# Patient Record
Sex: Female | Born: 1960
Health system: Southern US, Community
[De-identification: ages and names within clinical notes are randomized; demographics above are authoritative.]

## PROBLEM LIST (undated history)

## (undated) DIAGNOSIS — F329 Major depressive disorder, single episode, unspecified: Secondary | ICD-10-CM

## (undated) DIAGNOSIS — K219 Gastro-esophageal reflux disease without esophagitis: Secondary | ICD-10-CM

## (undated) DIAGNOSIS — Z9889 Other specified postprocedural states: Secondary | ICD-10-CM

## (undated) DIAGNOSIS — O039 Complete or unspecified spontaneous abortion without complication: Secondary | ICD-10-CM

## (undated) DIAGNOSIS — K2270 Barrett's esophagus without dysplasia: Secondary | ICD-10-CM

## (undated) DIAGNOSIS — F419 Anxiety disorder, unspecified: Secondary | ICD-10-CM

## (undated) DIAGNOSIS — K279 Peptic ulcer, site unspecified, unspecified as acute or chronic, without hemorrhage or perforation: Secondary | ICD-10-CM

## (undated) DIAGNOSIS — K7689 Other specified diseases of liver: Secondary | ICD-10-CM

## (undated) DIAGNOSIS — F32A Depression, unspecified: Secondary | ICD-10-CM

## (undated) DIAGNOSIS — T7840XA Allergy, unspecified, initial encounter: Secondary | ICD-10-CM

## (undated) DIAGNOSIS — N289 Disorder of kidney and ureter, unspecified: Secondary | ICD-10-CM

## (undated) HISTORY — DX: Allergy, unspecified, initial encounter: T78.40XA

## (undated) HISTORY — PX: TONSILLECTOMY: SUR1361

## (undated) HISTORY — PX: BLADDER SUSPENSION: SHX72

## (undated) HISTORY — DX: Barrett's esophagus without dysplasia: K22.70

## (undated) HISTORY — DX: Major depressive disorder, single episode, unspecified: F32.9

## (undated) HISTORY — DX: Gastro-esophageal reflux disease without esophagitis: K21.9

## (undated) HISTORY — DX: Anxiety disorder, unspecified: F41.9

## (undated) HISTORY — DX: Other specified postprocedural states: Z98.890

## (undated) HISTORY — DX: Peptic ulcer, site unspecified, unspecified as acute or chronic, without hemorrhage or perforation: K27.9

## (undated) HISTORY — DX: Depression, unspecified: F32.A

## (undated) HISTORY — DX: Complete or unspecified spontaneous abortion without complication: O03.9

---
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
## 1997-08-11 ENCOUNTER — Ambulatory Visit (HOSPITAL_COMMUNITY): Admission: RE | Admit: 1997-08-11 | Discharge: 1997-08-11 | Payer: Self-pay | Admitting: Obstetrics and Gynecology

## 1999-05-04 ENCOUNTER — Encounter: Admission: RE | Admit: 1999-05-04 | Discharge: 1999-05-04 | Payer: Self-pay | Admitting: Gastroenterology

## 1999-05-04 ENCOUNTER — Encounter: Payer: Self-pay | Admitting: Gastroenterology

## 1999-09-30 ENCOUNTER — Encounter: Payer: Self-pay | Admitting: Emergency Medicine

## 1999-09-30 ENCOUNTER — Emergency Department (HOSPITAL_COMMUNITY): Admission: EM | Admit: 1999-09-30 | Discharge: 1999-09-30 | Payer: Self-pay | Admitting: Emergency Medicine

## 1999-10-04 ENCOUNTER — Ambulatory Visit (HOSPITAL_COMMUNITY): Admission: RE | Admit: 1999-10-04 | Discharge: 1999-10-04 | Payer: Self-pay | Admitting: Urology

## 1999-10-04 ENCOUNTER — Encounter: Payer: Self-pay | Admitting: Urology

## 1999-10-08 ENCOUNTER — Encounter: Payer: Self-pay | Admitting: Urology

## 1999-10-08 ENCOUNTER — Ambulatory Visit (HOSPITAL_COMMUNITY): Admission: RE | Admit: 1999-10-08 | Discharge: 1999-10-08 | Payer: Self-pay | Admitting: Urology

## 2000-09-10 ENCOUNTER — Other Ambulatory Visit: Admission: RE | Admit: 2000-09-10 | Discharge: 2000-09-10 | Payer: Self-pay | Admitting: Obstetrics and Gynecology

## 2001-10-08 ENCOUNTER — Other Ambulatory Visit: Admission: RE | Admit: 2001-10-08 | Discharge: 2001-10-08 | Payer: Self-pay | Admitting: Obstetrics and Gynecology

## 2002-03-25 HISTORY — PX: ABDOMINAL HYSTERECTOMY: SHX81

## 2002-10-07 ENCOUNTER — Ambulatory Visit (HOSPITAL_COMMUNITY): Admission: RE | Admit: 2002-10-07 | Discharge: 2002-10-07 | Payer: Self-pay | Admitting: Obstetrics and Gynecology

## 2002-10-07 ENCOUNTER — Encounter (INDEPENDENT_AMBULATORY_CARE_PROVIDER_SITE_OTHER): Payer: Self-pay | Admitting: Specialist

## 2002-10-12 ENCOUNTER — Other Ambulatory Visit: Admission: RE | Admit: 2002-10-12 | Discharge: 2002-10-12 | Payer: Self-pay | Admitting: Obstetrics and Gynecology

## 2003-05-19 ENCOUNTER — Observation Stay (HOSPITAL_COMMUNITY): Admission: RE | Admit: 2003-05-19 | Discharge: 2003-05-20 | Payer: Self-pay | Admitting: Obstetrics and Gynecology

## 2003-05-19 ENCOUNTER — Encounter (INDEPENDENT_AMBULATORY_CARE_PROVIDER_SITE_OTHER): Payer: Self-pay | Admitting: *Deleted

## 2003-12-20 ENCOUNTER — Other Ambulatory Visit: Admission: RE | Admit: 2003-12-20 | Discharge: 2003-12-20 | Payer: Self-pay | Admitting: Obstetrics and Gynecology

## 2005-01-08 ENCOUNTER — Other Ambulatory Visit: Admission: RE | Admit: 2005-01-08 | Discharge: 2005-01-08 | Payer: Self-pay | Admitting: Obstetrics and Gynecology

## 2007-02-17 ENCOUNTER — Encounter: Admission: RE | Admit: 2007-02-17 | Discharge: 2007-02-17 | Payer: Self-pay | Admitting: Neurology

## 2007-02-20 ENCOUNTER — Encounter: Admission: RE | Admit: 2007-02-20 | Discharge: 2007-02-20 | Payer: Self-pay | Admitting: Neurology

## 2007-07-15 ENCOUNTER — Ambulatory Visit (HOSPITAL_COMMUNITY): Admission: RE | Admit: 2007-07-15 | Discharge: 2007-07-15 | Payer: Self-pay | Admitting: Internal Medicine

## 2008-09-08 ENCOUNTER — Encounter: Admission: RE | Admit: 2008-09-08 | Discharge: 2008-09-08 | Payer: Self-pay | Admitting: Obstetrics and Gynecology

## 2010-08-10 NOTE — Op Note (Signed)
NAME:  Haley Sparks, Haley Sparks                       ACCOUNT NO.:  [id number]   MEDICAL RECORD NO.:  [id number]                   PATIENT TYPE:  OBV   LOCATION:  9399                                 FACILITY:  [facility]   PHYSICIAN:  Dineen Kid. Rana Snare, M.D.                 DATE OF BIRTH:  [birth year]   DATE OF PROCEDURE:  05/19/2003  DATE OF DISCHARGE:                                 OPERATIVE REPORT   PREOPERATIVE DIAGNOSES:  1. Menometrorrhagia.  2. Pelvic pain.  3. Dyspareunia.  4. History of endometriosis.   POSTOPERATIVE DIAGNOSES:  1. Menometrorrhagia.  2. Pelvic pain.  3. Dyspareunia.  4. History of endometriosis.   PROCEDURE:  Laparoscopically-assisted vaginal hysterectomy.   SURGEON:  Dineen Kid. Rana Snare, M.D.   ASSISTANT:  Tracie Harrier, M.D.   ANESTHESIA:  General endotracheal.   INDICATIONS:  Haley Sparks is a 50 year old G3, P2, A1, status post tubal  ligation, who has had worsening menometrorrhagia and cramping over the last  six months to a year.  She now has pain approximately two weeks before her  period, including severe cramping, and the patient also has heavy and  irregular periods and pain with intercourse.  She has had a history of  endometriosis in the past and has had laparoscopies for this.  Does have a  history of endometrial polyps and had a D&C for this in the past.  Has had a  recent sonohysterogram, which showed a normal endometrial lining, and has  been on blood pressure in hopes of controlling the pain and bleeding but has  been unresponsive.  She desires definitive surgical evaluation and  treatment, planned laparoscopically-assisted vaginal hysterectomy with  possible removal of one of her ovaries.  Risks and benefits were discussed  at length, which include but are not limited to risk of infection, bleeding,  damage, to bowel, bladder, ureters, ovaries, not being able to alleviate the  pain, or recurrence or worsening of the pain.  Risks associated with  anesthesia and risks associated with blood loss were also discussed.  Informed consent was obtained.   FINDINGS AT THE TIME OF SURGERY:  A normal-appearing uterus has been  suspended from the round ligaments from previous surgeries.  Minimal amount  of endometriosis along the right broad ligament.  Right and left ovaries  were within normal limits.  The cul-de-sac was also normal, as was the  liver.   DESCRIPTION OF PROCEDURE:  After adequate analgesia, the patient placed in  the dorsal lithotomy position, she was sterilely prepped and draped, the  bladder was sterilely drained.  A Hulka tenaculum was placed, a 1 cm  infraumbilical skin incision was made, the Veress needle was inserted, the  abdomen was insufflated to dullness to percussion.  An 11 mm trocar was  inserted.  A 5 mm trocar was inserted to the left of the midline two  fingerbreadths from the pubic symphysis.  The  above findings were noted.  A  Gyrus tripolar was used to coagulate across the round ligaments and the  utero-ovarian ligaments bilaterally, taking the broad ligaments down to the  edge of the bladder flap.  The bladder flap was created and dissected off  the anterior surface of the uterus.  The ovaries had fallen laterally away  from the uterus with good hemostasis achieved.  The areas of endometriosis  along the right broad ligament were desiccated in the process of dissecting  through the broad ligament along the right side.  The legs were  repositioned, a weighted speculum was placed.  The Jacobs tenaculum was  placed in the cervix.  A posterior colpotomy was performed.  The cervix was  circumscribed with Bovie cautery and a LigaSure ligator was used to clamp  the uterosacral ligaments bilaterally and resected with Mayo scissors.  The  bladder pillars were dissected bilaterally.  The bladder was dissected off  the anterior surface of the cervix and a Deaver retractor was placed below  the bladder.  Successive  bites across the cardinal ligaments up to the  uterine vasculature with the LigaSure were performed.  Mayo scissors were  used to dissect.  This was performed until the uterus was removed intact and  good hemostasis was achieved.  A ribbon packing was placed.  The uterosacral  ligaments were re-identified, suture ligated with 0 Monocryl figure-of-  eights.  The posterior peritoneum was closed in a pursestring fashion with 0  Monocryl suture and the posterior vaginal mucosa was closed in a vertical  fashion using figure-of-eights of 0 Monocryl.  The ribbon pack was removed  and the anterior vaginal mucosa was closed in a similar fashion with figure-  of-eights of 0 Monocryl in a vertical fashion with good approximation and  good hemostasis achieved.  The Foley catheter was placed with return of  clear yellow urine.  The legs were repositioned.  The abdomen was  reinsufflated and careful examination of the pelvis and cul-de-sac revealed  good hemostasis.  Some areas of peritoneal oozing were coagulated with the  bipolar and after hemostasis was assured, the abdomen was desufflated, the  trocars were removed.  The infraumbilical skin incision was closed with a 0  Vicryl interrupted suture in the fascia, 3-0 Vicryl Rapide subcuticular  stitch, and the 5 mm site was closed with 3-0 Vicryl Rapide subcuticular  stitch.  The incisions were injected with 0.25% Marcaine.  The patient was  stable on transfer to the recovery room.  The sponge and instrument count  was normal x3.  Estimated blood loss for the procedure was 100 mL.  The  patient received 1 g of Rocephin preoperatively.                                               Dineen Kid Rana Snare, M.D.    DCL/MEDQ  D:  05/19/2003  T:  05/19/2003  Job:  [id number]

## 2010-08-10 NOTE — H&P (Signed)
   NAME:  Haley Sparks, CAPELL                       ACCOUNT NO.:  [id number]   MEDICAL RECORD NO.:  [id number]                   PATIENT TYPE:  AMB   LOCATION:  SDC                                  FACILITY:  [facility]   PHYSICIAN:  Dineen Kid. Rana Snare, M.D.                 DATE OF BIRTH:  1960-07-10   DATE OF ADMISSION:  10/07/2002  DATE OF DISCHARGE:                                HISTORY & PHYSICAL   HISTORY OF PRESENT ILLNESS:  The patient is a 50 year old G3, P2; status  post tubal ligation.  She has been having problems with iron deficiency  anemia, also reporting that her periods over the last several months have  been heavy with multiple clots; lasting 5-7 days happening every 21-28 days.  Her hemoglobin was 9.3.  After discussing different options, she would  rather not be on birth control pills.   She underwent a sonohistogram.  The sonohistogram shows a probable  endometrial polyp measuring  4.7 mm in size at the fundus of the uterus.  She presents today for  hysteroscopy and D&C for evaluation and treatment of this.   PAST MEDICAL HISTORY:  1. History of endometriosis.  2. Hypertension.   PAST SURGICAL HISTORY:  1. Tubal ligation.  2. Tonsillectomy.  3. Several bladder surgeries.  4. Uterus extension.  5. Surgeries for endometriosis.  6. Cesarean section.   MEDICATIONS:  Currently on Wellbutrin and Xanax.   PHYSICAL EXAMINATION:  VITAL SIGNS:  Blood pressure 100/60.  HEART:  Regular rate and rhythm.  LUNGS:  Clear to auscultation bilaterally.  ABDOMEN:  Nondistended and nontender.  PELVIC:  The uterus is anteverted, mobile and nontender.  No adnexal masses  are palpable.   IMPRESSION AND PLAN:  Abnormal bleeding, anemia and endometrial mass  consistent with a polyp.  Recommend a hysteroscopy and D&C for evaluation  and removal of this polyp.  The risks and benefits were discussed at length,  and this included but not limited to: risks of infection, bleeding; damage  to  uterus, tubes, ovaries, bowel or bladder; possibility of recurrence of  the bleeding or not be able to ameliorate the bleeding.  She does confirm  informed consent.                                               Dineen Kid Rana Snare, M.D.   DCL/MEDQ  D:  10/07/2002  T:  10/07/2002  Job:  [id number]

## 2010-08-10 NOTE — H&P (Signed)
NAME:  Haley Sparks, Haley Sparks                       ACCOUNT NO.:  [id number]   MEDICAL RECORD NO.:  [id number]                   PATIENT TYPE:  OBV   LOCATION:  NA                                   [facility]   PHYSICIAN:  Dineen Kid. Rana Snare, M.D.                 DATE OF BIRTH:  27-Oct-1960   DATE OF ADMISSION:  DATE OF DISCHARGE:                                HISTORY & PHYSICAL   DATE OF ADMISSION:  May 19, 2003   HISTORY OF PRESENT ILLNESS:  Haley Sparks is a 50 year old G3 P2 A1 with  worsening menometrorrhagia and cramping.  She has pain approximately 2 weeks  before her period and severe cramping during her period.  Continues to have  heavy periods and also pain with intercourse.  She does have a history of  endometriosis and has had laparoscopies in the past for this.  She does have  a history of endometrial polyps and had a D&C for this in the past.  Recently started on birth control pills in hopes of controlling the pain and  bleeding but has been unresponsive to this.  At this time she is desiring  more definitive surgical treatment and presents for hysterectomy.  She also  has a history of iron deficiency anemia and currently on iron, has a  hemoglobin of 12.9.   PAST MEDICAL HISTORY:  Significant for history of endometriosis.  She has  had several laparoscopies for this.  She has also had several bladder  surgeries for urinary incontinence, a tubal ligation, tonsillectomy,  cesarean section, a uterine suspension, and also a D&C for endometrial  polyp.  Past medical history is supposedly history of hypertension; however,  she has had normal blood pressures for the last number of years.  She also  has a history of irritable bowel syndrome and anxiety.   MEDICATIONS:  Currently she is on Wellbutrin and Zelnorm.   ALLERGIES:  No known drug allergies.   PHYSICAL EXAMINATION:  VITAL SIGNS:  Blood pressure 110/70.  HEART:  Regular rate and rhythm.  LUNGS:  Clear to  auscultation bilaterally.  ABDOMEN:  Nondistended, nontender.  PELVIC:  The uterus is anteverted, mobile, nontender, and no adnexal masses  are palpable.   Ultrasound performed in May 2004 shows a 9.7 x 3.1 x 6.1 cm uterus, normal-  appearing ovaries.   IMPRESSION AND PLAN:  Menometrorrhagia, pelvic pain, dyspareunia, and  history of endometriosis not responsive to conservative medical management.  The patient desires definitive surgical treatment.   PLAN:  Hysterectomy.  Will plan laparoscopy-assisted vaginal hysterectomy  with probable unilateral salpingo-oophorectomy.  The patient has most of her  discomfort on the right side, desires right salpingo-oophorectomy if the  judgment at the time of surgery is that that would help her with the pain.  She does desire to preserve one ovary if at all possible.  The risks and  benefits of the procedure  were discussed at length which include but are not  limited to risk of infection; bleeding; damage to ureters, ovaries, bowel,  bladder; risks associated with anesthesia; risks associated with blood  transfusion.  She understands that if unable to achieve this vaginally that  laparotomy will be necessary.  She does understand if both ovaries needs to  be removed based on a decision made at surgery that she  would be menopausal and understands the risks associated with menopause as  well as hormone replacement therapy.  She understands the risks associated  with blood transfusion and also understands that the pain may or may not get  better after the surgery.  She does give her informed consent.                                               Dineen Kid Rana Snare, M.D.    DCL/MEDQ  D:  05/17/2003  T:  05/17/2003  Job:  [id number]

## 2010-08-10 NOTE — Op Note (Signed)
[place]  Patient:    Haley Sparks, Haley Sparks                    MRN: [id number] Proc. Date: 10/04/99 Adm. Date:  [id number] Disc. Date: [id number] Attending:  Katherine Roan                           Operative Report  PREOPERATIVE DIAGNOSIS:  Proximal left ureteral calculus 4 x 3 mm.  POSTOPERATIVE DIAGNOSIS:  Proximal left ureteral calculus 4 x 3 mm.  PROCEDURE:  Cystoureteral stent placement.  ANESTHESIA:  General anesthesia.  SURGEON:  Rozanna Boer., M.D.  INDICATIONS:  This 49 year old white female presented on July 10 with an obstructing left ureteral stone that has not progressed.  She was to have lithotripsy today, but the machine malfunctioned and the procedure had to be canceled.  Because of persistent pain, she enters now for cystostent placement nd before the lithotripsy could be done on July 17.  DESCRIPTION OF PROCEDURE:  The patient was placed on the operating table in the  dorsal lithotomy position after satisfactory induction of general endotracheal anesthesia, was prepped and draped with Betadine in the usual sterile fashion. A #21 panendoscope was inserted and the bladder was inspected and found to be free of any lesions.  The left orifice was catheterized with a 6 French x 24 cm left ureteral stent with the guide wire inserted through it, passed under fluoroscopy up to the level of the kidney where the stone was manipulated back into the renal pelvis.  The guide wire was removed, the stent was in good position, and the guide wire was then removed and the bladder drained.  The scope was then removed. The patient was taken to the recovery room and will be later discharged as an outpatient. DD:  10/04/99 TD:  10/04/99 Job: [id number]

## 2010-08-10 NOTE — Discharge Summary (Signed)
NAME:  Haley Sparks, Haley Sparks                       ACCOUNT NO.:  [id number]   MEDICAL RECORD NO.:  [id number]                   PATIENT TYPE:  OBV   LOCATION:  9316                                 FACILITY:  [facility]   PHYSICIAN:  Dineen Kid. Rana Snare, M.D.                 DATE OF BIRTH:  July 08, 1960   DATE OF ADMISSION:  05/19/2003  DATE OF DISCHARGE:                                 DISCHARGE SUMMARY   HISTORY OF PRESENT ILLNESS:  [name] is a 50 year old G3 P2 A1 with  worsening menometrorrhagia and pelvic pain, off and on bleeding which has  not been controlled with birth control pills.  She also has severe periods  of cramping for approximately 2 weeks before her period, continues through  her period, and also pain with intercourse.  Does have a history of  endometriosis, has no childbearing desires and presents for hysterectomy.   HOSPITAL COURSE:  The patient underwent a laparoscopy-assisted vaginal  hysterectomy.  Findings at the time of surgery were some mild endometriosis,  otherwise normal-appearing anatomy.  The uterus was easily removed with  estimated blood loss of 100 mL and the surgery was uncomplicated.  Her  postoperative care also was uncomplicated with good return of bowel function  and was ambulating well and tolerating a regular diet on postoperative day  #1.  The abdomen was soft, nontender, nondistended, with clean, dry, and  intact incisions and normal active bowel sounds.  The patient was discharged  home.  Will follow up in the office in 2-3 weeks.   DISPOSITION:  The patient will follow up in the office in 2-3 weeks.  Sent  home with a prescription for Tylox #30.  Told to return for increased pain,  fever, or bleeding.                                               Dineen Kid Rana Snare, M.D.    DCL/MEDQ  D:  05/20/2003  T:  05/20/2003  Job:  [id number]

## 2010-08-10 NOTE — Op Note (Signed)
NAME:  Haley Sparks, Haley Sparks                       ACCOUNT NO.:  [id number]   MEDICAL RECORD NO.:  [id number]                   PATIENT TYPE:  AMB   LOCATION:  SDC                                  [facility]ACILITY:  [facility]   PHYSICIAN:  Dineen Kid. Rana Snare, M.D.                 DATE OF BIRTH:  1960/05/09   DATE OF PROCEDURE:  10/07/2002  DATE OF DISCHARGE:                                 OPERATIVE REPORT   PREOPERATIVE DIAGNOSES:  1. Abnormal uterine bleeding.  2. Endometrial mass.   POSTOPERATIVE DIAGNOSES:  1. Abnormal uterine bleeding.  2. Endometrial mass.   PROCEDURE:  Hysteroscopy dilatation and curettage.   SURGEON:  Dineen Kid. Rana Snare, M.D.   ANESTHESIA:  Monitored anesthetic care and paracervical block.   ESTIMATED BLOOD LOSS:  Less than 10 mL.   SORBITOL DEFICIT:  30 mL.   HISTORY OF PRESENT ILLNESS:  Ms. Gallaga is a 50 year old G3, P2, A1, status  post tubal ligation with problems with iron deficiency anemia reporting  heavier menses of about several months, clotting, and irregular bleeding.  Underwent a  sonohysterogram which shows an endometrial polyp measuring 4.7  mm in size.  She presents for hysteroscopy D&C to evaluate lesion and  treatment of this.  Risks and benefits were discussed, informed consent was  obtained.   FINDINGS AT TIME OF SURGERY:  Posterior endometrial wall polyp.  Otherwise  normal appearing ostia and cervix.   DESCRIPTION OF PROCEDURE:  After adequate analgesia, the patient was placed  in the dorsal lithotomy position.  She was sterilely prepped and draped, her  bladder sterilely drained.  A speculum was placed.  Xylocaine 1% was  injected into the anterior lip of the cervix, a tenaculum was placed, and a  paracervical block was placed with 1% Xylocaine with 1:100,000 epinephrine  injected at 5 and 7 o'clock.  A total of 20 mL used.  The uterus was sounded  to 8 cm, easily dilated to a #27 Pratt dilator.  The hysteroscope was  inserted and the above  findings were noted.  Polyp forceps were used to  grasp the endometrial polyp and curettage was performed until a gritty  surface was felt throughout the endometrial cavity.  This was followed by  hysteroscopy revealing no residual polypoid appearing tissue and normal  appearing ostia and cervix.  The hysteroscope was then removed.  The  tenaculum removed from the anterior lip of the cervix and noted to be  hemostatic.  The speculum was then removed and the patient was transferred  to the recovery room in stable condition.  Estimated blood loss less than 10  mL.  Sorbitol deficit 30 mL.  The patient received Toradol 30 mg IV in the  operating room.    DISPOSITION:  The patient will be discharged home with follow up in the  office in 2-3 weeks and given routine instruction sheet for D&C.  Told to  return for increased pain, fever, or bleeding.                                               Dineen Kid Rana Snare, M.D.    DCL/MEDQ  D:  10/07/2002  T:  10/07/2002  Job:  [id number]

## 2010-08-29 ENCOUNTER — Other Ambulatory Visit (HOSPITAL_COMMUNITY): Payer: Self-pay | Admitting: Internal Medicine

## 2010-08-29 ENCOUNTER — Ambulatory Visit (HOSPITAL_COMMUNITY)
Admission: RE | Admit: 2010-08-29 | Discharge: 2010-08-29 | Disposition: A | Discharge status: Home / Self Care | Payer: BC Managed Care – PPO | Source: Ambulatory Visit | Attending: Internal Medicine | Admitting: Internal Medicine

## 2010-08-29 DIAGNOSIS — R059 Cough, unspecified: Secondary | ICD-10-CM | POA: Insufficient documentation

## 2010-08-29 DIAGNOSIS — R05 Cough: Secondary | ICD-10-CM | POA: Insufficient documentation

## 2010-08-29 DIAGNOSIS — R079 Chest pain, unspecified: Secondary | ICD-10-CM | POA: Insufficient documentation

## 2011-04-12 ENCOUNTER — Other Ambulatory Visit: Payer: Self-pay | Admitting: Obstetrics and Gynecology

## 2011-04-12 DIAGNOSIS — R928 Other abnormal and inconclusive findings on diagnostic imaging of breast: Secondary | ICD-10-CM

## 2011-04-12 LAB — HM MAMMOGRAPHY: HM Mammogram: NORMAL

## 2011-04-22 ENCOUNTER — Ambulatory Visit
Admission: RE | Admit: 2011-04-22 | Discharge: 2011-04-22 | Disposition: A | Discharge status: Home / Self Care | Payer: BC Managed Care – PPO | Source: Ambulatory Visit | Attending: Obstetrics and Gynecology | Admitting: Obstetrics and Gynecology

## 2011-04-22 DIAGNOSIS — R928 Other abnormal and inconclusive findings on diagnostic imaging of breast: Secondary | ICD-10-CM

## 2012-01-02 ENCOUNTER — Ambulatory Visit
Admission: RE | Admit: 2012-01-02 | Discharge: 2012-01-02 | Disposition: A | Discharge status: Home / Self Care | Payer: BC Managed Care – PPO | Source: Ambulatory Visit | Attending: Internal Medicine | Admitting: Internal Medicine

## 2012-01-02 ENCOUNTER — Other Ambulatory Visit: Payer: Self-pay | Admitting: Internal Medicine

## 2012-01-02 DIAGNOSIS — R609 Edema, unspecified: Secondary | ICD-10-CM

## 2012-01-02 DIAGNOSIS — R52 Pain, unspecified: Secondary | ICD-10-CM

## 2012-10-05 ENCOUNTER — Other Ambulatory Visit: Payer: Self-pay

## 2013-02-01 ENCOUNTER — Ambulatory Visit: Payer: BC Managed Care – PPO | Admitting: Physician Assistant

## 2013-02-01 ENCOUNTER — Encounter: Payer: Self-pay | Admitting: Physician Assistant

## 2013-02-01 VITALS — BP 128/70 | HR 88 | Temp 98.8°F | Resp 16 | Wt 137.0 lb

## 2013-02-01 DIAGNOSIS — R5381 Other malaise: Secondary | ICD-10-CM

## 2013-02-01 DIAGNOSIS — J329 Chronic sinusitis, unspecified: Secondary | ICD-10-CM

## 2013-02-01 LAB — CBC WITH DIFFERENTIAL/PLATELET
Basophils Absolute: 0 10*3/uL (ref 0.0–0.1)
Eosinophils Relative: 0 % (ref 0–5)
Lymphocytes Relative: 11 % — ABNORMAL LOW (ref 12–46)
Neutro Abs: 8.4 10*3/uL — ABNORMAL HIGH (ref 1.7–7.7)
Platelets: 250 10*3/uL (ref 150–400)
RDW: 12.8 % (ref 11.5–15.5)
WBC: 10.1 10*3/uL (ref 4.0–10.5)

## 2013-02-01 MED ORDER — BACLOFEN 10 MG PO TABS: 10.0000 mg | ORAL_TABLET | Freq: Three times a day (TID) | ORAL | Status: AC

## 2013-02-01 MED ORDER — AZITHROMYCIN 250 MG PO TABS: ORAL_TABLET | ORAL | Status: AC

## 2013-02-01 NOTE — Progress Notes (Signed)
  Subjective:    Patient ID: Haley Sparks, female    DOB: [birth year], 52 y.o.   MRN: [id number]  Sinusitis This is a new problem. The current episode started in the past 7 days. The problem has been gradually worsening since onset. Associated symptoms include congestion, ear pain, headaches (Frontal headache and gum pain) and sinus pressure. Pertinent negatives include no chills, coughing or diaphoresis. Treatments tried: When to urgent care on Friday and got two shots and Prednisone dose pack. The treatment provided no relief.     Medication List       This list is accurate as of: 02/01/13  3:28 PM.  Always use your most recent med list.               ALPRAZolam 0.5 MG tablet  Commonly known as:  XANAX  Take 0.5 mg by mouth at bedtime as needed for anxiety.     AMITIZA 24 MCG capsule  Generic drug:  lubiprostone  Take 24 mcg by mouth 2 (two) times daily with a meal.     azithromycin 250 MG tablet  Commonly known as:  ZITHROMAX  Take 2 tablets (500 mg) on  Day 1,  followed by 1 tablet (250 mg) once daily on Days 2 through 5.     baclofen 10 MG tablet  Commonly known as:  LIORESAL  Take 1 tablet (10 mg total) by mouth 3 (three) times daily.     buPROPion 150 MG 24 hr tablet  Commonly known as:  WELLBUTRIN XL  Take 150 mg by mouth 2 (two) times daily.     escitalopram 20 MG tablet  Commonly known as:  LEXAPRO  Take 20 mg by mouth daily.       No past medical history on file.   Review of Systems  Constitutional: Positive for fatigue. Negative for chills and diaphoresis.  HENT: Positive for congestion, ear pain and sinus pressure. Negative for facial swelling, hearing loss and postnasal drip.   Eyes: Negative.   Respiratory: Negative.  Negative for cough.   Cardiovascular: Negative.   Gastrointestinal: Negative.   Neurological: Positive for headaches (Frontal headache and gum pain). Negative for dizziness.       Objective:   Physical Exam  Constitutional: She  appears well-developed and well-nourished.  HENT:  Head: Normocephalic and atraumatic.  Right Ear: External ear normal. Tympanic membrane is not perforated, not erythematous and not retracted. A middle ear effusion is present.  Left Ear: External ear normal. Tympanic membrane is not perforated, not erythematous and not retracted. A middle ear effusion is present.  Nose: Sinus tenderness present.  Mouth/Throat: Uvula is midline and mucous membranes are normal. Posterior oropharyngeal erythema present. No posterior oropharyngeal edema or tonsillar abscesses.  + left TMJ tenderness.   Cardiovascular: Normal rate, regular rhythm and normal heart sounds.   Pulmonary/Chest: Effort normal and breath sounds normal.  Abdominal: Soft. Bowel sounds are normal.  Skin: Skin is warm and dry.       Assessment & Plan:   Fatigue- + sinus pressure- Patient will continue the prednisone, zpak given.  Will check labs to rule out anemia, dehydration, mono. Rapid Flu negative.   TMJ- left sided TMJ. Baclofen given, do soft foods.   Follow up if not better in 1 week.

## 2013-02-01 NOTE — Patient Instructions (Signed)

## 2013-02-02 LAB — BASIC METABOLIC PANEL WITH GFR
Chloride: 102 mEq/L (ref 96–112)
Creat: 0.64 mg/dL (ref 0.50–1.10)
GFR, Est Non African American: >89 mL/min
Potassium: 4.1 mEq/L (ref 3.5–5.3)

## 2013-02-02 LAB — HEPATIC FUNCTION PANEL
Albumin: 4.2 g/dL (ref 3.5–5.2)
Alkaline Phosphatase: 46 U/L (ref 39–117)
Total Bilirubin: 0.4 mg/dL (ref 0.3–1.2)

## 2013-02-02 LAB — EPSTEIN-BARR VIRUS VCA ANTIBODY PANEL
EBV EA IgG: <5 U/mL (ref <9.0)
EBV VCA IgG: 705 U/mL — ABNORMAL HIGH (ref <18.0)
EBV VCA IgM: <10 U/mL (ref <36.0)

## 2013-03-01 ENCOUNTER — Encounter: Payer: Self-pay | Admitting: Internal Medicine

## 2013-03-01 ENCOUNTER — Telehealth: Payer: Self-pay | Admitting: Internal Medicine

## 2013-03-01 DIAGNOSIS — F419 Anxiety disorder, unspecified: Secondary | ICD-10-CM | POA: Insufficient documentation

## 2013-03-01 DIAGNOSIS — F339 Major depressive disorder, recurrent, unspecified: Secondary | ICD-10-CM | POA: Insufficient documentation

## 2013-03-01 DIAGNOSIS — Z9109 Other allergy status, other than to drugs and biological substances: Secondary | ICD-10-CM | POA: Insufficient documentation

## 2013-03-01 DIAGNOSIS — F325 Major depressive disorder, single episode, in full remission: Secondary | ICD-10-CM | POA: Insufficient documentation

## 2013-03-01 NOTE — Telephone Encounter (Signed)
Pt said she finally went back to work,and is just misable with severe headache and inner ear pain, unbalanced feeling?  Please advise if ov needed.

## 2013-03-01 NOTE — Telephone Encounter (Signed)
Patient scheduled OV.

## 2013-03-01 NOTE — Telephone Encounter (Signed)
Suggest OV- should be feeling better with mono

## 2013-03-02 ENCOUNTER — Encounter: Payer: Self-pay | Admitting: Physician Assistant

## 2013-03-02 ENCOUNTER — Ambulatory Visit (INDEPENDENT_AMBULATORY_CARE_PROVIDER_SITE_OTHER): Payer: BC Managed Care – PPO | Admitting: Physician Assistant

## 2013-03-02 VITALS — BP 119/62 | HR 84 | Temp 98.1°F | Resp 16 | Wt 135.0 lb

## 2013-03-02 DIAGNOSIS — H8110 Benign paroxysmal vertigo, unspecified ear: Secondary | ICD-10-CM

## 2013-03-02 DIAGNOSIS — R5381 Other malaise: Secondary | ICD-10-CM

## 2013-03-02 DIAGNOSIS — Z79899 Other long term (current) drug therapy: Secondary | ICD-10-CM

## 2013-03-02 LAB — HEPATIC FUNCTION PANEL
AST: 19 U/L (ref 0–37)
Alkaline Phosphatase: 47 U/L (ref 39–117)
Bilirubin, Direct: 0.1 mg/dL (ref 0.0–0.3)
Indirect Bilirubin: 0.6 mg/dL (ref 0.0–0.9)
Total Bilirubin: 0.7 mg/dL (ref 0.3–1.2)

## 2013-03-02 LAB — BASIC METABOLIC PANEL WITH GFR
CO2: 27 mEq/L (ref 19–32)
Chloride: 103 mEq/L (ref 96–112)
Potassium: 4 mEq/L (ref 3.5–5.3)
Sodium: 137 mEq/L (ref 135–145)

## 2013-03-02 LAB — IRON AND TIBC
Iron: 145 ug/dL (ref 42–145)
TIBC: 289 ug/dL (ref 250–470)

## 2013-03-02 LAB — CBC WITH DIFFERENTIAL/PLATELET
Hemoglobin: 12.5 g/dL (ref 12.0–15.0)
Lymphocytes Relative: 42 % (ref 12–46)
Lymphs Abs: 2.6 10*3/uL (ref 0.7–4.0)
Monocytes Relative: 11 % (ref 3–12)
Neutrophils Relative %: 44 % (ref 43–77)
Platelets: 257 10*3/uL (ref 150–400)
RBC: 3.81 MIL/uL — ABNORMAL LOW (ref 3.87–5.11)

## 2013-03-02 LAB — MAGNESIUM: Magnesium: 2 mg/dL (ref 1.5–2.5)

## 2013-03-02 LAB — SEDIMENTATION RATE: Sed Rate: 4 mm/hr (ref 0–22)

## 2013-03-02 MED ORDER — MECLIZINE HCL 25 MG PO TABS: 25.0000 mg | ORAL_TABLET | Freq: Three times a day (TID) | ORAL | Status: DC | PRN

## 2013-03-02 MED ORDER — DIAZEPAM 5 MG PO TABS: 5.0000 mg | ORAL_TABLET | Freq: Three times a day (TID) | ORAL | Status: AC | PRN

## 2013-03-02 NOTE — Progress Notes (Signed)
HPI Patient presents for a one month follow up. She had a + mono at her last visit a month ago on 02/01/2013. She has been out of work since that time and she has been at work this week but states she is not feeling any better. She states that she continues to have fatigue, feels very swimmy headed/vertigo. When she walks she was to walk slowly because if she turns quickly she gets vertigo. She has waves of nausea, decreased appetitie. She has very tight neck and shoulder muscles. Denies fever, chills, vomiting, SOB, CP.   Past Medical History  Diagnosis Date  . Allergy   . Anxiety   . Depression   . History of bladder surgery      No Known Allergies    Current Outpatient Prescriptions on File Prior to Visit  Medication Sig Dispense Refill  . ALPRAZolam (XANAX) 0.5 MG tablet Take 0.5 mg by mouth at bedtime as needed for anxiety.      . baclofen (LIORESAL) 10 MG tablet Take 1 tablet (10 mg total) by mouth 3 (three) times daily.  30 tablet  1  . buPROPion (WELLBUTRIN XL) 150 MG 24 hr tablet Take 150 mg by mouth 2 (two) times daily.      . butalbital-acetaminophen-caffeine (FIORICET) 50-325-40 MG per tablet Take 2 tablets by mouth every 4 (four) hours as needed for headache.      . escitalopram (LEXAPRO) 20 MG tablet Take 20 mg by mouth daily.      Marland Kitchen lubiprostone (AMITIZA) 24 MCG capsule Take 24 mcg by mouth 2 (two) times daily with a meal.       No current facility-administered medications on file prior to visit.    ROS: all negative expect above.   Physical: Filed Weights   03/02/13 1038  Weight: 135 lb (61.236 kg)   Filed Vitals:   03/02/13 1038  BP: 119/62  Pulse: 84  Temp: 98.1 F (36.7 C)  Resp: 16   General Appearance: Well nourished, in no apparent distress. Eyes: PERRLA, EOMs. Sinuses: No Frontal/maxillary tenderness ENT/Mouth: Ext aud canals clear, normal light reflex with TMs without erythema, bulging. Post pharynx without erythema, swelling, exudate.   Respiratory: CTAB Cardio: RRR, no murmurs, rubs or gallops. Peripheral pulses brisk and equal bilaterally, without edema. No aortic or femoral bruits. Abdomen: Flat, soft, with bowl sounds. Nontender, no guarding, rebound. Lymphatics: Non tender without lymphadenopathy.  Musculoskeletal: Full ROM all peripheral extremities, 5/5 strength, and normal gait. Skin: Warm, dry without rashes, lesions, ecchymosis.  Neuro: Cranial nerves intact, reflexes equal bilaterally. Normal muscle tone, no cerebellar symptoms. Sensation intact. + Vertigo Pysch: Awake and oriented X 3, normal affect, Insight and Judgment appropriate.   Assessment and Plan: Fatigue- check labs  Vertigo- normal neurological exam Get labs, including urine to rule out other causes- questionably BPV Stop Xanax and try valium or meclizine.   Pending labs and disposition- will keep out of work until feeling better

## 2013-03-02 NOTE — Patient Instructions (Signed)
Infectious Mononucleosis Infectious mononucleosis (mono) is a common germ (viral) infection in children, teenagers, and young adults.  CAUSES  Mono is an infection caused by the Malachi Carl virus. The virus is spread by close personal contact with someone who has the infection. It can be passed by contact with your saliva through things such as kissing or sharing drinking glasses. Sometimes, the infection can be spread from someone who does not appear sick but still spreads the virus (asymptomatic carrier state).  SYMPTOMS  The most common symptoms of Mono are:  Sore throat.  Headache.  Fatigue.  Muscle aches.  Swollen glands.  Fever.  Poor appetite.  Enlarged liver or spleen. The less common symptoms can include:  Rash.  Feeling sick to your stomach (nauseous).  Abdominal pain. DIAGNOSIS  Mono is diagnosed by a blood test.  TREATMENT  Treatment of mono is usually at home. There is no medicine that cures this virus. Sometimes hospital treatment is needed in severe cases. Steroid medicine sometimes is needed if the swelling in the throat causes breathing or swallowing problems.  HOME CARE INSTRUCTIONS   Drink enough fluids to keep your urine clear or pale yellow.  Eat soft foods. Cool foods like popsicles or ice cream can soothe a sore throat.  Only take over-the-counter or prescription medicines for pain, discomfort, or fever as directed by your caregiver. Children under 22 years of age should not take aspirin.  Gargle salt water. This may help relieve your sore throat. Put 1 teaspoon (tsp) of salt in 1 cup of warm water. Sucking on hard candy may also help.  Rest as needed.  Start regular activities gradually after the fever is gone. Be sure to rest when tired.  Avoid strenuous exercise or contact sports until your caregiver says it is okay. The liver and spleen could be seriously injured.  Avoid sharing drinking glasses or kissing until your caregiver tells you  that you are no longer contagious. SEEK MEDICAL CARE IF:   Your fever is not gone after 7 days.  Your activity level is not back to normal after 2 weeks.  You have yellow coloring to eyes and skin (jaundice). SEEK IMMEDIATE MEDICAL CARE IF:   You have severe pain in the abdomen or shoulder.  You have trouble swallowing or drooling.  You have trouble breathing.  You develop a stiff neck.  You develop a severe headache.  You cannot stop throwing up (vomiting).  You have convulsions.  You are confused.  You have trouble with balance.  You develop signs of body fluid loss (dehydration):  Weakness.  Sunken eyes.  Pale skin.  Dry mouth.  Rapid breathing or pulse. MAKE SURE YOU:   Understand these instructions.  Will watch your condition.  Will get help right away if you are not doing well or get worse. Document Released: 03/08/2000 Document Revised: 06/03/2011 Document Reviewed: 01/05/2008 [facility] Patient Information 2014 [place], [place]. Vertigo Vertigo means you feel like you or your surroundings are moving when they are not. Vertigo can be dangerous if it occurs when you are at work, driving, or performing difficult activities.  CAUSES  Vertigo occurs when there is a conflict of signals sent to your brain from the visual and sensory systems in your body. There are many different causes of vertigo, including:  Infections, especially in the inner ear.  A bad reaction to a drug or misuse of alcohol and medicines.  Withdrawal from drugs or alcohol.  Rapidly changing positions, such as lying  down or rolling over in bed.  A migraine headache.  Decreased blood flow to the brain.  Increased pressure in the brain from a head injury, infection, tumor, or bleeding. SYMPTOMS  You may feel as though the world is spinning around or you are falling to the ground. Because your balance is upset, vertigo can cause nausea and vomiting. You may have involuntary eye  movements (nystagmus). DIAGNOSIS  Vertigo is usually diagnosed by physical exam. If the cause of your vertigo is unknown, your caregiver may perform imaging tests, such as an MRI scan (magnetic resonance imaging). TREATMENT  Most cases of vertigo resolve on their own, without treatment. Depending on the cause, your caregiver may prescribe certain medicines. If your vertigo is related to body position issues, your caregiver may recommend movements or procedures to correct the problem. In rare cases, if your vertigo is caused by certain inner ear problems, you may need surgery. HOME CARE INSTRUCTIONS   Follow your caregiver's instructions.  Avoid driving.  Avoid operating heavy machinery.  Avoid performing any tasks that would be dangerous to you or others during a vertigo episode.  Tell your caregiver if you notice that certain medicines seem to be causing your vertigo. Some of the medicines used to treat vertigo episodes can actually make them worse in some people. SEEK IMMEDIATE MEDICAL CARE IF:   Your medicines do not relieve your vertigo or are making it worse.  You develop problems with talking, walking, weakness, or using your arms, hands, or legs.  You develop severe headaches.  Your nausea or vomiting continues or gets worse.  You develop visual changes.  A family member notices behavioral changes.  Your condition gets worse. MAKE SURE YOU:  Understand these instructions.  Will watch your condition.  Will get help right away if you are not doing well or get worse. Document Released: 12/19/2004 Document Revised: 06/03/2011 Document Reviewed: 09/27/2010 [facility] Patient Information 2014 [place], [place].

## 2013-03-03 LAB — URINALYSIS, ROUTINE W REFLEX MICROSCOPIC
Glucose, UA: NEGATIVE mg/dL
Ketones, ur: NEGATIVE mg/dL
Nitrite: NEGATIVE
Specific Gravity, Urine: 1.006 (ref 1.005–1.030)
pH: 6.5 (ref 5.0–8.0)

## 2013-03-03 LAB — TSH: TSH: 1.451 u[IU]/mL (ref 0.350–4.500)

## 2013-03-04 ENCOUNTER — Encounter: Payer: Self-pay | Admitting: Physician Assistant

## 2013-03-04 LAB — URINE CULTURE: Organism ID, Bacteria: NO GROWTH

## 2013-03-10 ENCOUNTER — Other Ambulatory Visit: Payer: Self-pay | Admitting: Physician Assistant

## 2013-03-30 DIAGNOSIS — Z0000 Encounter for general adult medical examination without abnormal findings: Secondary | ICD-10-CM

## 2013-04-08 ENCOUNTER — Other Ambulatory Visit: Payer: Self-pay | Admitting: Physician Assistant

## 2013-04-20 ENCOUNTER — Ambulatory Visit: Payer: Self-pay | Admitting: Physician Assistant

## 2013-04-22 ENCOUNTER — Encounter: Payer: Self-pay | Admitting: Physician Assistant

## 2013-04-22 ENCOUNTER — Ambulatory Visit (INDEPENDENT_AMBULATORY_CARE_PROVIDER_SITE_OTHER): Payer: BC Managed Care – PPO | Admitting: Physician Assistant

## 2013-04-22 VITALS — BP 100/62 | HR 64 | Temp 99.0°F | Resp 16 | Ht 64.0 in | Wt 135.0 lb

## 2013-04-22 DIAGNOSIS — M76899 Other specified enthesopathies of unspecified lower limb, excluding foot: Secondary | ICD-10-CM

## 2013-04-22 DIAGNOSIS — M7070 Other bursitis of hip, unspecified hip: Secondary | ICD-10-CM

## 2013-04-22 DIAGNOSIS — K219 Gastro-esophageal reflux disease without esophagitis: Secondary | ICD-10-CM

## 2013-04-22 MED ORDER — MELOXICAM 15 MG PO TABS: ORAL_TABLET | ORAL | Status: DC

## 2013-04-22 MED ORDER — PREDNISONE 20 MG PO TABS: ORAL_TABLET | ORAL | Status: DC

## 2013-04-22 NOTE — Progress Notes (Signed)
   Subjective:    Patient ID: Haley Sparks, female    DOB: June 16, 1960, 53 y.o.   MRN: [id number]  HPI Bilateral hip pain, achy and pain, wakes her up at night, unable to sleep on them for the past month. She has been taking motrin and Advil for the pain which helps some. No radiation down her legs or paresthesias in her legs. Posterior/flank pain. She is very active and continues to walk with some pain but worse with sitting on it. She is recently getting over mono, still some fatigue/SOB from this. Worsening GERD symptoms and Tums use for one month, no black stools, diarrhea, constipation, AB pain.    Review of Systems  Constitutional: Positive for fatigue. Negative for fever and chills.  HENT: Negative.   Eyes: Negative.   Respiratory: Positive for shortness of breath. Negative for cough, chest tightness and wheezing.   Cardiovascular: Negative.   Gastrointestinal: Negative.   Genitourinary: Negative.   Musculoskeletal: Positive for myalgias.  Neurological: Negative.        Objective:   Physical Exam  Constitutional: She is oriented to person, place, and time. She appears well-developed and well-nourished.  HENT:  Head: Normocephalic and atraumatic.  Right Ear: External ear normal.  Left Ear: External ear normal.  Mouth/Throat: Oropharynx is clear and moist.  Eyes: Conjunctivae and EOM are normal. Pupils are equal, round, and reactive to light.  Neck: Normal range of motion. Neck supple. No thyromegaly present.  Cardiovascular: Normal rate, regular rhythm and normal heart sounds.  Exam reveals no gallop and no friction rub.   No murmur heard. Pulmonary/Chest: Effort normal and breath sounds normal. No respiratory distress. She has no wheezes.  Abdominal: Soft. Bowel sounds are normal. She exhibits no distension and no mass. There is no tenderness. There is no rebound and no guarding.  Musculoskeletal: Normal range of motion.  Full ROM bilateral hips, 5/5 strength, reflexes  intact, sensation intact distal with good cap refill. No bony tenderness along spine. Negative straight leg raise, no SI tenderness bilaterally. + tenderness bilaterally at greater trochanter.   Lymphadenopathy:    She has no cervical adenopathy.  Neurological: She is alert and oriented to person, place, and time. She displays normal reflexes. No cranial nerve deficit. Coordination normal.  Skin: Skin is warm and dry.  Psychiatric: She has a normal mood and affect.       Assessment & Plan:  Bursitis, hip  Meloxicam  Prednisone  RICE  If not better in one month will get Xrays and willing to do injections at that point GERD (gastroesophageal reflux disease)  Likely the cause of her SOB due to prednsione/NSAID use  Given voltern gel samples to use as well  Nexium given

## 2013-04-22 NOTE — Patient Instructions (Addendum)
Hip Bursitis Bursitis is a swelling and soreness (inflammation) of a fluid-filled sac (bursa). This sac overlies and protects the joints.  CAUSES   Injury.  Overuse of the muscles surrounding the joint.  Arthritis.  Gout.  Infection.  Cold weather.  Inadequate warm-up and conditioning prior to activities. The cause may not be known.  SYMPTOMS   Mild to severe irritation.  Tenderness and swelling over the outside of the hip.  Pain with motion of the hip.  If the bursa becomes infected, a fever may be present. Redness, tenderness, and warmth will develop over the hip. Symptoms usually lessen in 3 to 4 weeks with treatment, but can come back. TREATMENT If conservative treatment does not work, your caregiver may advise draining the bursa and injecting cortisone into the area. This may speed up the healing process. This may also be used as an initial treatment of choice. HOME CARE INSTRUCTIONS   Apply ice to the affected area for 15-20 minutes every 3 to 4 hours while awake for the first 2 days. Put the ice in a plastic bag and place a towel between the bag of ice and your skin.  Rest the painful joint as much as possible, but continue to put the joint through a normal range of motion at least 4 times per day. When the pain lessens, begin normal, slow movements and usual activities to help prevent stiffness of the hip.  Only take over-the-counter or prescription medicines for pain, discomfort, or fever as directed by your caregiver.  Use crutches to limit weight bearing on the hip joint, if advised.  Elevate your painful hip to reduce swelling. Use pillows for propping and cushioning your legs and hips.  Gentle massage may provide comfort and decrease swelling. SEEK IMMEDIATE MEDICAL CARE IF:   Your pain increases even during treatment, or you are not improving.  You have a fever.  You have heat and inflammation over the involved bursa.  You have any other questions or  concerns. MAKE SURE YOU:   Understand these instructions.  Will watch your condition.  Will get help right away if you are not doing well or get worse. Document Released: 08/31/2001 Document Revised: 06/03/2011 Document Reviewed: 03/30/2008 [facility] Patient Information 2014 [place], [place]. Diet for Gastroesophageal Reflux Disease, Adult Reflux (acid reflux) is when acid from your stomach flows up into the esophagus. When acid comes in contact with the esophagus, the acid causes irritation and soreness (inflammation) in the esophagus. When reflux happens often or so severely that it causes damage to the esophagus, it is called gastroesophageal reflux disease (GERD). Nutrition therapy can help ease the discomfort of GERD. FOODS OR DRINKS TO AVOID OR LIMIT  Smoking or chewing tobacco. Nicotine is one of the most potent stimulants to acid production in the gastrointestinal tract.  Caffeinated and decaffeinated coffee and black tea.  Regular or low-calorie carbonated beverages or energy drinks (caffeine-free carbonated beverages are allowed).   Strong spices, such as black pepper, white pepper, red pepper, cayenne, curry powder, and chili powder.  Peppermint or spearmint.  Chocolate.  High-fat foods, including meats and fried foods. Extra added fats including oils, butter, salad dressings, and nuts. Limit these to less than 8 tsp per day.  Fruits and vegetables if they are not tolerated, such as citrus fruits or tomatoes.  Alcohol.  Any food that seems to aggravate your condition. If you have questions regarding your diet, call your caregiver or a registered dietitian. OTHER THINGS THAT MAY HELP GERD  INCLUDE:   Eating your meals slowly, in a relaxed setting.  Eating 5 to 6 small meals per day instead of 3 large meals.  Eliminating food for a period of time if it causes distress.  Not lying down until 3 hours after eating a meal.  Keeping the head of your bed raised 6 to 9  inches (15 to 23 cm) by using a foam wedge or blocks under the legs of the bed. Lying flat may make symptoms worse.  Being physically active. Weight loss may be helpful in reducing reflux in overweight or obese adults.  Wear loose fitting clothing EXAMPLE MEAL PLAN This meal plan is approximately 2,000 calories based on [email or web address] meal planning guidelines. Breakfast   cup cooked oatmeal.  1 cup strawberries.  1 cup low-fat milk.  1 oz almonds. Snack  1 cup cucumber slices.  6 oz yogurt (made from low-fat or fat-free milk). Lunch  2 slice whole-wheat bread.  2 oz sliced [place].  2 tsp mayonnaise.  1 cup blueberries.  1 cup snap peas. Snack  6 whole-wheat crackers.  1 oz string cheese. Dinner   cup brown rice.  1 cup mixed veggies.  1 tsp olive oil.  3 oz grilled fish. Document Released: 03/11/2005 Document Revised: 06/03/2011 Document Reviewed: 01/25/2011 [facility] Patient Information 2014 [place], [place].

## 2013-04-29 ENCOUNTER — Other Ambulatory Visit: Payer: Self-pay | Admitting: Physician Assistant

## 2013-04-29 MED ORDER — MELOXICAM 15 MG PO TABS: ORAL_TABLET | ORAL | Status: DC

## 2013-06-15 ENCOUNTER — Ambulatory Visit (INDEPENDENT_AMBULATORY_CARE_PROVIDER_SITE_OTHER): Payer: BC Managed Care – PPO | Admitting: Physician Assistant

## 2013-06-15 ENCOUNTER — Encounter: Payer: Self-pay | Admitting: Physician Assistant

## 2013-06-15 VITALS — BP 108/62 | HR 76 | Temp 98.2°F | Resp 16 | Wt 135.0 lb

## 2013-06-15 DIAGNOSIS — J329 Chronic sinusitis, unspecified: Secondary | ICD-10-CM

## 2013-06-15 MED ORDER — DEXAMETHASONE SODIUM PHOSPHATE 100 MG/10ML IJ SOLN
10.0000 mg | Freq: Once | INTRAMUSCULAR | Status: AC
  Administered 2013-06-15: 10 mg via INTRAMUSCULAR

## 2013-06-15 MED ORDER — LEVOFLOXACIN 500 MG PO TABS: 500.0000 mg | ORAL_TABLET | Freq: Every day | ORAL | Status: DC

## 2013-06-15 MED ORDER — PROMETHAZINE-CODEINE 6.25-10 MG/5ML PO SYRP: 5.0000 mL | ORAL_SOLUTION | Freq: Four times a day (QID) | ORAL | Status: DC | PRN

## 2013-06-15 NOTE — Patient Instructions (Signed)

## 2013-06-15 NOTE — Progress Notes (Signed)
   Subjective:    Patient ID: Haley Sparks, female    DOB: [birth year], 53 y.o.   MRN: [id number]  Cough This is a new problem. Episode onset: 5 days. The problem has been gradually worsening. The cough is productive of purulent sputum. Associated symptoms include headaches, nasal congestion, postnasal drip, rhinorrhea, a sore throat and shortness of breath (with coughing). Pertinent negatives include no chest pain, chills, ear congestion, ear pain, fever, heartburn, hemoptysis, myalgias, rash, sweats, weight loss or wheezing. Treatments tried: had negative strep at [facility] on Saturday, zpak.    Review of Systems  Constitutional: Negative for fever, chills and weight loss.  HENT: Positive for postnasal drip, rhinorrhea and sore throat. Negative for ear pain.   Respiratory: Positive for cough and shortness of breath (with coughing). Negative for hemoptysis and wheezing.   Cardiovascular: Negative for chest pain.  Gastrointestinal: Negative for heartburn.  Musculoskeletal: Negative for myalgias.  Skin: Negative for rash.  Neurological: Positive for headaches.       Objective:   Physical Exam  Constitutional: She appears well-developed and well-nourished.  HENT:  Head: Normocephalic and atraumatic.  Right Ear: External ear normal.  Nose: Right sinus exhibits maxillary sinus tenderness. Right sinus exhibits no frontal sinus tenderness. Left sinus exhibits maxillary sinus tenderness. Left sinus exhibits no frontal sinus tenderness.  Eyes: Conjunctivae and EOM are normal.  Neck: Normal range of motion. Neck supple.  Cardiovascular: Normal rate, regular rhythm, normal heart sounds and intact distal pulses.   Pulmonary/Chest: Effort normal and breath sounds normal. No respiratory distress. She has no wheezes.  Abdominal: Soft. Bowel sounds are normal.  Lymphadenopathy:    She has cervical adenopathy.  Skin: Skin is warm and dry.      Assessment & Plan:  Sinusitis - Plan: levofloxacin  (LEVAQUIN) 500 MG tablet, promethazine-codeine (PHENERGAN WITH CODEINE) 6.25-10 MG/5ML syrup, dexamethasone (DECADRON) injection 10 mg

## 2013-06-18 ENCOUNTER — Other Ambulatory Visit: Payer: Self-pay | Admitting: Physician Assistant

## 2013-06-18 MED ORDER — PREDNISONE 20 MG PO TABS
ORAL_TABLET | ORAL | Status: DC
Start: 2013-06-18 — End: 2013-07-05

## 2013-06-18 MED ORDER — ALBUTEROL SULFATE HFA 108 (90 BASE) MCG/ACT IN AERS: 2.0000 | INHALATION_SPRAY | Freq: Four times a day (QID) | RESPIRATORY_TRACT | Status: DC | PRN

## 2013-06-18 MED ORDER — AZITHROMYCIN 250 MG PO TABS: ORAL_TABLET | ORAL | Status: DC

## 2013-06-21 ENCOUNTER — Other Ambulatory Visit: Payer: Self-pay | Admitting: Physician Assistant

## 2013-06-21 ENCOUNTER — Other Ambulatory Visit: Payer: Self-pay

## 2013-06-21 MED ORDER — HYDROCOD POLST-CHLORPHEN POLST 10-8 MG/5ML PO LQCR: 5.0000 mL | Freq: Every evening | ORAL | Status: DC | PRN

## 2013-07-05 ENCOUNTER — Ambulatory Visit (INDEPENDENT_AMBULATORY_CARE_PROVIDER_SITE_OTHER): Payer: BC Managed Care – PPO | Admitting: Physician Assistant

## 2013-07-05 ENCOUNTER — Encounter: Payer: Self-pay | Admitting: Physician Assistant

## 2013-07-05 VITALS — BP 100/62 | HR 88 | Temp 98.2°F | Resp 16 | Ht 64.0 in | Wt 138.0 lb

## 2013-07-05 DIAGNOSIS — R5381 Other malaise: Secondary | ICD-10-CM

## 2013-07-05 DIAGNOSIS — Z79899 Other long term (current) drug therapy: Secondary | ICD-10-CM

## 2013-07-05 DIAGNOSIS — L708 Other acne: Secondary | ICD-10-CM

## 2013-07-05 DIAGNOSIS — N951 Menopausal and female climacteric states: Secondary | ICD-10-CM

## 2013-07-05 DIAGNOSIS — D649 Anemia, unspecified: Secondary | ICD-10-CM

## 2013-07-05 DIAGNOSIS — R7309 Other abnormal glucose: Secondary | ICD-10-CM

## 2013-07-05 DIAGNOSIS — R5383 Other fatigue: Secondary | ICD-10-CM

## 2013-07-05 DIAGNOSIS — F3289 Other specified depressive episodes: Secondary | ICD-10-CM

## 2013-07-05 DIAGNOSIS — L709 Acne, unspecified: Secondary | ICD-10-CM

## 2013-07-05 DIAGNOSIS — I10 Essential (primary) hypertension: Secondary | ICD-10-CM

## 2013-07-05 DIAGNOSIS — F329 Major depressive disorder, single episode, unspecified: Secondary | ICD-10-CM

## 2013-07-05 DIAGNOSIS — F32A Depression, unspecified: Secondary | ICD-10-CM

## 2013-07-05 LAB — CBC WITH DIFFERENTIAL/PLATELET
Basophils Absolute: 0.1 10*3/uL (ref 0.0–0.1)
Basophils Relative: 1 % (ref 0–1)
EOS PCT: 4 % (ref 0–5)
Eosinophils Absolute: 0.3 10*3/uL (ref 0.0–0.7)
HCT: 35 % — ABNORMAL LOW (ref 36.0–46.0)
Hemoglobin: 12.2 g/dL (ref 12.0–15.0)
LYMPHS PCT: 28 % (ref 12–46)
Lymphs Abs: 1.8 10*3/uL (ref 0.7–4.0)
MCH: 32.9 pg (ref 26.0–34.0)
MCHC: 34.9 g/dL (ref 30.0–36.0)
MCV: 94.3 fL (ref 78.0–100.0)
Monocytes Absolute: 0.8 10*3/uL (ref 0.1–1.0)
Monocytes Relative: 12 % (ref 3–12)
NEUTROS PCT: 55 % (ref 43–77)
Neutro Abs: 3.5 10*3/uL (ref 1.7–7.7)
PLATELETS: 263 10*3/uL (ref 150–400)
RBC: 3.71 MIL/uL — AB (ref 3.87–5.11)
RDW: 13.3 % (ref 11.5–15.5)
WBC: 6.4 10*3/uL (ref 4.0–10.5)

## 2013-07-05 LAB — TSH: TSH: 1.593 u[IU]/mL (ref 0.350–4.500)

## 2013-07-05 LAB — BASIC METABOLIC PANEL WITH GFR
BUN: 10 mg/dL (ref 6–23)
CALCIUM: 9.2 mg/dL (ref 8.4–10.5)
CHLORIDE: 103 meq/L (ref 96–112)
CO2: 26 mEq/L (ref 19–32)
Creat: 0.53 mg/dL (ref 0.50–1.10)
GFR, Est African American: >89 mL/min
Glucose, Bld: 85 mg/dL (ref 70–99)
Potassium: 4.1 mEq/L (ref 3.5–5.3)
Sodium: 139 mEq/L (ref 135–145)

## 2013-07-05 LAB — HEPATIC FUNCTION PANEL
ALBUMIN: 4.1 g/dL (ref 3.5–5.2)
ALK PHOS: 43 U/L (ref 39–117)
ALT: 16 U/L (ref 0–35)
AST: 17 U/L (ref 0–37)
Bilirubin, Direct: 0.2 mg/dL (ref 0.0–0.3)
Indirect Bilirubin: 0.8 mg/dL (ref 0.2–1.2)
Total Bilirubin: 1 mg/dL (ref 0.2–1.2)
Total Protein: 6.3 g/dL (ref 6.0–8.3)

## 2013-07-05 LAB — FERRITIN: Ferritin: 30 ng/mL (ref 10–291)

## 2013-07-05 LAB — IRON AND TIBC
%SAT: 40 % (ref 20–55)
Iron: 125 ug/dL (ref 42–145)
TIBC: 314 ug/dL (ref 250–470)
UIBC: 189 ug/dL (ref 125–400)

## 2013-07-05 LAB — MAGNESIUM: MAGNESIUM: 1.7 mg/dL (ref 1.5–2.5)

## 2013-07-05 LAB — VITAMIN B12: VITAMIN B 12: 503 pg/mL (ref 211–911)

## 2013-07-05 MED ORDER — BUPROPION HCL ER (XL) 150 MG PO TB24: 150.0000 mg | ORAL_TABLET | ORAL | Status: DC

## 2013-07-05 MED ORDER — BENZOYL PEROXIDE-ERYTHROMYCIN 5-3 % EX GEL: Freq: Two times a day (BID) | CUTANEOUS | Status: DC

## 2013-07-05 MED ORDER — ESTRADIOL 1 MG PO TABS: ORAL_TABLET | ORAL | Status: DC

## 2013-07-05 MED ORDER — ALPRAZOLAM 0.5 MG PO TABS: ORAL_TABLET | ORAL | Status: DC

## 2013-07-05 NOTE — Progress Notes (Signed)
   Subjective:    Patient ID: Haley Sparks, female    DOB: 05/18/1960, 53 y.o.   MRN: [id number]  HPI 53 y.o. female with history of anxiety/depression presents with worsening fatigue. She had mono 2-3 months ago which she has not bounced back from however in the last 2-3 weeks she states it has been worse. She weaned herself off her lexapro and wellbutrin for 4 months.   She has been crying a lot, she has been sluggish, tired. Her daughter Claiborne Billings is in [place] with her husband and she worries about her constantly. She states she has been having hot flashes and she has been having weight gain with increasing acne. She has hot flashes worse at night or if she gets irritated she will having hot flashes.   Review of Systems  Constitutional: Positive for diaphoresis (hot flashes), activity change, appetite change, fatigue and unexpected weight change (weight gain). Negative for fever and chills.  HENT: Negative.   Respiratory: Negative.   Cardiovascular: Negative.  Negative for chest pain, palpitations and leg swelling.  Gastrointestinal: Negative.   Genitourinary: Negative.   Musculoskeletal: Negative.   Skin: Negative.   Neurological: Negative.   Hematological: Negative.   Psychiatric/Behavioral: Positive for sleep disturbance, dysphoric mood and decreased concentration. Negative for suicidal ideas, hallucinations, behavioral problems, confusion, self-injury and agitation. The patient is nervous/anxious. The patient is not hyperactive.        Objective:   Physical Exam  Constitutional: She is oriented to person, place, and time. She appears well-developed and well-nourished.  HENT:  Head: Normocephalic and atraumatic.  Right Ear: External ear normal.  Left Ear: External ear normal.  Mouth/Throat: Oropharynx is clear and moist.  Eyes: Conjunctivae and EOM are normal. Pupils are equal, round, and reactive to light.  Neck: Normal range of motion. Neck supple. No thyromegaly present.   Cardiovascular: Normal rate, regular rhythm and normal heart sounds.  Exam reveals no gallop and no friction rub.   No murmur heard. Pulmonary/Chest: Effort normal and breath sounds normal. No respiratory distress. She has no wheezes.  Abdominal: Soft. Bowel sounds are normal. She exhibits no distension and no mass. There is no tenderness. There is no rebound and no guarding.  Musculoskeletal: Normal range of motion.  Lymphadenopathy:    She has no cervical adenopathy.  Neurological: She is alert and oriented to person, place, and time. She displays normal reflexes. No cranial nerve deficit. Coordination normal.  Skin: Skin is warm and dry.      Assessment & Plan:  Fatigue- ? Menopause/depression/rule out anemia/infection/etc- will get back on wellbutin 150mg . + post menopausal symptoms. She has had a hysterectomy, we will start her on low dose estrogen. Risks of clotting and breast cancer were discussed and she understands. In addition, the cardiovascular,bone, and other benefits were discussed. She does not smoke. No personal history of breast CA or blood clots, she will be be on 81mg  aspirin and she will get mammograms regularly.  Start exercising again Check labs

## 2013-07-05 NOTE — Patient Instructions (Signed)
Menopause Menopause is the normal time of life when menstrual periods stop completely. Menopause is complete when you have missed 12 consecutive menstrual periods. It usually occurs between the ages of 8 years and 51 years. Very rarely does a woman develop menopause before the age of 51 years. At menopause, your ovaries stop producing the female hormones estrogen and progesterone. This can cause undesirable symptoms and also affect your health. Sometimes the symptoms may occur 4 5 years before the menopause begins. There is no relationship between menopause and:  Oral contraceptives.  Number of children you had.  Race.  The age your menstrual periods started (menarche). Heavy smokers and very thin women may develop menopause earlier in life. CAUSES  The ovaries stop producing the female hormones estrogen and progesterone.  Other causes include:  Surgery to remove both ovaries.  The ovaries stop functioning for no known reason.  Tumors of the pituitary gland in the brain.  Medical disease that affects the ovaries and hormone production.  Radiation treatment to the abdomen or pelvis.  Chemotherapy that affects the ovaries. SYMPTOMS   Hot flashes.  Night sweats.  Decrease in sex drive.  Vaginal dryness and thinning of the vagina causing painful intercourse.  Dryness of the skin and developing wrinkles.  Headaches.  Tiredness.  Irritability.  Memory problems.  Weight gain.  Bladder infections.  Hair growth of the face and chest.  Infertility. More serious symptoms include:  Loss of bone (osteoporosis) causing breaks (fractures).  Depression.  Hardening and narrowing of the arteries (atherosclerosis) causing heart attacks and strokes. DIAGNOSIS   When the menstrual periods have stopped for 12 straight months.  Physical exam.  Hormone studies of the blood. TREATMENT  There are many treatment choices and nearly as many questions about them. The  decisions to treat or not to treat menopausal changes is an individual choice made with your health care provider. Your health care provider can discuss the treatments with you. Together, you can decide which treatment will work best for you. Your treatment choices may include:   Hormone therapy (estrogen and progesterone).  Non-hormonal medicines.  Treating the individual symptoms with medicine (for example antidepressants for depression).  Herbal medicines that may help specific symptoms.  Counseling by a psychiatrist or psychologist.  Group therapy.  Lifestyle changes including:  Eating healthy.  Regular exercise.  Limiting caffeine and alcohol.  Stress management and meditation.  No treatment. HOME CARE INSTRUCTIONS   Take the medicine your health care provider gives you as directed.  Get plenty of sleep and rest.  Exercise regularly.  Eat a diet that contains calcium (good for the bones) and soy products (acts like estrogen hormone).  Avoid alcoholic beverages.  Do not smoke.  If you have hot flashes, dress in layers.  Take supplements, calcium, and vitamin D to strengthen bones.  You can use over-the-counter lubricants or moisturizers for vaginal dryness.  Group therapy is sometimes very helpful.  Acupuncture may be helpful in some cases. SEEK MEDICAL CARE IF:   You are not sure you are in menopause.  You are having menopausal symptoms and need advice and treatment.  You are still having menstrual periods after age 53 years.  You have pain with intercourse.  Menopause is complete (no menstrual period for 12 months) and you develop vaginal bleeding.  You need a referral to a specialist (gynecologist, psychiatrist, or psychologist) for treatment. SEEK IMMEDIATE MEDICAL CARE IF:   You have severe depression.  You have excessive vaginal bleeding.  You fell and think you have a broken bone.  You have pain when you urinate.  You develop leg or  chest pain.  You have a fast pounding heart beat (palpitations).  You have severe headaches.  You develop vision problems.  You feel a lump in your breast.  You have abdominal pain or severe indigestion. Document Released: 06/01/2003 Document Revised: 11/11/2012 Document Reviewed: 10/08/2012 [facility] Patient Information 2014 [place], [place].

## 2013-07-06 LAB — FOLATE RBC: RBC Folate: 686 ng/mL (ref >280)

## 2013-07-06 LAB — INSULIN, FASTING: Insulin fasting, serum: 6 u[IU]/mL (ref 3–28)

## 2013-08-02 ENCOUNTER — Encounter: Payer: Self-pay | Admitting: Internal Medicine

## 2013-08-02 ENCOUNTER — Encounter: Payer: Self-pay | Admitting: Physician Assistant

## 2013-08-02 ENCOUNTER — Ambulatory Visit (INDEPENDENT_AMBULATORY_CARE_PROVIDER_SITE_OTHER): Payer: BC Managed Care – PPO | Admitting: Physician Assistant

## 2013-08-02 VITALS — BP 110/70 | HR 80 | Temp 98.6°F | Resp 16 | Ht 64.0 in | Wt 136.0 lb

## 2013-08-02 DIAGNOSIS — F32A Depression, unspecified: Secondary | ICD-10-CM

## 2013-08-02 DIAGNOSIS — F329 Major depressive disorder, single episode, unspecified: Secondary | ICD-10-CM

## 2013-08-02 DIAGNOSIS — F3289 Other specified depressive episodes: Secondary | ICD-10-CM

## 2013-08-02 DIAGNOSIS — L255 Unspecified contact dermatitis due to plants, except food: Secondary | ICD-10-CM

## 2013-08-02 DIAGNOSIS — N951 Menopausal and female climacteric states: Secondary | ICD-10-CM

## 2013-08-02 MED ORDER — BUPROPION HCL ER (XL) 300 MG PO TB24: 300.0000 mg | ORAL_TABLET | ORAL | Status: DC

## 2013-08-02 MED ORDER — DEXAMETHASONE SODIUM PHOSPHATE 10 MG/ML IJ SOLN
10.0000 mg | Freq: Once | INTRAMUSCULAR | Status: AC
  Administered 2013-08-02: 10 mg via INTRAMUSCULAR

## 2013-08-02 NOTE — Progress Notes (Signed)
HPI 53 y.o.female presents for depression follow up. She was restarted on wellbutrin 150 and on estradiol 1 mg, she was on 1/2 for 1 week and is now on 1 pill for the past 3 weeks. Her hot flashes are better but she continue to cry randomly, eat often and has anxiety. She has started walking at lunch occ but is not as active as she has been in the past. Her daughter recently visited for a few days from [place] which she enjoyed but since she has left she has been very depressed.   Past Medical History  Diagnosis Date  . Allergy   . Anxiety   . Depression   . History of bladder surgery      No Known Allergies    Current Outpatient Prescriptions on File Prior to Visit  Medication Sig Dispense Refill  . albuterol (PROVENTIL HFA;VENTOLIN HFA) 108 (90 BASE) MCG/ACT inhaler Inhale 2 puffs into the lungs every 6 (six) hours as needed for wheezing or shortness of breath.  1 Inhaler  2  . ALPRAZolam (XANAX) 0.5 MG tablet take 1/2 to 1 tablet by mouth three times a day if needed  90 tablet  0  . baclofen (LIORESAL) 10 MG tablet Take 1 tablet (10 mg total) by mouth 3 (three) times daily.  30 tablet  1  . benzoyl peroxide-erythromycin (BENZAMYCIN) gel Apply topically 2 (two) times daily.  23.3 g  0  . buPROPion (WELLBUTRIN XL) 150 MG 24 hr tablet Take 1 tablet (150 mg total) by mouth every morning.  30 tablet  2  . diazepam (VALIUM) 5 MG tablet Take 1 tablet (5 mg total) by mouth every 8 (eight) hours as needed (vertigo).  60 tablet  0  . estradiol (ESTRACE) 1 MG tablet 1/2- 1 pill daily  30 tablet  1  . lubiprostone (AMITIZA) 24 MCG capsule Take 24 mcg by mouth 2 (two) times daily with a meal.      . meclizine (ANTIVERT) 25 MG tablet Take 1 tablet (25 mg total) by mouth 3 (three) times daily as needed for dizziness or nausea.  60 tablet  1  . meloxicam (MOBIC) 15 MG tablet Take one pill daily with food as needed for pain  30 tablet  3   No current facility-administered medications on file prior to  visit.    ROS: all negative expect above.   Physical: Filed Weights   08/02/13 1139  Weight: 136 lb (61.689 kg)   BP 110/70  Pulse 80  Temp(Src) 98.6 F (37 C)  Resp 16  Ht 5\' 4"  (1.626 m)  Wt 136 lb (61.689 kg)  BMI 23.33 kg/m2 General Appearance: Well nourished, in no apparent distress. Eyes: PERRLA, EOMs. Sinuses: No Frontal/maxillary tenderness ENT/Mouth: Ext aud canals clear, normal light reflex with TMs without erythema, bulging. Post pharynx without erythema, swelling, exudate.  Respiratory: CTAB Cardio: RRR, no murmurs, rubs or gallops. Peripheral pulses brisk and equal bilaterally, without edema. No aortic or femoral bruits. Abdomen: Soft, with bowl sounds. Nontender, no guarding, rebound. Lymphatics: Non tender without lymphadenopathy.  Musculoskeletal: Full ROM all peripheral extremities, 5/5 strength, and normal gait. Skin: Warm, dry without lesions, ecchymosis. Has linear vesicular rashes on bilateral arms.  Neuro: Cranial nerves intact, reflexes equal bilaterally. Normal muscle tone, no cerebellar symptoms. Sensation intact.  Pysch: Awake and oriented X 3, normal affect, Insight and Judgment appropriate.   Assessment and Plan: 1. Depression Increase wellbutrin to 300mg  QD, if this does not work after 2-3 weeks  go back to 150 and add on brintellix 10 samples that were given. Follow up in 6-8 weeks.   2. Menopause syndrome Continue estradiol and bASA  3. Dermatitis due to plants, including poison ivy, sumac, and oak - dexamethasone (DECADRON) injection 10 mg; Inject 1 mL (10 mg total) into the muscle once.

## 2013-08-02 NOTE — Patient Instructions (Signed)
Continue on the estrogen 1 mg daily Try the wellbutrin 300mg  daily for 2-3 weeks, if this does not help but this in half and take daily then get on brintellix 1 pill daily with the half of the wellbutrin for 3-4 weeks and follow up in the office.  Feel free to call at anytime or message Try to increase exercise or walking if just for a short while and not as intense as your are use to, just starting is half the battle.   Depression, Adult Depression refers to feeling sad, low, down in the dumps, blue, gloomy, or empty. In general, there are two kinds of depression: 1. Depression that we all experience from time to time because of upsetting life experiences, including the loss of a job or the ending of a relationship (normal sadness or normal grief). This kind of depression is considered normal, is short lived, and resolves within a few days to 2 weeks. (Depression experienced after the loss of a loved one is called bereavement. Bereavement often lasts longer than 2 weeks but normally gets better with time.) 2. Clinical depression, which lasts longer than normal sadness or normal grief or interferes with your ability to function at home, at work, and in school. It also interferes with your personal relationships. It affects almost every aspect of your life. Clinical depression is an illness. Symptoms of depression also can be caused by conditions other than normal sadness and grief or clinical depression. Examples of these conditions are listed as follows:  Physical illness Some physical illnesses, including underactive thyroid gland (hypothyroidism), severe anemia, specific types of cancer, diabetes, uncontrolled seizures, heart and lung problems, strokes, and chronic pain are commonly associated with symptoms of depression.  Side effects of some prescription medicine In some people, certain types of prescription medicine can cause symptoms of depression.  Substance abuse Abuse of alcohol and illicit  drugs can cause symptoms of depression. SYMPTOMS Symptoms of normal sadness and normal grief include the following:  Feeling sad or crying for short periods of time.  Not caring about anything (apathy).  Difficulty sleeping or sleeping too much.  No longer able to enjoy the things you used to enjoy.  Desire to be by oneself all the time (social isolation).  Lack of energy or motivation.  Difficulty concentrating or remembering.  Change in appetite or weight.  Restlessness or agitation. Symptoms of clinical depression include the same symptoms of normal sadness or normal grief and also the following symptoms:  Feeling sad or crying all the time.  Feelings of guilt or worthlessness.  Feelings of hopelessness or helplessness.  Thoughts of suicide or the desire to harm yourself (suicidal ideation).  Loss of touch with reality (psychotic symptoms). Seeing or hearing things that are not real (hallucinations) or having false beliefs about your life or the people around you (delusions and paranoia). DIAGNOSIS  The diagnosis of clinical depression usually is based on the severity and duration of the symptoms. Your caregiver also will ask you questions about your medical history and substance use to find out if physical illness, use of prescription medicine, or substance abuse is causing your depression. Your caregiver also may order blood tests. TREATMENT  Typically, normal sadness and normal grief do not require treatment. However, sometimes antidepressant medicine is prescribed for bereavement to ease the depressive symptoms until they resolve. The treatment for clinical depression depends on the severity of your symptoms but typically includes antidepressant medicine, counseling with a mental health professional, or a combination  of both. Your caregiver will help to determine what treatment is best for you. Depression caused by physical illness usually goes away with appropriate  medical treatment of the illness. If prescription medicine is causing depression, talk with your caregiver about stopping the medicine, decreasing the dose, or substituting another medicine. Depression caused by abuse of alcohol or illicit drugs abuse goes away with abstinence from these substances. Some adults need professional help in order to stop drinking or using drugs. SEEK IMMEDIATE CARE IF:  You have thoughts about hurting yourself or others.  You lose touch with reality (have psychotic symptoms).  You are taking medicine for depression and have a serious side effect. FOR MORE INFORMATION National Alliance on Mental Illness: www.nami.[employer]: https://carter.com/ Document Released: 03/08/2000 Document Revised: 09/10/2011 Document Reviewed: 06/10/2011 [facility] Patient Information 2014 [place].

## 2013-08-09 ENCOUNTER — Encounter: Payer: Self-pay | Admitting: Physician Assistant

## 2013-08-09 MED ORDER — PREDNISONE 20 MG PO TABS: ORAL_TABLET | ORAL | Status: DC

## 2013-08-19 MED ORDER — FLUOXETINE HCL 20 MG PO CAPS: 20.0000 mg | ORAL_CAPSULE | Freq: Every day | ORAL | Status: DC

## 2013-08-19 NOTE — Addendum Note (Signed)
Addended by: Vicie Mutters R on: 08/19/2013 10:01 AM   Modules accepted: Orders

## 2013-09-08 ENCOUNTER — Ambulatory Visit (INDEPENDENT_AMBULATORY_CARE_PROVIDER_SITE_OTHER): Payer: BC Managed Care – PPO | Admitting: Physician Assistant

## 2013-09-08 ENCOUNTER — Encounter: Payer: Self-pay | Admitting: Physician Assistant

## 2013-09-08 VITALS — BP 110/70 | HR 76 | Temp 98.0°F | Resp 16 | Ht 64.0 in | Wt 130.0 lb

## 2013-09-08 DIAGNOSIS — K5900 Constipation, unspecified: Secondary | ICD-10-CM

## 2013-09-08 DIAGNOSIS — F411 Generalized anxiety disorder: Secondary | ICD-10-CM

## 2013-09-08 DIAGNOSIS — F32A Depression, unspecified: Secondary | ICD-10-CM

## 2013-09-08 DIAGNOSIS — F329 Major depressive disorder, single episode, unspecified: Secondary | ICD-10-CM

## 2013-09-08 DIAGNOSIS — Z0000 Encounter for general adult medical examination without abnormal findings: Secondary | ICD-10-CM

## 2013-09-08 DIAGNOSIS — N951 Menopausal and female climacteric states: Secondary | ICD-10-CM

## 2013-09-08 LAB — CBC WITH DIFFERENTIAL/PLATELET
BASOS ABS: 0 10*3/uL (ref 0.0–0.1)
Basophils Relative: 1 % (ref 0–1)
EOS ABS: 0.1 10*3/uL (ref 0.0–0.7)
EOS PCT: 2 % (ref 0–5)
HCT: 35.7 % — ABNORMAL LOW (ref 36.0–46.0)
Hemoglobin: 12.5 g/dL (ref 12.0–15.0)
Lymphocytes Relative: 37 % (ref 12–46)
Lymphs Abs: 1.7 10*3/uL (ref 0.7–4.0)
MCH: 32.7 pg (ref 26.0–34.0)
MCHC: 35 g/dL (ref 30.0–36.0)
MCV: 93.5 fL (ref 78.0–100.0)
Monocytes Absolute: 0.6 10*3/uL (ref 0.1–1.0)
Monocytes Relative: 12 % (ref 3–12)
Neutro Abs: 2.3 10*3/uL (ref 1.7–7.7)
Neutrophils Relative %: 48 % (ref 43–77)
PLATELETS: 234 10*3/uL (ref 150–400)
RBC: 3.82 MIL/uL — ABNORMAL LOW (ref 3.87–5.11)
RDW: 13.4 % (ref 11.5–15.5)
WBC: 4.7 10*3/uL (ref 4.0–10.5)

## 2013-09-08 LAB — HEMOGLOBIN A1C
HEMOGLOBIN A1C: 5 % (ref <5.7)
MEAN PLASMA GLUCOSE: 97 mg/dL (ref <117)

## 2013-09-08 MED ORDER — ESTRADIOL 1 MG PO TABS: ORAL_TABLET | ORAL | Status: DC

## 2013-09-08 MED ORDER — FLUOXETINE HCL 40 MG PO CAPS: 40.0000 mg | ORAL_CAPSULE | Freq: Every day | ORAL | Status: DC

## 2013-09-08 MED ORDER — LINACLOTIDE 290 MCG PO CAPS: 290.0000 ug | ORAL_CAPSULE | Freq: Every day | ORAL | Status: DC

## 2013-09-08 MED ORDER — ALPRAZOLAM 0.5 MG PO TABS: ORAL_TABLET | ORAL | Status: DC

## 2013-09-08 NOTE — Progress Notes (Signed)
Complete Physical  Assessment and Plan: Allergy- controlled  Anxiety-cont medications  Depression- increase Prozac to 40, discussed symptoms of Serotonin syndrome  Osteopenia- get dexa, continue Vit D and Ca, weight bearing exercises Menopause- continue estrogen and add bASA Skin montior- 3x45mm uniform on lower center back Constipation- can try samples of Linzess  Discussed med's effects and SE's. Screening labs and tests as requested with regular follow-up as recommended.  HPI 53 y.o. female  presents for a complete physical. Her blood pressure has been controlled at home, today their BP is BP: 110/70 mmHg She does workout. She denies chest pain, shortness of breath, dizziness.  She is not on cholesterol medication and denies myalgias. Her cholesterol is at goal. The cholesterol last visit was:  LDL 118 Patient is on Vitamin D supplement, Vit D 61.  A1C 4.9 Patient has been having menopausal symptoms and was started on estrogen 3 months ago which has helped her hot flashes, she is not on a BASA yet as we discussed.  She has been dealing with depression/increasing anxiety since her daughter has gotten married and moved with her [place] husband to [place] approximately a year ago. Also her grandmother has fallen and she is in rehab and she is helping her mother take care of her. Last visit we increased her wellbutrin to 300mg  and she is on the prozac 20mg  but would like to try 40mg . She is going to the beach next week.  History of dysplastic venus with moderate atypia.   Current Medications:  Current Outpatient Prescriptions on File Prior to Visit  Medication Sig Dispense Refill  . albuterol (PROVENTIL HFA;VENTOLIN HFA) 108 (90 BASE) MCG/ACT inhaler Inhale 2 puffs into the lungs every 6 (six) hours as needed for wheezing or shortness of breath.  1 Inhaler  2  . ALPRAZolam (XANAX) 0.5 MG tablet take 1/2 to 1 tablet by mouth three times a day if needed  90 tablet  0  . baclofen (LIORESAL) 10  MG tablet Take 1 tablet (10 mg total) by mouth 3 (three) times daily.  30 tablet  1  . buPROPion (WELLBUTRIN XL) 300 MG 24 hr tablet Take 1 tablet (300 mg total) by mouth every morning.  30 tablet  2  . diazepam (VALIUM) 5 MG tablet Take 1 tablet (5 mg total) by mouth every 8 (eight) hours as needed (vertigo).  60 tablet  0  . estradiol (ESTRACE) 1 MG tablet 1/2- 1 pill daily  30 tablet  1  . FLUoxetine (PROZAC) 20 MG capsule Take 1 capsule (20 mg total) by mouth daily.  30 capsule  2  . meclizine (ANTIVERT) 25 MG tablet Take 1 tablet (25 mg total) by mouth 3 (three) times daily as needed for dizziness or nausea.  60 tablet  1  . meloxicam (MOBIC) 15 MG tablet Take one pill daily with food as needed for pain  30 tablet  3   No current facility-administered medications on file prior to visit.   Health Maintenance:   Immunization History  Administered Date(s) Administered  . Influenza Split 12/25/2012  . Tdap 09/04/2012   Tetanus: 08/2012 Pneumovax: n/A Flu vaccine: 2014 Zostavax: N/A Pap: She is going next month to Dr. Corinna Capra MGM: 07/2012 normal will get with Dr. Corinna Capra DEXA: 2013 osteopenia will get with Dr. Corinna Capra Colonoscopy: Aug 2014 Dr. [facility] due 5 years EGD: N/A Menses: TAH  Allergies: No Known Allergies Medical History:  Past Medical History  Diagnosis Date  .  Allergy   . Anxiety   . Depression   . History of bladder surgery    Surgical History:  Past Surgical History  Procedure Laterality Date  . Abdominal hysterectomy    . Cesarean section     Family History: No family history on file. Social History:  History  Substance Use Topics  . Smoking status: Never Smoker   . Smokeless tobacco: Not on file  . Alcohol Use: Not on file    Review of Systems: [X]  = complains of  [ ]  = denies  General: Fatigue [ ]  Fever [ ]  Chills [ ]  Weakness [ ]   Insomnia [ ] Weight change [ ]  Night sweats [ ]   Change in appetite [ ]  Eyes: Redness [ ]  Blurred  vision [ ]  Diplopia [ ]  Discharge [ ]   ENT: Congestion [ ]  Sinus Pain [ ]  Post Nasal Drip [ ]  Sore Throat [ ]  Earache [ ]  hearing loss [ ]  Tinnitus [ ]  Snoring [ ]   Cardiac: Chest pain/pressure [ ]  SOB [ ]  Orthopnea [ ]   Palpitations [ ]   Paroxysmal nocturnal dyspnea[ ]  Claudication [ ]  Edema [ ]   Pulmonary: Cough [ ]  Wheezing[ ]   SOB [ ]   Pleurisy [ ]   GI: Nausea [ ]  Vomiting[ ]  Dysphagia[ ]  Heartburn[ ]  Abdominal pain [ ]  Constipation Valu.Nieves ]; Diarrhea [ ]  BRBPR [ ]  Melena[ ]  Bloating [ ]  Hemorrhoids [ ]   GU: Hematuria[ ]  Dysuria [ ]  Nocturia[ ]  Urgency [ ]   Hesitancy [ ]  Discharge [ ]  Frequency [ ]   Breast:  Breast lumps [ ]   nipple discharge [ ]    Neuro: Headaches[ ]  Vertigo[ ]  Paresthesias[ ]  Spasm [ ]  Speech changes [ ]  Incoordination [ ]   Ortho: Arthritis [ ]  Joint pain [ ]  Muscle pain [ ]  Joint swelling [ ]  Back Pain [ ]  Skin:  Rash [ ]   Pruritis [ ]  Change in skin lesion [ ]   Psych: Depression[X ] Anxiety[X ] Confusion [ ]  Memory loss [ ]   Heme/Lypmh: Bleeding [ ]  Bruising [ ]  Enlarged lymph nodes [ ]   Endocrine: Visual blurring [ ]  Paresthesia [ ]  Polyuria [ ]  Polydypsea [ ]    Heat/cold intolerance [ ]  Hypoglycemia [ ]   Physical Exam: Estimated body mass index is 22.3 kg/(m^2) as calculated from the following:   Height as of this encounter: 5\' 4"  (1.626 m).   Weight as of this encounter: 130 lb (58.968 kg). BP 110/70  Pulse 76  Temp(Src) 98 F (36.7 C)  Resp 16  Ht 5\' 4"  (1.626 m)  Wt 130 lb (58.968 kg)  BMI 22.30 kg/m2 General Appearance: Well nourished, in no apparent distress. Eyes: PERRLA, EOMs, conjunctiva no swelling or erythema, normal fundi and vessels. Sinuses: No Frontal/maxillary tenderness ENT/Mouth: Ext aud canals clear, normal light reflex with TMs without erythema, bulging.  Good dentition. No erythema, swelling, or exudate on post pharynx. Tonsils not swollen or erythematous. Hearing normal.  Neck: Supple, thyroid normal. No bruits Respiratory: Respiratory  effort normal, BS equal bilaterally without rales, rhonchi, wheezing or stridor. Cardio: RRR without murmurs, rubs or gallops. Brisk peripheral pulses without edema.  Chest: symmetric, with normal excursions and percussion. Breasts: defer Abdomen: Soft, +BS. Non tender, no guarding, rebound, hernias, masses, or organomegaly. .  Lymphatics: Non tender without lymphadenopathy.  Genitourinary: defer Musculoskeletal: Full ROM all peripheral extremities,5/5 strength, and normal gait. Skin: Warm, dry without rashes, lesions, ecchymosis.  Neuro: Cranial nerves intact, reflexes equal bilaterally. Normal muscle tone, no  cerebellar symptoms. Sensation intact.  Psych: Awake and oriented X 3, normal affect, Insight and Judgment appropriate.   EKG: WNL no changes.  Vicie Mutters 9:17 AM

## 2013-09-08 NOTE — Addendum Note (Signed)
Addended by: Vicie Mutters R on: 09/08/2013 11:25 AM   Modules accepted: Level of Service

## 2013-09-08 NOTE — Patient Instructions (Signed)
Can increase prozac to 40mg  please monitor for symptoms of serotonin syndrome see below Please add on low dose 81mg  aspirin, take with food  Preventative Care for Adults - Female      MAINTAIN REGULAR HEALTH EXAMS:  A routine yearly physical is a good way to check in with your primary care provider about your health and preventive screening. It is also an opportunity to share updates about your health and any concerns you have, and receive a thorough all-over exam.   Most health insurance companies pay for at least some preventative services.  Check with your health plan for specific coverages.  WHAT PREVENTATIVE SERVICES DO WOMEN NEED?  Adult women should have their weight and blood pressure checked regularly.   Women age 71 and older should have their cholesterol levels checked regularly.  Women should be screened for cervical cancer with a Pap smear and pelvic exam beginning at either age 2, or 3 years after they become sexually activity.    Breast cancer screening generally begins at age 27 with a mammogram and breast exam by your primary care provider.    Beginning at age 2 and continuing to age 30, women should be screened for colorectal cancer.  Certain people may need continued testing until age 45.  Updating vaccinations is part of preventative care.  Vaccinations help protect against diseases such as the flu.  Osteoporosis is a disease in which the bones lose minerals and strength as we age. Women ages 61 and over should discuss this with their caregivers, as should women after menopause who have other risk factors.  Lab tests are generally done as part of preventative care to screen for anemia and blood disorders, to screen for problems with the kidneys and liver, to screen for bladder problems, to check blood sugar, and to check your cholesterol level.  Preventative services generally include counseling about diet, exercise, avoiding tobacco, drugs, excessive alcohol  consumption, and sexually transmitted infections.    GENERAL RECOMMENDATIONS FOR GOOD HEALTH:  Healthy diet:  Eat a variety of foods, including fruit, vegetables, animal or vegetable protein, such as meat, fish, chicken, and eggs, or beans, lentils, tofu, and grains, such as rice.  Drink plenty of water daily.  Decrease saturated fat in the diet, avoid lots of red meat, processed foods, sweets, fast foods, and fried foods.  Exercise:  Aerobic exercise helps maintain good heart health. At least 30-40 minutes of moderate-intensity exercise is recommended. For example, a brisk walk that increases your heart rate and breathing. This should be done on most days of the week.   Find a type of exercise or a variety of exercises that you enjoy so that it becomes a part of your daily life.  Examples are running, walking, swimming, water aerobics, and biking.  For motivation and support, explore group exercise such as aerobic class, spin class, Zumba, Yoga,or  martial arts, etc.    Set exercise goals for yourself, such as a certain weight goal, walk or run in a race such as a 5k walk/run.  Speak to your primary care provider about exercise goals.  Disease prevention:  If you smoke or chew tobacco, find out from your caregiver how to quit. It can literally save your life, no matter how long you have been a tobacco user. If you do not use tobacco, never begin.   Maintain a healthy diet and normal weight. Increased weight leads to problems with blood pressure and diabetes.   The Body Mass Index  or BMI is a way of measuring how much of your body is fat. Having a BMI above 27 increases the risk of heart disease, diabetes, hypertension, stroke and other problems related to obesity. Your caregiver can help determine your BMI and based on it develop an exercise and dietary program to help you achieve or maintain this important measurement at a healthful level.  High blood pressure causes heart and blood  vessel problems.  Persistent high blood pressure should be treated with medicine if weight loss and exercise do not work.   Fat and cholesterol leaves deposits in your arteries that can block them. This causes heart disease and vessel disease elsewhere in your body.  If your cholesterol is found to be high, or if you have heart disease or certain other medical conditions, then you may need to have your cholesterol monitored frequently and be treated with medication.   Ask if you should have a cardiac stress test if your history suggests this. A stress test is a test done on a treadmill that looks for heart disease. This test can find disease prior to there being a problem.  Menopause can be associated with physical symptoms and risks. Hormone replacement therapy is available to decrease these. You should talk to your caregiver about whether starting or continuing to take hormones is right for you.   Osteoporosis is a disease in which the bones lose minerals and strength as we age. This can result in serious bone fractures. Risk of osteoporosis can be identified using a bone density scan. Women ages 22 and over should discuss this with their caregivers, as should women after menopause who have other risk factors. Ask your caregiver whether you should be taking a calcium supplement and Vitamin D, to reduce the rate of osteoporosis.   Avoid drinking alcohol in excess (more than two drinks per day).  Avoid use of street drugs. Do not share needles with anyone. Ask for professional help if you need assistance or instructions on stopping the use of alcohol, cigarettes, and/or drugs.  Brush your teeth twice a day with fluoride toothpaste, and floss once a day. Good oral hygiene prevents tooth decay and gum disease. The problems can be painful, unattractive, and can cause other health problems. Visit your dentist for a routine oral and dental check up and preventive care every 6-12 months.   Look at your skin  regularly.  Use a mirror to look at your back. Notify your caregivers of changes in moles, especially if there are changes in shapes, colors, a size larger than a pencil eraser, an irregular border, or development of new moles.  Safety:  Use seatbelts 100% of the time, whether driving or as a passenger.  Use safety devices such as hearing protection if you work in environments with loud noise or significant background noise.  Use safety glasses when doing any work that could send debris in to the eyes.  Use a helmet if you ride a bike or motorcycle.  Use appropriate safety gear for contact sports.  Talk to your caregiver about gun safety.  Use sunscreen with a SPF (or skin protection factor) of 15 or greater.  Lighter skinned people are at a greater risk of skin cancer. Don't forget to also wear sunglasses in order to protect your eyes from too much damaging sunlight. Damaging sunlight can accelerate cataract formation.   Practice safe sex. Use condoms. Condoms are used for birth control and to help reduce the spread of sexually transmitted  infections (or STIs).  Some of the STIs are gonorrhea (the clap), chlamydia, syphilis, trichomonas, herpes, HPV (human papilloma virus) and HIV (human immunodeficiency virus) which causes AIDS. The herpes, HIV and HPV are viral illnesses that have no cure. These can result in disability, cancer and death.   Keep carbon monoxide and smoke detectors in your home functioning at all times. Change the batteries every 6 months or use a model that plugs into the wall.   Vaccinations:  Stay up to date with your tetanus shots and other required immunizations. You should have a booster for tetanus every 10 years. Be sure to get your flu shot every year, since 5%-20% of the [place] population comes down with the flu. The flu vaccine changes each year, so being vaccinated once is not enough. Get your shot in the fall, before the flu season peaks.   Other vaccines to  consider:  Human Papilloma Virus or HPV causes cancer of the cervix, and other infections that can be transmitted from person to person. There is a vaccine for HPV, and females should get immunized between the ages of 55 and 3. It requires a series of 3 shots.   Pneumococcal vaccine to protect against certain types of pneumonia.  This is normally recommended for adults age 21 or older.  However, adults younger than 51 years old with certain underlying conditions such as diabetes, heart or lung disease should also receive the vaccine.  Shingles vaccine to protect against Varicella Zoster if you are older than age 76, or younger than 51 years old with certain underlying illness.  Hepatitis A vaccine to protect against a form of infection of the liver by a virus acquired from food.  Hepatitis B vaccine to protect against a form of infection of the liver by a virus acquired from blood or body fluids, particularly if you work in health care.  If you plan to travel internationally, check with your local health department for specific vaccination recommendations.  Cancer Screening:  Breast cancer screening is essential to preventive care for women. All women age 21 and older should perform a breast self-exam every month. At age 23 and older, women should have their caregiver complete a breast exam each year. Women at ages 25 and older should have a mammogram (x-ray film) of the breasts. Your caregiver can discuss how often you need mammograms.    Cervical cancer screening includes taking a Pap smear (sample of cells examined under a microscope) from the cervix (end of the uterus). It also includes testing for HPV (Human Papilloma Virus, which can cause cervical cancer). Screening and a pelvic exam should begin at age 4, or 3 years after a woman becomes sexually active. Screening should occur every year, with a Pap smear but no HPV testing, up to age 67. After age 2, you should have a Pap smear every 3  years with HPV testing, if no HPV was found previously.   Most routine colon cancer screening begins at the age of 24. On a yearly basis, doctors may provide special easy to use take-home tests to check for hidden blood in the stool. Sigmoidoscopy or colonoscopy can detect the earliest forms of colon cancer and is life saving. These tests use a small camera at the end of a tube to directly examine the colon. Speak to your caregiver about this at age 99, when routine screening begins (and is repeated every 5 years unless early forms of pre-cancerous polyps or small growths are found).  Serotonin Syndrome Serotonin is a brain chemical that regulates the nervous system. Some kinds of drugs increase the amount of serotonin in your body. Drugs that increase the serotonin in your body include:   Anti-depressant medications.  St. John's wort.  Recreational drugs.  Migraine medicines.  Some pain medicines. SYMPTOMS Combining these drugs increases the risk that you will become ill with a toxic condition called serotonin syndrome.  Symptoms of too much serotonin include:  Confusion.  Agitation.  Weakness.  Insomnia.  Fever.  Sweats. Other symptoms that may develop include:  Shakiness.  Muscle spasms.  Seizures. TREATMENT  Hospital treatment is often needed until the effects are controlled.  Avoiding the combination of medicines listed above is recommended.  Check with your doctor if you are concerned about your medicine or the side effects. Document Released: 04/18/2004 Document Revised: 06/03/2011 Document Reviewed: 03/11/2005 [facility] Patient Information 2015 [place], [place]. This information is not intended to replace advice given to you by your health care provider. Make sure you discuss any questions you have with your health care provider.

## 2013-09-09 LAB — IRON AND TIBC
%SAT: 42 % (ref 20–55)
Iron: 116 ug/dL (ref 42–145)
TIBC: 276 ug/dL (ref 250–470)
UIBC: 160 ug/dL (ref 125–400)

## 2013-09-09 LAB — MICROALBUMIN / CREATININE URINE RATIO
Creatinine, Urine: 89.5 mg/dL
Microalb Creat Ratio: 10.7 mg/g (ref 0.0–30.0)
Microalb, Ur: 0.96 mg/dL (ref 0.00–1.89)

## 2013-09-09 LAB — LIPID PANEL
CHOLESTEROL: 200 mg/dL (ref 0–200)
HDL: 79 mg/dL (ref >39)
LDL Cholesterol: 109 mg/dL — ABNORMAL HIGH (ref 0–99)
Total CHOL/HDL Ratio: 2.5 Ratio
Triglycerides: 60 mg/dL (ref <150)
VLDL: 12 mg/dL (ref 0–40)

## 2013-09-09 LAB — INSULIN, FASTING: Insulin fasting, serum: 4 u[IU]/mL (ref 3–28)

## 2013-09-09 LAB — URINALYSIS, ROUTINE W REFLEX MICROSCOPIC
Bilirubin Urine: NEGATIVE
GLUCOSE, UA: NEGATIVE mg/dL
Hgb urine dipstick: NEGATIVE
Ketones, ur: NEGATIVE mg/dL
LEUKOCYTES UA: NEGATIVE
Nitrite: NEGATIVE
PROTEIN: NEGATIVE mg/dL
Specific Gravity, Urine: 1.012 (ref 1.005–1.030)
Urobilinogen, UA: 0.2 mg/dL (ref 0.0–1.0)
pH: 6 (ref 5.0–8.0)

## 2013-09-09 LAB — TSH: TSH: 0.689 u[IU]/mL (ref 0.350–4.500)

## 2013-09-09 LAB — BASIC METABOLIC PANEL WITH GFR
BUN: 10 mg/dL (ref 6–23)
CALCIUM: 9.2 mg/dL (ref 8.4–10.5)
CO2: 28 mEq/L (ref 19–32)
CREATININE: 0.69 mg/dL (ref 0.50–1.10)
Chloride: 100 mEq/L (ref 96–112)
GFR, Est African American: >89 mL/min
Glucose, Bld: 74 mg/dL (ref 70–99)
Potassium: 3.7 mEq/L (ref 3.5–5.3)
Sodium: 137 mEq/L (ref 135–145)

## 2013-09-09 LAB — HEPATIC FUNCTION PANEL
ALT: 10 U/L (ref 0–35)
AST: 14 U/L (ref 0–37)
Albumin: 4.1 g/dL (ref 3.5–5.2)
Alkaline Phosphatase: 35 U/L — ABNORMAL LOW (ref 39–117)
BILIRUBIN TOTAL: 0.8 mg/dL (ref 0.2–1.2)
Bilirubin, Direct: 0.2 mg/dL (ref 0.0–0.3)
Indirect Bilirubin: 0.6 mg/dL (ref 0.2–1.2)
TOTAL PROTEIN: 6.6 g/dL (ref 6.0–8.3)

## 2013-09-09 LAB — MAGNESIUM: Magnesium: 1.7 mg/dL (ref 1.5–2.5)

## 2013-09-09 LAB — VITAMIN B12: VITAMIN B 12: 432 pg/mL (ref 211–911)

## 2013-09-09 LAB — VITAMIN D 25 HYDROXY (VIT D DEFICIENCY, FRACTURES): VIT D 25 HYDROXY: 58 ng/mL (ref 30–89)

## 2013-12-06 ENCOUNTER — Other Ambulatory Visit: Payer: Self-pay | Admitting: Physician Assistant

## 2014-01-07 ENCOUNTER — Other Ambulatory Visit: Payer: Self-pay

## 2014-01-28 ENCOUNTER — Other Ambulatory Visit: Payer: Self-pay | Admitting: Physician Assistant

## 2014-04-08 ENCOUNTER — Encounter: Payer: Self-pay | Admitting: Physician Assistant

## 2014-04-08 MED ORDER — BUPROPION HCL ER (XL) 300 MG PO TB24: 300.0000 mg | ORAL_TABLET | Freq: Every morning | ORAL | Status: DC

## 2014-05-04 ENCOUNTER — Encounter: Payer: Self-pay | Admitting: Physician Assistant

## 2014-05-04 ENCOUNTER — Ambulatory Visit (INDEPENDENT_AMBULATORY_CARE_PROVIDER_SITE_OTHER): Payer: BLUE CROSS/BLUE SHIELD | Admitting: Physician Assistant

## 2014-05-04 VITALS — BP 110/70 | HR 80 | Temp 98.1°F | Resp 16 | Ht 64.0 in | Wt 128.0 lb

## 2014-05-04 DIAGNOSIS — F329 Major depressive disorder, single episode, unspecified: Secondary | ICD-10-CM

## 2014-05-04 DIAGNOSIS — E559 Vitamin D deficiency, unspecified: Secondary | ICD-10-CM

## 2014-05-04 DIAGNOSIS — R51 Headache: Secondary | ICD-10-CM

## 2014-05-04 DIAGNOSIS — M2660 Temporomandibular joint disorder, unspecified: Secondary | ICD-10-CM

## 2014-05-04 DIAGNOSIS — Z79899 Other long term (current) drug therapy: Secondary | ICD-10-CM

## 2014-05-04 DIAGNOSIS — K2100 Gastro-esophageal reflux disease with esophagitis, without bleeding: Secondary | ICD-10-CM

## 2014-05-04 DIAGNOSIS — M26609 Unspecified temporomandibular joint disorder, unspecified side: Secondary | ICD-10-CM

## 2014-05-04 DIAGNOSIS — R5383 Other fatigue: Secondary | ICD-10-CM

## 2014-05-04 DIAGNOSIS — R519 Headache, unspecified: Secondary | ICD-10-CM

## 2014-05-04 DIAGNOSIS — F419 Anxiety disorder, unspecified: Secondary | ICD-10-CM

## 2014-05-04 DIAGNOSIS — F32A Depression, unspecified: Secondary | ICD-10-CM

## 2014-05-04 LAB — BASIC METABOLIC PANEL WITH GFR
BUN: 12 mg/dL (ref 6–23)
CO2: 29 meq/L (ref 19–32)
Calcium: 9.4 mg/dL (ref 8.4–10.5)
Chloride: 102 mEq/L (ref 96–112)
Creat: 0.68 mg/dL (ref 0.50–1.10)
GFR, Est African American: >89 mL/min
GFR, Est Non African American: >89 mL/min
Glucose, Bld: 95 mg/dL (ref 70–99)
Potassium: 3.7 mEq/L (ref 3.5–5.3)
Sodium: 140 mEq/L (ref 135–145)

## 2014-05-04 LAB — HEPATIC FUNCTION PANEL
ALT: 13 U/L (ref 0–35)
AST: 17 U/L (ref 0–37)
Albumin: 4 g/dL (ref 3.5–5.2)
Alkaline Phosphatase: 49 U/L (ref 39–117)
BILIRUBIN INDIRECT: 0.3 mg/dL (ref 0.2–1.2)
BILIRUBIN TOTAL: 0.4 mg/dL (ref 0.2–1.2)
Bilirubin, Direct: 0.1 mg/dL (ref 0.0–0.3)
Total Protein: 6.6 g/dL (ref 6.0–8.3)

## 2014-05-04 LAB — CBC WITH DIFFERENTIAL/PLATELET
Basophils Absolute: 0.1 10*3/uL (ref 0.0–0.1)
Basophils Relative: 1 % (ref 0–1)
Eosinophils Absolute: 0.3 10*3/uL (ref 0.0–0.7)
Eosinophils Relative: 4 % (ref 0–5)
HCT: 37.7 % (ref 36.0–46.0)
HEMOGLOBIN: 13 g/dL (ref 12.0–15.0)
LYMPHS PCT: 28 % (ref 12–46)
Lymphs Abs: 1.8 10*3/uL (ref 0.7–4.0)
MCH: 33.6 pg (ref 26.0–34.0)
MCHC: 34.5 g/dL (ref 30.0–36.0)
MCV: 97.4 fL (ref 78.0–100.0)
MONO ABS: 1 10*3/uL (ref 0.1–1.0)
MONOS PCT: 15 % — AB (ref 3–12)
MPV: 9.1 fL (ref 8.6–12.4)
Neutro Abs: 3.4 10*3/uL (ref 1.7–7.7)
Neutrophils Relative %: 52 % (ref 43–77)
Platelets: 232 10*3/uL (ref 150–400)
RBC: 3.87 MIL/uL (ref 3.87–5.11)
RDW: 13 % (ref 11.5–15.5)
WBC: 6.5 10*3/uL (ref 4.0–10.5)

## 2014-05-04 LAB — LIPID PANEL
Cholesterol: 226 mg/dL — ABNORMAL HIGH (ref 0–200)
HDL: 82 mg/dL (ref >39)
LDL CALC: 116 mg/dL — AB (ref 0–99)
Total CHOL/HDL Ratio: 2.8 Ratio
Triglycerides: 142 mg/dL (ref <150)
VLDL: 28 mg/dL (ref 0–40)

## 2014-05-04 LAB — MAGNESIUM: MAGNESIUM: 1.9 mg/dL (ref 1.5–2.5)

## 2014-05-04 MED ORDER — DIAZEPAM 5 MG PO TABS: ORAL_TABLET | ORAL | Status: DC

## 2014-05-04 MED ORDER — CITALOPRAM HYDROBROMIDE 40 MG PO TABS: 40.0000 mg | ORAL_TABLET | Freq: Every day | ORAL | Status: DC

## 2014-05-04 NOTE — Patient Instructions (Addendum)
Cut wellbutrin in half, can stop prozac and start celexa 45m 1/2 pill immediately.  STOP GOODY POWDERS  Recommend that you see [facility]  Address: [address] [phone] Stress and Stress Management Stress is a normal reaction to life events. It is what you feel when life demands more than you are used to or more than you can handle. Some stress can be useful. For example, the stress reaction can help you catch the last bus of the day, study for a test, or meet a deadline at work. But stress that occurs too often or for too long can cause problems. It can affect your emotional health and interfere with relationships and normal daily activities. Too much stress can weaken your immune system and increase your risk for physical illness. If you already have a medical problem, stress can make it worse. CAUSES  All sorts of life events may cause stress. An event that causes stress for one person may not be stressful for another person. Major life events commonly cause stress. These may be positive or negative. Examples include losing your job, moving into a new home, getting married, having a baby, or losing a loved one. Less obvious life events may also cause stress, especially if they occur day after day or in combination. Examples include working long hours, driving in traffic, caring for children, being in debt, or being in a difficult relationship. SIGNS AND SYMPTOMS Stress may cause emotional symptoms including, the following:  Anxiety. This is feeling worried, afraid, on edge, overwhelmed, or out of control.  Anger. This is feeling irritated or impatient.  Depression. This is feeling sad, down, helpless, or guilty.  Difficulty focusing, remembering, or making decisions. Stress may cause physical symptoms, including the following:   Aches and pains. These may affect your head, neck, back, stomach, or other areas of your  body.  Tight muscles or clenched jaw.  Low energy or trouble sleeping. Stress may cause unhealthy behaviors, including the following:   Eating to feel better (overeating) or skipping meals.  Sleeping too little, too much, or both.  Working too much or putting off tasks (procrastination).  Smoking, drinking alcohol, or using drugs to feel better. DIAGNOSIS  Stress is diagnosed through an assessment by your health care provider. Your health care provider will ask questions about your symptoms and any stressful life events.Your health care provider will also ask about your medical history and may order blood tests or other tests. Certain medical conditions and medicine can cause physical symptoms similar to stress. Mental illness can cause emotional symptoms and unhealthy behaviors similar to stress. Your health care provider may refer you to a mental health professional for further evaluation.  TREATMENT  Stress management is the recommended treatment for stress.The goals of stress management are reducing stressful life events and coping with stress in healthy ways.  Techniques for reducing stressful life events include the following:  Stress identification. Self-monitor for stress and identify what causes stress for you. These skills may help you to avoid some stressful events.  Time management. Set your priorities, keep a calendar of events, and learn to say "no." These tools can help you avoid making too many commitments. Techniques for coping with stress include the following:  Rethinking the problem. Try to think realistically about stressful events rather than ignoring them or overreacting. Try to find the positives in a stressful situation rather than focusing on the negatives.  Exercise.  Physical exercise can release both physical and emotional tension. The key is to find a form of exercise you enjoy and do it regularly.  Relaxation techniques. These relax the body and mind.  Examples include yoga, meditation, tai chi, biofeedback, deep breathing, progressive muscle relaxation, listening to music, being out in nature, journaling, and other hobbies. Again, the key is to find one or more that you enjoy and can do regularly.  Healthy lifestyle. Eat a balanced diet, get plenty of sleep, and do not smoke. Avoid using alcohol or drugs to relax.  Strong support network. Spend time with family, friends, or other people you enjoy being around.Express your feelings and talk things over with someone you trust. Counseling or talktherapy with a mental health professional may be helpful if you are having difficulty managing stress on your own. Medicine is typically not recommended for the treatment of stress.Talk to your health care provider if you think you need medicine for symptoms of stress. HOME CARE INSTRUCTIONS  Keep all follow-up visits as directed by your health care provider.  Take all medicines as directed by your health care provider. SEEK MEDICAL CARE IF:  Your symptoms get worse or you start having new symptoms.  You feel overwhelmed by your problems and can no longer manage them on your own. SEEK IMMEDIATE MEDICAL CARE IF:  You feel like hurting yourself or someone else. Document Released: 09/04/2000 Document Revised: 07/26/2013 Document Reviewed: 11/03/2012 [facility] Patient Information 2015 [place], [place]. This information is not intended to replace advice given to you by your health care provider. Make sure you discuss any questions you have with your health care provider.

## 2014-05-04 NOTE — Progress Notes (Signed)
Subjective:    Patient ID: Haley Sparks, female    DOB: 07-09-1960, 54 y.o.   MRN: [id number]  HPI 54 y.o. WF with history of anxiety and depression presents to the office with a multitude of symptoms and crying/under distress due to increasing stress that is affected her performance at work and her life at home. Her grandmother raised her and recently passed, prior to this she was in a nursing home and the patient visited her daily, at her job she does claims on falls for the elderly and is not able to handle this constant reminder at this time. She can not concentrate, is crying randomly,, is unable to sleep and has taken xanax but states it is too much. She complains of a constant HA for several weeks, worse the last 3 days occipital throbbing headache. She will take goody powders for her HA, as of note she had recent EGD with her GI and had an ulcer, on dexilant currently. She also has neck pain, denies any numbness/tingling in her arms. She is on prozac and wellbutrin 300, denies SS symptoms.  Denies SI/HI  Current Outpatient Prescriptions on File Prior to Visit  Medication Sig Dispense Refill  . albuterol (PROVENTIL HFA;VENTOLIN HFA) 108 (90 BASE) MCG/ACT inhaler Inhale 2 puffs into the lungs every 6 (six) hours as needed for wheezing or shortness of breath. 1 Inhaler 2  . ALPRAZolam (XANAX) 0.5 MG tablet take 1/2 to 1 tablet by mouth three times a day if needed 90 tablet 1  . buPROPion (WELLBUTRIN XL) 300 MG 24 hr tablet Take 1 tablet (300 mg total) by mouth every morning. 90 tablet 1  . butalbital-acetaminophen-caffeine (FIORICET, ESGIC) 50-325-40 MG per tablet take 1 tablet by mouth every 1 to 2 hours if needed for headache 30 tablet 2  . FLUoxetine (PROZAC) 40 MG capsule Take 1 capsule (40 mg total) by mouth daily. 30 capsule 3  . meloxicam (MOBIC) 15 MG tablet Take one pill daily with food as needed for pain 30 tablet 3   No current facility-administered medications on file prior to  visit.   Past Medical History  Diagnosis Date  . Allergy   . Anxiety   . Depression   . History of bladder surgery     Review of Systems  Constitutional: Positive for activity change, appetite change and fatigue. Negative for fever, chills, diaphoresis and unexpected weight change.  HENT: Negative.   Eyes: Negative.   Respiratory: Negative.   Cardiovascular: Negative.   Gastrointestinal: Positive for nausea, abdominal pain and diarrhea. Negative for vomiting, constipation, blood in stool, abdominal distention, anal bleeding and rectal pain.  Endocrine: Positive for cold intolerance. Negative for heat intolerance, polydipsia, polyphagia and polyuria.  Genitourinary: Negative.   Musculoskeletal: Positive for myalgias and neck pain. Negative for back pain, joint swelling, arthralgias, gait problem and neck stiffness.  Neurological: Negative.   Psychiatric/Behavioral: Positive for suicidal ideas, sleep disturbance, dysphoric mood and decreased concentration. Negative for hallucinations, behavioral problems, confusion, self-injury and agitation. The patient is nervous/anxious. The patient is not hyperactive.        Objective:   Physical Exam  Constitutional: She is oriented to person, place, and time. She appears well-developed and well-nourished. She appears distressed.  HENT:  Head: Normocephalic and atraumatic.  Nose: Nose normal.  Mouth/Throat: Oropharynx is clear and moist. No oropharyngeal exudate.  Eyes: Conjunctivae are normal. Pupils are equal, round, and reactive to light.  Neck: Normal range of motion. Neck supple. No thyromegaly  present.  Cardiovascular: Normal rate, regular rhythm and normal heart sounds.   Pulmonary/Chest: Effort normal and breath sounds normal. She has no wheezes.  Abdominal: Soft. Bowel sounds are normal. There is tenderness (episgastric). There is no rebound and no guarding.  Musculoskeletal:  + neck pain, and tenderness with paraspinus muscles,  denies mid line tenderness, normal strength/neruo/vascular bilateral arms.   Lymphadenopathy:    She has no cervical adenopathy.  Neurological: She is alert and oriented to person, place, and time. She has normal reflexes. No cranial nerve deficit.  Skin: Skin is warm and dry. No rash noted.  Psychiatric: Judgment and thought content normal. Her mood appears anxious. Her affect is labile. Her speech is rapid and/or pressured. Cognition and memory are normal. She exhibits a depressed mood.       Assessment & Plan:  Tension-type headache, chronic/ TMJ syndrome with normal neuro- stop xanax and try valium at night for TMJ/Sleep Lie in darkened room and apply cold packs as needed for pain., Episodic therapy:prophylaxis with antidepressant therapy due to low frequency of pain.Side effect profile discussed in detail.Avoid alcohol and stressful situations as much as possible.May need cervical neck Xray if pain continues.  Patient reassured that neurodiagnostic workup not indicated from benign H&P.Follow up in 2 weeks.  Stomach ulcer history- STOP goody powders and avoid NSAIDS., continue dexilant.   Insomnia- due to stress, stop xanax and try valium, may act as muscle relaxer as well for stress/neck/TMJ  Depression- will switch prozac to celexa, if this does not work lexapro and then vibryd/priistiq, gave patient the number to [facility] treatment center and encouraged counseling, will fill out FMLA for distress/decreased concentration from yesterday for 2 weeks from today, will do close follow up with mychart/visits, stress management techniques discussed, increase water, good sleep hygiene discussed, increase exercise, and increase veggies.   Will check labs since patient is over due, rule out TSH, and other abnormalities.   OVER 40 minutes of exam, counseling, chart review, referral performed

## 2014-05-05 ENCOUNTER — Encounter: Payer: Self-pay | Admitting: Internal Medicine

## 2014-05-05 LAB — VITAMIN D 25 HYDROXY (VIT D DEFICIENCY, FRACTURES): Vit D, 25-Hydroxy: 30 ng/mL (ref 30–100)

## 2014-05-05 LAB — TSH: TSH: 1.031 u[IU]/mL (ref 0.350–4.500)

## 2014-05-10 ENCOUNTER — Other Ambulatory Visit: Payer: Self-pay | Admitting: Internal Medicine

## 2014-05-10 DIAGNOSIS — R51 Headache: Principal | ICD-10-CM

## 2014-05-10 DIAGNOSIS — R519 Headache, unspecified: Secondary | ICD-10-CM

## 2014-05-16 ENCOUNTER — Encounter: Payer: Self-pay | Admitting: Physician Assistant

## 2014-05-16 ENCOUNTER — Ambulatory Visit: Payer: Self-pay | Admitting: Physician Assistant

## 2014-05-18 ENCOUNTER — Encounter: Payer: Self-pay | Admitting: Physician Assistant

## 2014-05-18 ENCOUNTER — Ambulatory Visit (INDEPENDENT_AMBULATORY_CARE_PROVIDER_SITE_OTHER): Payer: BLUE CROSS/BLUE SHIELD | Admitting: Physician Assistant

## 2014-05-18 VITALS — BP 112/64 | HR 102 | Temp 98.4°F | Resp 16 | Ht 64.0 in | Wt 126.0 lb

## 2014-05-18 DIAGNOSIS — F4541 Pain disorder exclusively related to psychological factors: Secondary | ICD-10-CM | POA: Insufficient documentation

## 2014-05-18 DIAGNOSIS — F419 Anxiety disorder, unspecified: Secondary | ICD-10-CM

## 2014-05-18 DIAGNOSIS — R5383 Other fatigue: Secondary | ICD-10-CM

## 2014-05-18 DIAGNOSIS — R51 Headache: Secondary | ICD-10-CM

## 2014-05-18 DIAGNOSIS — R519 Headache, unspecified: Secondary | ICD-10-CM

## 2014-05-18 DIAGNOSIS — F32A Depression, unspecified: Secondary | ICD-10-CM

## 2014-05-18 DIAGNOSIS — F329 Major depressive disorder, single episode, unspecified: Secondary | ICD-10-CM

## 2014-05-18 DIAGNOSIS — K259 Gastric ulcer, unspecified as acute or chronic, without hemorrhage or perforation: Secondary | ICD-10-CM

## 2014-05-18 DIAGNOSIS — M542 Cervicalgia: Secondary | ICD-10-CM

## 2014-05-18 MED ORDER — DIAZEPAM 5 MG PO TABS: ORAL_TABLET | ORAL | Status: DC

## 2014-05-18 NOTE — Progress Notes (Signed)
Assessment and Plan: Depression/anxiety- normal neuro- continue the celexa 40mg  and wellbutrin 150mg - try to get counseling through work, due to inattention/memory/concentration issues will keep out of work for 2 more weeks.  Insomnia/HA- continue valium 5mg  daily GERD- continue PPI for now with recent ulcer, she is off goody's Vitamin D - now on 6000 from 2000.   HPI 54 y.o.female presents for follow up for depression/anxiety. She was seen 05/04/2014 for constant HA, GERD, neck pain, and worsening depression with uncontrollable crying,  Unable to work/concentrate, not wanting to get out of bed. Partly attributed to the passing of her mother to whom she was very close. She was switched from prozac to celexa and an FMLA formed was filled out that was from 05/03/2014 expired 05/16/2014. She was switched from xanax to valium 5mg  for sleep/HA and was told to stop goody powders due to recent + ulcer on EGD. She has called [facility] center but said that the copay was too much, but she is trying to get in with a counselor at work.   No Known Allergies    Current Outpatient Prescriptions on File Prior to Visit  Medication Sig Dispense Refill  . albuterol (PROVENTIL HFA;VENTOLIN HFA) 108 (90 BASE) MCG/ACT inhaler Inhale 2 puffs into the lungs every 6 (six) hours as needed for wheezing or shortness of breath. 1 Inhaler 2  . ALPRAZolam (XANAX) 0.5 MG tablet take 1/2 to 1 tablet by mouth three times a day if needed 90 tablet 1  . buPROPion (WELLBUTRIN XL) 300 MG 24 hr tablet Take 1 tablet (300 mg total) by mouth every morning. 90 tablet 1  . butalbital-acetaminophen-caffeine (FIORICET, ESGIC) 50-325-40 MG per tablet take 1 tablet by mouth every 1 to 2 hours if needed for headache 30 tablet 0  . citalopram (CELEXA) 40 MG tablet Take 1 tablet (40 mg total) by mouth daily. 30 tablet 2  . diazepam (VALIUM) 5 MG tablet 1/2-1 po QHS for sleep/headache 30 tablet 0  . estradiol (ESTRACE) 2 MG tablet Take 2 mg  by mouth daily. 1/2 to 1 tablet daily    . Lubiprostone (AMITIZA PO) Take by mouth.    . meloxicam (MOBIC) 15 MG tablet Take one pill daily with food as needed for pain 30 tablet 3   No current facility-administered medications on file prior to visit.    ROS: all negative except above.   Physical Exam: Filed Weights   05/18/14 1516  Weight: 126 lb (57.153 kg)   BP 112/64 mmHg  Pulse 102  Temp(Src) 98.4 F (36.9 C) (Temporal)  Resp 16  Ht 5\' 4"  (1.626 m)  Wt 126 lb (57.153 kg)  BMI 21.62 kg/m2 General Appearance: Well nourished, in no apparent distress. Eyes: PERRLA, EOMs, conjunctiva no swelling or erythema Sinuses: No Frontal/maxillary tenderness ENT/Mouth: Ext aud canals clear, TMs without erythema, bulging. No erythema, swelling, or exudate on post pharynx.  Tonsils not swollen or erythematous. Hearing normal.  Neck: Supple, thyroid normal.  Respiratory: Respiratory effort normal, BS equal bilaterally without rales, rhonchi, wheezing or stridor.  Cardio: RRR with no MRGs. Brisk peripheral pulses without edema.  Abdomen: Soft, + BS.  Non tender, no guarding, rebound, hernias, masses. Lymphatics: Non tender without lymphadenopathy.  Musculoskeletal: Full ROM, 5/5 strength, normal gait.  Skin: Warm, dry without rashes, lesions, ecchymosis.  Neuro: Cranial nerves intact. Normal muscle tone, no cerebellar symptoms. Sensation intact.  Psych: Awake and oriented X 3, normal affect, Insight and Judgment appropriate.     Vicie Mutters,  PA-C 3:24 PM [facility]

## 2014-05-18 NOTE — Patient Instructions (Addendum)
Can do wellbutrin 300mg  with celexa 40 but have to monitor for symptoms below Please see counselor Will take out of work for 2 more weeks and reevaluate 2 weeks.   Serotonin Syndrome Serotonin is a brain chemical that regulates the nervous system. Some kinds of drugs increase the amount of serotonin in your body. Drugs that increase the serotonin in your body include:   Anti-depressant medications.  St. John's wort.  Recreational drugs.  Migraine medicines.  Some pain medicines. SYMPTOMS Combining these drugs increases the risk that you will become ill with a toxic condition called serotonin syndrome.  Symptoms of too much serotonin include:  Confusion.  Agitation.  Weakness.  Insomnia.  Fever.  Sweats. Other symptoms that may develop include:  Shakiness.  Muscle spasms.  Seizures. TREATMENT  Hospital treatment is often needed until the effects are controlled.  Avoiding the combination of medicines listed above is recommended.  Check with your doctor if you are concerned about your medicine or the side effects. Document Released: 04/18/2004 Document Revised: 06/03/2011 Document Reviewed: 03/11/2005 [facility] Patient Information 2015 [place], [place]. This information is not intended to replace advice given to you by your health care provider. Make sure you discuss any questions you have with your health care provider.

## 2014-05-31 ENCOUNTER — Ambulatory Visit (INDEPENDENT_AMBULATORY_CARE_PROVIDER_SITE_OTHER): Payer: BLUE CROSS/BLUE SHIELD | Admitting: Physician Assistant

## 2014-05-31 VITALS — BP 110/60 | HR 84 | Temp 97.9°F | Resp 16 | Ht 64.0 in | Wt 126.0 lb

## 2014-05-31 DIAGNOSIS — F329 Major depressive disorder, single episode, unspecified: Secondary | ICD-10-CM

## 2014-05-31 DIAGNOSIS — R413 Other amnesia: Secondary | ICD-10-CM

## 2014-05-31 DIAGNOSIS — F419 Anxiety disorder, unspecified: Secondary | ICD-10-CM

## 2014-05-31 DIAGNOSIS — F32A Depression, unspecified: Secondary | ICD-10-CM

## 2014-05-31 MED ORDER — AMPHETAMINE-DEXTROAMPHETAMINE 10 MG PO TABS: 10.0000 mg | ORAL_TABLET | Freq: Two times a day (BID) | ORAL | Status: DC

## 2014-05-31 NOTE — Patient Instructions (Signed)
Can see call to see if  Dr. Thayer Ohm is in your network.  Address: [address], [place], [place] [address]  [phone]   He can do neuro psych testing.   Stress and Stress Management Stress is a normal reaction to life events. It is what you feel when life demands more than you are used to or more than you can handle. Some stress can be useful. For example, the stress reaction can help you catch the last bus of the day, study for a test, or meet a deadline at work. But stress that occurs too often or for too long can cause problems. It can affect your emotional health and interfere with relationships and normal daily activities. Too much stress can weaken your immune system and increase your risk for physical illness. If you already have a medical problem, stress can make it worse. CAUSES  All sorts of life events may cause stress. An event that causes stress for one person may not be stressful for another person. Major life events commonly cause stress. These may be positive or negative. Examples include losing your job, moving into a new home, getting married, having a baby, or losing a loved one. Less obvious life events may also cause stress, especially if they occur day after day or in combination. Examples include working long hours, driving in traffic, caring for children, being in debt, or being in a difficult relationship. SIGNS AND SYMPTOMS Stress may cause emotional symptoms including, the following:  Anxiety. This is feeling worried, afraid, on edge, overwhelmed, or out of control.  Anger. This is feeling irritated or impatient.  Depression. This is feeling sad, down, helpless, or guilty.  Difficulty focusing, remembering, or making decisions. Stress may cause physical symptoms, including the following:   Aches and pains. These may affect your head, neck, back, stomach, or other areas of your body.  Tight muscles or clenched jaw.  Low energy or trouble sleeping. Stress  may cause unhealthy behaviors, including the following:   Eating to feel better (overeating) or skipping meals.  Sleeping too little, too much, or both.  Working too much or putting off tasks (procrastination).  Smoking, drinking alcohol, or using drugs to feel better. DIAGNOSIS  Stress is diagnosed through an assessment by your health care provider. Your health care provider will ask questions about your symptoms and any stressful life events.Your health care provider will also ask about your medical history and may order blood tests or other tests. Certain medical conditions and medicine can cause physical symptoms similar to stress. Mental illness can cause emotional symptoms and unhealthy behaviors similar to stress. Your health care provider may refer you to a mental health professional for further evaluation.  TREATMENT  Stress management is the recommended treatment for stress.The goals of stress management are reducing stressful life events and coping with stress in healthy ways.  Techniques for reducing stressful life events include the following:  Stress identification. Self-monitor for stress and identify what causes stress for you. These skills may help you to avoid some stressful events.  Time management. Set your priorities, keep a calendar of events, and learn to say "no." These tools can help you avoid making too many commitments. Techniques for coping with stress include the following:  Rethinking the problem. Try to think realistically about stressful events rather than ignoring them or overreacting. Try to find the positives in a stressful situation rather than focusing on the negatives.  Exercise. Physical exercise can release both physical  and emotional tension. The key is to find a form of exercise you enjoy and do it regularly.  Relaxation techniques. These relax the body and mind. Examples include yoga, meditation, tai chi, biofeedback, deep breathing, progressive  muscle relaxation, listening to music, being out in nature, journaling, and other hobbies. Again, the key is to find one or more that you enjoy and can do regularly.  Healthy lifestyle. Eat a balanced diet, get plenty of sleep, and do not smoke. Avoid using alcohol or drugs to relax.  Strong support network. Spend time with family, friends, or other people you enjoy being around.Express your feelings and talk things over with someone you trust. Counseling or talktherapy with a mental health professional may be helpful if you are having difficulty managing stress on your own. Medicine is typically not recommended for the treatment of stress.Talk to your health care provider if you think you need medicine for symptoms of stress. HOME CARE INSTRUCTIONS  Keep all follow-up visits as directed by your health care provider.  Take all medicines as directed by your health care provider. SEEK MEDICAL CARE IF:  Your symptoms get worse or you start having new symptoms.  You feel overwhelmed by your problems and can no longer manage them on your own. SEEK IMMEDIATE MEDICAL CARE IF:  You feel like hurting yourself or someone else. Document Released: 09/04/2000 Document Revised: 07/26/2013 Document Reviewed: 11/03/2012 [facility] Patient Information 2015 [place], [place]. This information is not intended to replace advice given to you by your health care provider. Make sure you discuss any questions you have with your health care provider.

## 2014-05-31 NOTE — Progress Notes (Signed)
Assessment and Plan: Depression/anxiety/grief reaction/memory issues- normal neuro other than memory issues- will keep out for 1 more week, return to work 06/08/2014, but have explained that she needs to follow up with psych or get neuro psych testing if she would like any further FMLA, if any changes in symptoms will persue imaging/ further testing. Follow up with mood treatment center. Will try low dose of adderall 10mg  BID   HPI 54 y.o.female presents for follow up. She states that she has followed up with the mood treatment center today because she felt that through her work was not good. She states she is having trouble focusing and is requesting something to help with this. She is having trouble completing tasks, she will put in laundry and not turn it on, she will not complete task. She has been on celexa 40mg  and on the 150mg  of the wellbutrin. She has been out of work since 05/03/2014. She was switched from xanax to valium for sleeping and states she is doing well with that.  She states she is callingShe is not having any neurological accompaniments.   Past Medical History  Diagnosis Date  . Allergy   . Anxiety   . Depression   . History of bladder surgery      Allergies  Allergen Reactions  . Brintellix [Vortioxetine] Nausea Only    Current Outpatient Prescriptions on File Prior to Visit  Medication Sig Dispense Refill  . albuterol (PROVENTIL HFA;VENTOLIN HFA) 108 (90 BASE) MCG/ACT inhaler Inhale 2 puffs into the lungs every 6 (six) hours as needed for wheezing or shortness of breath. 1 Inhaler 2  . buPROPion (WELLBUTRIN XL) 300 MG 24 hr tablet Take 1 tablet (300 mg total) by mouth every morning. 90 tablet 1  . butalbital-acetaminophen-caffeine (FIORICET, ESGIC) 50-325-40 MG per tablet take 1 tablet by mouth every 1 to 2 hours if needed for headache 30 tablet 0  . citalopram (CELEXA) 40 MG tablet Take 1 tablet (40 mg total) by mouth daily. 30 tablet 2  . diazepam (VALIUM) 5 MG  tablet 1/2-1 po BID for sleep/anxiety 60 tablet 0  . estradiol (ESTRACE) 2 MG tablet Take 2 mg by mouth daily. 1/2 to 1 tablet daily    . Lubiprostone (AMITIZA PO) Take by mouth.    . meloxicam (MOBIC) 15 MG tablet Take one pill daily with food as needed for pain 30 tablet 3   No current facility-administered medications on file prior to visit.    ROS: all negative except above.   Physical Exam: Filed Weights   05/31/14 1446  Weight: 126 lb (57.153 kg)   BP 110/60 mmHg  Pulse 84  Temp(Src) 97.9 F (36.6 C)  Resp 16  Ht 5\' 4"  (1.626 m)  Wt 126 lb (57.153 kg)  BMI 21.62 kg/m2 General Appearance: Well nourished, in no apparent distress. Eyes: PERRLA, EOMs, conjunctiva no swelling or erythema Sinuses: No Frontal/maxillary tenderness ENT/Mouth: Ext aud canals clear, TMs without erythema, bulging. No erythema, swelling, or exudate on post pharynx.  Tonsils not swollen or erythematous. Hearing normal.  Neck: Supple, thyroid normal.  Respiratory: Respiratory effort normal, BS equal bilaterally without rales, rhonchi, wheezing or stridor.  Cardio: RRR with no MRGs. Brisk peripheral pulses without edema.  Abdomen: Soft, + BS.  Non tender, no guarding, rebound, hernias, masses. Lymphatics: Non tender without lymphadenopathy.  Musculoskeletal: Full ROM, 5/5 strength, normal gait.  Skin: Warm, dry without rashes, lesions, ecchymosis.  Neuro: Cranial nerves intact. Normal muscle tone, no cerebellar symptoms. Sensation  intact.  Psych: Awake and oriented X 3, normal affect, Insight and Judgment appropriate.     Vicie Mutters, PA-C 2:58 PM [facility]

## 2014-06-01 ENCOUNTER — Encounter: Payer: Self-pay | Admitting: Physician Assistant

## 2014-06-01 LAB — RPR

## 2014-06-02 LAB — LYME ABY, WSTRN BLT IGG & IGM W/BANDS
B burgdorferi IgG Abs (IB): NEGATIVE
B burgdorferi IgM Abs (IB): NEGATIVE
LYME DISEASE 23 KD IGG: NONREACTIVE
LYME DISEASE 28 KD IGG: NONREACTIVE
LYME DISEASE 39 KD IGG: NONREACTIVE
LYME DISEASE 45 KD IGG: NONREACTIVE
LYME DISEASE 66 KD IGG: NONREACTIVE
Lyme Disease 18 kD IgG: NONREACTIVE
Lyme Disease 23 kD IgM: NONREACTIVE
Lyme Disease 30 kD IgG: NONREACTIVE
Lyme Disease 39 kD IgM: NONREACTIVE
Lyme Disease 41 kD IgG: NONREACTIVE
Lyme Disease 41 kD IgM: NONREACTIVE
Lyme Disease 58 kD IgG: NONREACTIVE
Lyme Disease 93 kD IgG: NONREACTIVE

## 2014-06-02 LAB — CERULOPLASMIN: Ceruloplasmin: 31 mg/dL (ref 18–53)

## 2014-06-02 LAB — ROCKY MTN SPOTTED FVR ABS PNL(IGG+IGM)
RMSF IGG: 0.08 IV
RMSF IgM: 0.09 IV

## 2014-06-02 LAB — COPPER, SERUM: COPPER: 122 ug/dL (ref 70–175)

## 2014-06-28 ENCOUNTER — Ambulatory Visit (INDEPENDENT_AMBULATORY_CARE_PROVIDER_SITE_OTHER): Payer: BLUE CROSS/BLUE SHIELD | Admitting: Physician Assistant

## 2014-06-28 ENCOUNTER — Encounter: Payer: Self-pay | Admitting: Physician Assistant

## 2014-06-28 VITALS — BP 120/78 | HR 76 | Temp 97.7°F | Resp 16 | Ht 64.0 in | Wt 125.0 lb

## 2014-06-28 DIAGNOSIS — F419 Anxiety disorder, unspecified: Secondary | ICD-10-CM

## 2014-06-28 DIAGNOSIS — F329 Major depressive disorder, single episode, unspecified: Secondary | ICD-10-CM

## 2014-06-28 DIAGNOSIS — F32A Depression, unspecified: Secondary | ICD-10-CM

## 2014-06-28 DIAGNOSIS — R413 Other amnesia: Secondary | ICD-10-CM

## 2014-06-28 MED ORDER — AMPHETAMINE-DEXTROAMPHETAMINE 20 MG PO TABS: 10.0000 mg | ORAL_TABLET | Freq: Two times a day (BID) | ORAL | Status: DC

## 2014-06-28 MED ORDER — CITALOPRAM HYDROBROMIDE 40 MG PO TABS: 40.0000 mg | ORAL_TABLET | Freq: Every day | ORAL | Status: DC

## 2014-06-28 NOTE — Patient Instructions (Signed)
1) Increase wellbutrin to 300 mg but watch for symptoms below 2) I'm going to give you Adderall 20mg  , start at 1/2 a pill up to twice a day but if you are having issues you can increase to 1 pill twice a day.   If you have a big issues remembering the second pill or feel that you really need it we can switch to an extended release.   Serotonin Syndrome Serotonin is a brain chemical that regulates the nervous system. Some kinds of drugs increase the amount of serotonin in your body. Drugs that increase the serotonin in your body include:   Anti-depressant medications.  St. John's wort.  Recreational drugs.  Migraine medicines.  Some pain medicines. SYMPTOMS Combining these drugs increases the risk that you will become ill with a toxic condition called serotonin syndrome.  Symptoms of too much serotonin include:  Confusion.  Agitation.  Weakness.  Insomnia.  Fever.  Sweats. Other symptoms that may develop include:  Shakiness.  Muscle spasms.  Seizures. TREATMENT  Hospital treatment is often needed until the effects are controlled.  Avoiding the combination of medicines listed above is recommended.  Check with your doctor if you are concerned about your medicine or the side effects. Document Released: 04/18/2004 Document Revised: 06/03/2011 Document Reviewed: 03/11/2005 [facility] Patient Information 2015 [place], [place]. This information is not intended to replace advice given to you by your health care provider. Make sure you discuss any questions you have with your health care provider.

## 2014-06-28 NOTE — Progress Notes (Signed)
Assessment and Plan: Depression/anxiety/grief reaction/memory issues- normal neuro- continue medications, can increase adderall to 20mg  BId, Follow up with mood treatment center. Call if any new symptoms.    Future Appointments Date Time Provider [place]  09/12/2014 9:00 AM Vicie Mutters, PA-C [id number] None    HPI 54 y.o.female with history of anxiety and depression presents for follow up on her symptoms. We have been following her since Feb 10th for worsening depression/anxiety with accompaniments of memory issues/concentration issues. She has had negative labs, and normal neuro. She is going to get neuro psych testing per her request. She has been out of work but started back on the 23 and feels that it is going well. She is seeing a counselor at the mood treatment center and states this is helping. She has been on celexa since Feb and has been on Prozac before that for 1 year, we decreased her wellbutrin from 300 to 150 but she would like to go back up and we added adderall 10mg  last visit that is helping. .   Past Medical History  Diagnosis Date  . Allergy   . Anxiety   . Depression   . History of bladder surgery      Allergies  Allergen Reactions  . Brintellix [Vortioxetine] Nausea Only      Current Outpatient Prescriptions on File Prior to Visit  Medication Sig Dispense Refill  . albuterol (PROVENTIL HFA;VENTOLIN HFA) 108 (90 BASE) MCG/ACT inhaler Inhale 2 puffs into the lungs every 6 (six) hours as needed for wheezing or shortness of breath. 1 Inhaler 2  . amphetamine-dextroamphetamine (ADDERALL) 10 MG tablet Take 1 tablet (10 mg total) by mouth 2 (two) times daily with a meal. 60 tablet 0  . buPROPion (WELLBUTRIN XL) 300 MG 24 hr tablet Take 1 tablet (300 mg total) by mouth every morning. 90 tablet 1  . butalbital-acetaminophen-caffeine (FIORICET, ESGIC) 50-325-40 MG per tablet take 1 tablet by mouth every 1 to 2 hours if needed for headache 30 tablet 0  .  citalopram (CELEXA) 40 MG tablet Take 1 tablet (40 mg total) by mouth daily. 30 tablet 2  . diazepam (VALIUM) 5 MG tablet 1/2-1 po BID for sleep/anxiety 60 tablet 0  . estradiol (ESTRACE) 2 MG tablet Take 2 mg by mouth daily. 1/2 to 1 tablet daily    . Lubiprostone (AMITIZA PO) Take by mouth.    . meloxicam (MOBIC) 15 MG tablet Take one pill daily with food as needed for pain 30 tablet 3   No current facility-administered medications on file prior to visit.    ROS: all negative except above.   Physical Exam: Filed Weights   06/28/14 1640  Weight: 125 lb (56.7 kg)   BP 120/78 mmHg  Pulse 76  Temp(Src) 97.7 F (36.5 C)  Resp 16  Ht 5\' 4"  (1.626 m)  Wt 125 lb (56.7 kg)  BMI 21.45 kg/m2 General Appearance: Well nourished, in no apparent distress. Eyes: PERRLA, EOMs, conjunctiva no swelling or erythema Sinuses: No Frontal/maxillary tenderness ENT/Mouth: Ext aud canals clear, TMs without erythema, bulging. No erythema, swelling, or exudate on post pharynx.  Tonsils not swollen or erythematous. Hearing normal.  Neck: Supple, thyroid normal.  Respiratory: Respiratory effort normal, BS equal bilaterally without rales, rhonchi, wheezing or stridor.  Cardio: RRR with no MRGs. Brisk peripheral pulses without edema.  Abdomen: Soft, + BS.  Non tender, no guarding, rebound, hernias, masses. Lymphatics: Non tender without lymphadenopathy.  Musculoskeletal: Full ROM, 5/5 strength, normal gait.  Skin:  Warm, dry without rashes, lesions, ecchymosis.  Neuro: Cranial nerves intact. Normal muscle tone, no cerebellar symptoms. Sensation intact.  Psych: Awake and oriented X 3, normal affect, Insight and Judgment appropriate.     Vicie Mutters, PA-C 5:06 PM [facility]

## 2014-08-18 ENCOUNTER — Encounter: Payer: Self-pay | Admitting: Physician Assistant

## 2014-08-18 ENCOUNTER — Other Ambulatory Visit: Payer: Self-pay | Admitting: Physician Assistant

## 2014-08-18 DIAGNOSIS — R51 Headache: Principal | ICD-10-CM

## 2014-08-18 DIAGNOSIS — R519 Headache, unspecified: Secondary | ICD-10-CM

## 2014-08-20 ENCOUNTER — Other Ambulatory Visit: Payer: Self-pay | Admitting: Physician Assistant

## 2014-09-12 ENCOUNTER — Encounter: Payer: Self-pay | Admitting: Physician Assistant

## 2014-09-12 ENCOUNTER — Ambulatory Visit (INDEPENDENT_AMBULATORY_CARE_PROVIDER_SITE_OTHER): Payer: BLUE CROSS/BLUE SHIELD | Admitting: Physician Assistant

## 2014-09-12 VITALS — BP 120/78 | HR 74 | Temp 97.9°F | Resp 16 | Ht 64.0 in | Wt 120.0 lb

## 2014-09-12 DIAGNOSIS — D239 Other benign neoplasm of skin, unspecified: Secondary | ICD-10-CM

## 2014-09-12 DIAGNOSIS — K2100 Gastro-esophageal reflux disease with esophagitis, without bleeding: Secondary | ICD-10-CM

## 2014-09-12 DIAGNOSIS — R51 Headache: Secondary | ICD-10-CM

## 2014-09-12 DIAGNOSIS — R519 Headache, unspecified: Secondary | ICD-10-CM

## 2014-09-12 DIAGNOSIS — E785 Hyperlipidemia, unspecified: Secondary | ICD-10-CM

## 2014-09-12 DIAGNOSIS — F32A Depression, unspecified: Secondary | ICD-10-CM

## 2014-09-12 DIAGNOSIS — E559 Vitamin D deficiency, unspecified: Secondary | ICD-10-CM

## 2014-09-12 DIAGNOSIS — F419 Anxiety disorder, unspecified: Secondary | ICD-10-CM

## 2014-09-12 DIAGNOSIS — Z79899 Other long term (current) drug therapy: Secondary | ICD-10-CM

## 2014-09-12 DIAGNOSIS — T7840XD Allergy, unspecified, subsequent encounter: Secondary | ICD-10-CM

## 2014-09-12 DIAGNOSIS — Z86018 Personal history of other benign neoplasm: Secondary | ICD-10-CM | POA: Insufficient documentation

## 2014-09-12 DIAGNOSIS — F329 Major depressive disorder, single episode, unspecified: Secondary | ICD-10-CM

## 2014-09-12 LAB — MAGNESIUM: Magnesium: 1.7 mg/dL (ref 1.5–2.5)

## 2014-09-12 LAB — BASIC METABOLIC PANEL WITH GFR
BUN: 12 mg/dL (ref 6–23)
CHLORIDE: 101 meq/L (ref 96–112)
CO2: 28 meq/L (ref 19–32)
CREATININE: 0.7 mg/dL (ref 0.50–1.10)
Calcium: 9.1 mg/dL (ref 8.4–10.5)
GFR, Est African American: >89 mL/min
GFR, Est Non African American: >89 mL/min
Glucose, Bld: 78 mg/dL (ref 70–99)
Potassium: 3.8 mEq/L (ref 3.5–5.3)
Sodium: 137 mEq/L (ref 135–145)

## 2014-09-12 LAB — CBC WITH DIFFERENTIAL/PLATELET
BASOS ABS: 0 10*3/uL (ref 0.0–0.1)
BASOS PCT: 1 % (ref 0–1)
EOS ABS: 0.1 10*3/uL (ref 0.0–0.7)
Eosinophils Relative: 2 % (ref 0–5)
HCT: 36.9 % (ref 36.0–46.0)
Hemoglobin: 12.2 g/dL (ref 12.0–15.0)
Lymphocytes Relative: 39 % (ref 12–46)
Lymphs Abs: 1.5 10*3/uL (ref 0.7–4.0)
MCH: 32.4 pg (ref 26.0–34.0)
MCHC: 33.1 g/dL (ref 30.0–36.0)
MCV: 97.9 fL (ref 78.0–100.0)
MONO ABS: 0.4 10*3/uL (ref 0.1–1.0)
MPV: 9 fL (ref 8.6–12.4)
Monocytes Relative: 11 % (ref 3–12)
NEUTROS ABS: 1.8 10*3/uL (ref 1.7–7.7)
NEUTROS PCT: 47 % (ref 43–77)
PLATELETS: 257 10*3/uL (ref 150–400)
RBC: 3.77 MIL/uL — ABNORMAL LOW (ref 3.87–5.11)
RDW: 13 % (ref 11.5–15.5)
WBC: 3.9 10*3/uL — ABNORMAL LOW (ref 4.0–10.5)

## 2014-09-12 LAB — TSH: TSH: 1.247 u[IU]/mL (ref 0.350–4.500)

## 2014-09-12 LAB — HEPATIC FUNCTION PANEL
ALBUMIN: 4.2 g/dL (ref 3.5–5.2)
ALT: 11 U/L (ref 0–35)
AST: 15 U/L (ref 0–37)
Alkaline Phosphatase: 42 U/L (ref 39–117)
Bilirubin, Direct: 0.1 mg/dL (ref 0.0–0.3)
Indirect Bilirubin: 0.3 mg/dL (ref 0.2–1.2)
TOTAL PROTEIN: 6.5 g/dL (ref 6.0–8.3)
Total Bilirubin: 0.4 mg/dL (ref 0.2–1.2)

## 2014-09-12 LAB — LIPID PANEL
Cholesterol: 195 mg/dL (ref 0–200)
HDL: 89 mg/dL (ref >=46)
LDL CALC: 93 mg/dL (ref 0–99)
TRIGLYCERIDES: 66 mg/dL (ref <150)
Total CHOL/HDL Ratio: 2.2 Ratio
VLDL: 13 mg/dL (ref 0–40)

## 2014-09-12 MED ORDER — AMPHETAMINE-DEXTROAMPHETAMINE 20 MG PO TABS: 10.0000 mg | ORAL_TABLET | Freq: Two times a day (BID) | ORAL | Status: DC

## 2014-09-12 MED ORDER — FLUOXETINE HCL 40 MG PO CAPS: 40.0000 mg | ORAL_CAPSULE | Freq: Every day | ORAL | Status: DC

## 2014-09-12 MED ORDER — DIAZEPAM 5 MG PO TABS: ORAL_TABLET | ORAL | Status: DC

## 2014-09-12 MED ORDER — MELOXICAM 15 MG PO TABS: ORAL_TABLET | ORAL | Status: DC

## 2014-09-12 MED ORDER — ESTRADIOL 2 MG PO TABS: 2.0000 mg | ORAL_TABLET | Freq: Every day | ORAL | Status: DC

## 2014-09-12 MED ORDER — ALPRAZOLAM 0.5 MG PO TABS: 0.5000 mg | ORAL_TABLET | Freq: Three times a day (TID) | ORAL | Status: DC | PRN

## 2014-09-12 NOTE — Progress Notes (Signed)
Complete Physical  Assessment and Plan: 1. Nonintractable headache, unspecified chronicity pattern, unspecified headache type Avoid triggers, doing well.   2. Depression Depression- continue medications, stress management techniques discussed, increase water, good sleep hygiene discussed, increase exercise, and increase veggies.  - TSH - Urinalysis, Routine w reflex microscopic (not at [facility]) - Microalbumin / creatinine urine ratio  3. Allergy, subsequent encounter Continue OTC allergy pills  4. Hyperlipidemia LDL goal <100 -continue medications, check lipids, decrease fatty foods, increase activity.  - CBC with Differential/Platelet - BASIC METABOLIC PANEL WITH GFR - Hepatic function panel - Lipid panel  5. Anxiety Will give 30 xanax to take sparingly during the day continue medications, stress management techniques discussed, increase water, good sleep hygiene discussed, increase exercise, and increase veggies.   6. Gastroesophageal reflux disease with esophagitis Continue PPI/H2 blocker, diet discussed  7. Vitamin D deficiency - Vit D  25 hydroxy (rtn osteoporosis monitoring)  8. Medication management - Magnesium  9. Dysplastic nevus Will continue to monitor, wear sun tan lotion   Discussed med's effects and SE's. Screening labs and tests as requested with regular follow-up as recommended.  HPI 54 y.o. female  presents for a complete physical. Her blood pressure has been controlled at home, today their BP is BP: 120/78 mmHg She does workout. She denies chest pain, shortness of breath, dizziness.  She is not on cholesterol medication and denies myalgias. Her cholesterol is at goal. The cholesterol last visit was:   Lab Results  Component Value Date   CHOL 226* 05/04/2014   HDL 82 05/04/2014   LDLCALC 116* 05/04/2014   TRIG 142 05/04/2014   CHOLHDL 2.8 05/04/2014   Patient is on Vitamin D supplement,  Lab Results  Component Value Date   VD25OH 30 05/04/2014    Lab Results  Component Value Date   HGBA1C 5.0 09/08/2013   Patient has been having menopausal symptoms and was started on estrogen, which has helped her hot flashes as well as her osteopenia, she is not on a BASA yet as we discussed.  She has a history of depression and anxiety, worsened by the death of her grandmother and her oldest daughter getting married and moving to [place]. She had some pseudodementia, started on adderall, wellbutrin, and celexa. Had normal testing, and is doing better but is interested in switching to prozac because of sexual side effects and she can get it free from work. Started exercise class and will be visiting daughter in [place] in July.  History of dysplastic venus with moderate atypia.   Current Medications:  Current Outpatient Prescriptions on File Prior to Visit  Medication Sig Dispense Refill  . albuterol (PROVENTIL HFA;VENTOLIN HFA) 108 (90 BASE) MCG/ACT inhaler Inhale 2 puffs into the lungs every 6 (six) hours as needed for wheezing or shortness of breath. 1 Inhaler 2  . amphetamine-dextroamphetamine (ADDERALL) 20 MG tablet Take 0.5 tablets (10 mg total) by mouth 2 (two) times daily with a meal. 60 tablet 0  . buPROPion (WELLBUTRIN XL) 300 MG 24 hr tablet Take 1 tablet (300 mg total) by mouth every morning. 90 tablet 1  . butalbital-acetaminophen-caffeine (FIORICET, ESGIC) 50-325-40 MG per tablet Take 1 to 2 tabs 3 to 4 x day if needed for severe Headache  (Maximum 8 tabs/24 hours) 30 tablet 0  . citalopram (CELEXA) 40 MG tablet Take 1 tablet (40 mg total) by mouth daily. 90 tablet 1  . diazepam (VALIUM) 5 MG tablet 1/2-1 po BID for sleep/anxiety 60 tablet 0  .  estradiol (ESTRACE) 2 MG tablet Take 2 mg by mouth daily. 1/2 to 1 tablet daily    . Lubiprostone (AMITIZA PO) Take by mouth.    . meloxicam (MOBIC) 15 MG tablet Take one pill daily with food as needed for pain 30 tablet 3   No current facility-administered medications on file prior to visit.    Health Maintenance:   Immunization History  Administered Date(s) Administered  . Influenza Split 12/25/2012  . Tdap 09/04/2012   Tetanus: 08/2012 Pneumovax: N/A Prevnar 13: N/A Flu vaccine: 2015 Zostavax: N/A Pap: 08/2013 Dr. Corinna Capra MGM: 07/2012 normal will get with Dr. Corinna Capra DEXA: 2015 osteopenia will get with Dr. Corinna Capra in 2017 Colonoscopy: Aug 2014 [facility]. [facility] due 5 years EGD: N/A CT AB/Pelvis 2001 CXR 08/2010 Menses: TAH  Allergies:  Allergies  Allergen Reactions  . Brintellix [Vortioxetine] Nausea Only   Medical History:  Past Medical History  Diagnosis Date  . Allergy   . Anxiety   . Depression   . History of bladder surgery    Surgical History:  Past Surgical History  Procedure Laterality Date  . Abdominal hysterectomy    . Cesarean section    . Bladder suspension     Family History:  Family History  Problem Relation Age of Onset  . Hypertension Mother   . Hypertension Father   . Heart disease Father   . Hyperlipidemia Father    Social History:  History  Substance Use Topics  . Smoking status: Never Smoker   . Smokeless tobacco: Never Used  . Alcohol Use: Yes     Comment: wine occ   Review of Systems  Constitutional: Negative.   HENT: Negative.   Eyes: Negative.   Respiratory: Negative.   Cardiovascular: Negative.   Gastrointestinal: Negative.   Genitourinary: Negative.   Musculoskeletal: Negative.   Skin: Negative.   Neurological: Negative.   Endo/Heme/Allergies: Negative.   Psychiatric/Behavioral: Negative for depression, suicidal ideas, hallucinations, memory loss and substance abuse. The patient is nervous/anxious and has insomnia.     Physical Exam: Estimated body mass index is 20.59 kg/(m^2) as calculated from the following:   Height as of this encounter: 5\' 4"  (1.626 m).   Weight as of this encounter: 120 lb (54.432 kg). BP 120/78 mmHg  Pulse 74  Temp(Src) 97.9 F (36.6 C)  Resp 16  Ht 5\' 4"   (1.626 m)  Wt 120 lb (54.432 kg)  BMI 20.59 kg/m2 General Appearance: Well nourished, in no apparent distress. Eyes: PERRLA, EOMs, conjunctiva no swelling or erythema, normal fundi and vessels. Sinuses: No Frontal/maxillary tenderness ENT/Mouth: Ext aud canals clear, normal light reflex with TMs without erythema, bulging.  Good dentition. No erythema, swelling, or exudate on post pharynx. Tonsils not swollen or erythematous. Hearing normal.  Neck: Supple, thyroid normal. No bruits Respiratory: Respiratory effort normal, BS equal bilaterally without rales, rhonchi, wheezing or stridor. Cardio: RRR without murmurs, rubs or gallops. Brisk peripheral pulses without edema.  Chest: symmetric, with normal excursions and percussion. Breasts: defer Abdomen: Soft, +BS. Non tender, no guarding, rebound, hernias, masses, or organomegaly. .  Lymphatics: Non tender without lymphadenopathy.  Genitourinary: defer Musculoskeletal: Full ROM all peripheral extremities,5/5 strength, and normal gait. Skin: Warm, dry without rashes, lesions, ecchymosis.  Neuro: Cranial nerves intact, reflexes equal bilaterally. Normal muscle tone, no cerebellar symptoms. Sensation intact.  Psych: Awake and oriented X 3, normal affect, Insight and Judgment appropriate.   EKG: defer  Vicie Mutters 9:13 AM

## 2014-09-12 NOTE — Patient Instructions (Addendum)
Add ENTERIC COATED low dose 81 mg Aspirin daily OR can do every other day if you have easy bruising to protect your heart and head. As well as to reduce risk of Colon Cancer by 20 %, Skin Cancer by 26 % , Melanoma by 46% and Pancreatic cancer by 60%  Preventive Care for Adults A healthy lifestyle and preventive care can promote health and wellness. Preventive health guidelines for women include the following key practices.  A routine yearly physical is a good way to check with your health care provider about your health and preventive screening. It is a chance to share any concerns and updates on your health and to receive a thorough exam.  Visit your dentist for a routine exam and preventive care every 6 months. Brush your teeth twice a day and floss once a day. Good oral hygiene prevents tooth decay and gum disease.  The frequency of eye exams is based on your age, health, family medical history, use of contact lenses, and other factors. Follow your health care provider's recommendations for frequency of eye exams.  Eat a healthy diet. Foods like vegetables, fruits, whole grains, low-fat dairy products, and lean protein foods contain the nutrients you need without too many calories. Decrease your intake of foods high in solid fats, added sugars, and salt. Eat the right amount of calories for you.Get information about a proper diet from your health care provider, if necessary.  Regular physical exercise is one of the most important things you can do for your health. Most adults should get at least 150 minutes of moderate-intensity exercise (any activity that increases your heart rate and causes you to sweat) each week. In addition, most adults need muscle-strengthening exercises on 2 or more days a week.  Maintain a healthy weight. The body mass index (BMI) is a screening tool to identify possible weight problems. It provides an estimate of body fat based on height and weight. Your health care  provider can find your BMI and can help you achieve or maintain a healthy weight.For adults 20 years and older:  A BMI below 18.5 is considered underweight.  A BMI of 18.5 to 24.9 is normal.  A BMI of 25 to 29.9 is considered overweight.  A BMI of 30 and above is considered obese.  Maintain normal blood lipids and cholesterol levels by exercising and minimizing your intake of saturated fat. Eat a balanced diet with plenty of fruit and vegetables. If your lipid or cholesterol levels are high, you are over 50, or you are at high risk for heart disease, you may need your cholesterol levels checked more frequently.Ongoing high lipid and cholesterol levels should be treated with medicines if diet and exercise are not working.  If you smoke, find out from your health care provider how to quit. If you do not use tobacco, do not start.  Lung cancer screening is recommended for adults aged 27-80 years who are at high risk for developing lung cancer because of a history of smoking. A yearly low-dose CT scan of the lungs is recommended for people who have at least a 30-pack-year history of smoking and are a current smoker or have quit within the past 15 years. A pack year of smoking is smoking an average of 1 pack of cigarettes a day for 1 year (for example: 1 pack a day for 30 years or 2 packs a day for 15 years). Yearly screening should continue until the smoker has stopped smoking for at least  15 years. Yearly screening should be stopped for people who develop a health problem that would prevent them from having lung cancer treatment.  Avoid use of street drugs. Do not share needles with anyone. Ask for help if you need support or instructions about stopping the use of drugs.  High blood pressure causes heart disease and increases the risk of stroke.  Ongoing high blood pressure should be treated with medicines if weight loss and exercise do not work.  If you are 33-9 years old, ask your health care  provider if you should take aspirin to prevent strokes.  Diabetes screening involves taking a blood sample to check your fasting blood sugar level. This should be done once every 3 years, after age 18, if you are within normal weight and without risk factors for diabetes. Testing should be considered at a younger age or be carried out more frequently if you are overweight and have at least 1 risk factor for diabetes.  Breast cancer screening is essential preventive care for women. You should practice "breast self-awareness." This means understanding the normal appearance and feel of your breasts and may include breast self-examination. Any changes detected, no matter how small, should be reported to a health care provider. Women in their 86s and 30s should have a clinical breast exam (CBE) by a health care provider as part of a regular health exam every 1 to 3 years. After age 60, women should have a CBE every year. Starting at age 34, women should consider having a mammogram (breast X-ray test) every year. Women who have a family history of breast cancer should talk to their health care provider about genetic screening. Women at a high risk of breast cancer should talk to their health care providers about having an MRI and a mammogram every year.  Breast cancer gene (BRCA)-related cancer risk assessment is recommended for women who have family members with BRCA-related cancers. BRCA-related cancers include breast, ovarian, tubal, and peritoneal cancers. Having family members with these cancers may be associated with an increased risk for harmful changes (mutations) in the breast cancer genes BRCA1 and BRCA2. Results of the assessment will determine the need for genetic counseling and BRCA1 and BRCA2 testing.  Routine pelvic exams to screen for cancer are no longer recommended for nonpregnant women who are considered low risk for cancer of the pelvic organs (ovaries, uterus, and vagina) and who do not have  symptoms. Ask your health care provider if a screening pelvic exam is right for you.  If you have had past treatment for cervical cancer or a condition that could lead to cancer, you need Pap tests and screening for cancer for at least 20 years after your treatment. If Pap tests have been discontinued, your risk factors (such as having a new sexual partner) need to be reassessed to determine if screening should be resumed. Some women have medical problems that increase the chance of getting cervical cancer. In these cases, your health care provider may recommend more frequent screening and Pap tests.    Colorectal cancer can be detected and often prevented. Most routine colorectal cancer screening begins at the age of 30 years and continues through age 59 years. However, your health care provider may recommend screening at an earlier age if you have risk factors for colon cancer. On a yearly basis, your health care provider may provide home test kits to check for hidden blood in the stool. Use of a small camera at the end of a  tube, to directly examine the colon (sigmoidoscopy or colonoscopy), can detect the earliest forms of colorectal cancer. Talk to your health care provider about this at age 39, when routine screening begins. Direct exam of the colon should be repeated every 5-10 years through age 56 years, unless early forms of pre-cancerous polyps or small growths are found.  Osteoporosis is a disease in which the bones lose minerals and strength with aging. This can result in serious bone fractures or breaks. The risk of osteoporosis can be identified using a bone density scan. Women ages 61 years and over and women at risk for fractures or osteoporosis should discuss screening with their health care providers. Ask your health care provider whether you should take a calcium supplement or vitamin D to reduce the rate of osteoporosis.  Menopause can be associated with physical symptoms and risks.  Hormone replacement therapy is available to decrease symptoms and risks. You should talk to your health care provider about whether hormone replacement therapy is right for you.  Use sunscreen. Apply sunscreen liberally and repeatedly throughout the day. You should seek shade when your shadow is shorter than you. Protect yourself by wearing long sleeves, pants, a wide-brimmed hat, and sunglasses year round, whenever you are outdoors.  Once a month, do a whole body skin exam, using a mirror to look at the skin on your back. Tell your health care provider of new moles, moles that have irregular borders, moles that are larger than a pencil eraser, or moles that have changed in shape or color.  Stay current with required vaccines (immunizations).  Influenza vaccine. All adults should be immunized every year.  Tetanus, diphtheria, and acellular pertussis (Td, Tdap) vaccine. Pregnant women should receive 1 dose of Tdap vaccine during each pregnancy. The dose should be obtained regardless of the length of time since the last dose. Immunization is preferred during the 27th-36th week of gestation. An adult who has not previously received Tdap or who does not know her vaccine status should receive 1 dose of Tdap. This initial dose should be followed by tetanus and diphtheria toxoids (Td) booster doses every 10 years. Adults with an unknown or incomplete history of completing a 3-dose immunization series with Td-containing vaccines should begin or complete a primary immunization series including a Tdap dose. Adults should receive a Td booster every 10 years.    Zoster vaccine. One dose is recommended for adults aged 80 years or older unless certain conditions are present.    Pneumococcal 13-valent conjugate (PCV13) vaccine. When indicated, a person who is uncertain of her immunization history and has no record of immunization should receive the PCV13 vaccine. An adult aged 51 years or older who has certain  medical conditions and has not been previously immunized should receive 1 dose of PCV13 vaccine. This PCV13 should be followed with a dose of pneumococcal polysaccharide (PPSV23) vaccine. The PPSV23 vaccine dose should be obtained at least 8 weeks after the dose of PCV13 vaccine. An adult aged 80 years or older who has certain medical conditions and previously received 1 or more doses of PPSV23 vaccine should receive 1 dose of PCV13. The PCV13 vaccine dose should be obtained 1 or more years after the last PPSV23 vaccine dose.    Pneumococcal polysaccharide (PPSV23) vaccine. When PCV13 is also indicated, PCV13 should be obtained first. All adults aged 33 years and older should be immunized. An adult younger than age 45 years who has certain medical conditions should be immunized. Any person  who resides in a nursing home or long-term care facility should be immunized. An adult smoker should be immunized. People with an immunocompromised condition and certain other conditions should receive both PCV13 and PPSV23 vaccines. People with human immunodeficiency virus (HIV) infection should be immunized as soon as possible after diagnosis. Immunization during chemotherapy or radiation therapy should be avoided. Routine use of PPSV23 vaccine is not recommended for American Indians, [place] Natives, or people younger than 65 years unless there are medical conditions that require PPSV23 vaccine. When indicated, people who have unknown immunization and have no record of immunization should receive PPSV23 vaccine. One-time revaccination 5 years after the first dose of PPSV23 is recommended for people aged 19-64 years who have chronic kidney failure, nephrotic syndrome, asplenia, or immunocompromised conditions. People who received 1-2 doses of PPSV23 before age 4 years should receive another dose of PPSV23 vaccine at age 50 years or later if at least 5 years have passed since the previous dose. Doses of PPSV23 are not needed  for people immunized with PPSV23 at or after age 18 years.   Preventive Services / Frequency  Ages 19 years and over  Blood pressure check.  Lipid and cholesterol check.  Lung cancer screening. / Every year if you are aged 109-80 years and have a 30-pack-year history of smoking and currently smoke or have quit within the past 15 years. Yearly screening is stopped once you have quit smoking for at least 15 years or develop a health problem that would prevent you from having lung cancer treatment.  Clinical breast exam.** / Every year after age 74 years.  BRCA-related cancer risk assessment.** / For women who have family members with a BRCA-related cancer (breast, ovarian, tubal, or peritoneal cancers).  Mammogram.** / Every year beginning at age 82 years and continuing for as long as you are in good health. Consult with your health care provider.  Pap test.** / Every 3 years starting at age 70 years through age 27 or 8 years with 3 consecutive normal Pap tests. Testing can be stopped between 65 and 70 years with 3 consecutive normal Pap tests and no abnormal Pap or HPV tests in the past 10 years.  Fecal occult blood test (FOBT) of stool. / Every year beginning at age 30 years and continuing until age 51 years. You may not need to do this test if you get a colonoscopy every 10 years.  Flexible sigmoidoscopy or colonoscopy.** / Every 5 years for a flexible sigmoidoscopy or every 10 years for a colonoscopy beginning at age 5 years and continuing until age 36 years.  Hepatitis C blood test.** / For all people born from [birth year] through 1963 and any individual with known risks for hepatitis C.  Osteoporosis screening.** / A one-time screening for women ages 48 years and over and women at risk for fractures or osteoporosis.  Skin self-exam. / Monthly.  Influenza vaccine. / Every year.  Tetanus, diphtheria, and acellular pertussis (Tdap/Td) vaccine.** / 1 dose of Td every 10 years.  Zoster  vaccine.** / 1 dose for adults aged 72 years or older.  Pneumococcal 13-valent conjugate (PCV13) vaccine.** / Consult your health care provider.  Pneumococcal polysaccharide (PPSV23) vaccine.** / 1 dose for all adults aged 31 years and older. Screening for abdominal aortic aneurysm (AAA)  by ultrasound is recommended for people who have history of high blood pressure or who are current or former smokers.

## 2014-09-13 LAB — MICROALBUMIN / CREATININE URINE RATIO
CREATININE, URINE: 71.9 mg/dL
MICROALB UR: 1 mg/dL (ref <2.0)
Microalb Creat Ratio: 13.9 mg/g (ref 0.0–30.0)

## 2014-09-13 LAB — URINALYSIS, ROUTINE W REFLEX MICROSCOPIC
Bilirubin Urine: NEGATIVE
Glucose, UA: NEGATIVE mg/dL
HGB URINE DIPSTICK: NEGATIVE
KETONES UR: NEGATIVE mg/dL
Leukocytes, UA: NEGATIVE
Nitrite: NEGATIVE
Protein, ur: NEGATIVE mg/dL
Specific Gravity, Urine: 1.013 (ref 1.005–1.030)
Urobilinogen, UA: 1 mg/dL (ref 0.0–1.0)
pH: 6.5 (ref 5.0–8.0)

## 2014-09-13 LAB — VITAMIN D 25 HYDROXY (VIT D DEFICIENCY, FRACTURES): VIT D 25 HYDROXY: 37 ng/mL (ref 30–100)

## 2014-12-05 ENCOUNTER — Other Ambulatory Visit: Payer: Self-pay | Admitting: Physician Assistant

## 2014-12-27 ENCOUNTER — Other Ambulatory Visit: Payer: Self-pay | Admitting: Physician Assistant

## 2014-12-27 MED ORDER — AMPHETAMINE-DEXTROAMPHETAMINE 20 MG PO TABS: 10.0000 mg | ORAL_TABLET | Freq: Two times a day (BID) | ORAL | Status: DC

## 2015-03-02 ENCOUNTER — Other Ambulatory Visit: Payer: Self-pay | Admitting: Physician Assistant

## 2015-03-14 ENCOUNTER — Ambulatory Visit (INDEPENDENT_AMBULATORY_CARE_PROVIDER_SITE_OTHER): Payer: BLUE CROSS/BLUE SHIELD | Admitting: Physician Assistant

## 2015-03-14 ENCOUNTER — Encounter: Payer: Self-pay | Admitting: Physician Assistant

## 2015-03-14 VITALS — BP 100/60 | HR 92 | Temp 97.7°F | Resp 16 | Ht 64.0 in | Wt 117.0 lb

## 2015-03-14 DIAGNOSIS — Z79899 Other long term (current) drug therapy: Secondary | ICD-10-CM

## 2015-03-14 DIAGNOSIS — F329 Major depressive disorder, single episode, unspecified: Secondary | ICD-10-CM | POA: Diagnosis not present

## 2015-03-14 DIAGNOSIS — K2270 Barrett's esophagus without dysplasia: Secondary | ICD-10-CM | POA: Insufficient documentation

## 2015-03-14 DIAGNOSIS — K2100 Gastro-esophageal reflux disease with esophagitis, without bleeding: Secondary | ICD-10-CM

## 2015-03-14 DIAGNOSIS — K259 Gastric ulcer, unspecified as acute or chronic, without hemorrhage or perforation: Secondary | ICD-10-CM | POA: Diagnosis not present

## 2015-03-14 DIAGNOSIS — R519 Headache, unspecified: Secondary | ICD-10-CM

## 2015-03-14 DIAGNOSIS — E559 Vitamin D deficiency, unspecified: Secondary | ICD-10-CM | POA: Diagnosis not present

## 2015-03-14 DIAGNOSIS — E785 Hyperlipidemia, unspecified: Secondary | ICD-10-CM | POA: Diagnosis not present

## 2015-03-14 DIAGNOSIS — K22719 Barrett's esophagus with dysplasia, unspecified: Secondary | ICD-10-CM | POA: Diagnosis not present

## 2015-03-14 DIAGNOSIS — R51 Headache: Secondary | ICD-10-CM | POA: Diagnosis not present

## 2015-03-14 DIAGNOSIS — F32A Depression, unspecified: Secondary | ICD-10-CM

## 2015-03-14 DIAGNOSIS — R350 Frequency of micturition: Secondary | ICD-10-CM | POA: Diagnosis not present

## 2015-03-14 LAB — HEPATIC FUNCTION PANEL
ALBUMIN: 4.3 g/dL (ref 3.6–5.1)
ALK PHOS: 52 U/L (ref 33–130)
ALT: 13 U/L (ref 6–29)
AST: 16 U/L (ref 10–35)
BILIRUBIN DIRECT: 0.1 mg/dL (ref <=0.2)
BILIRUBIN TOTAL: 0.6 mg/dL (ref 0.2–1.2)
Indirect Bilirubin: 0.5 mg/dL (ref 0.2–1.2)
Total Protein: 6.8 g/dL (ref 6.1–8.1)

## 2015-03-14 LAB — CBC WITH DIFFERENTIAL/PLATELET
BASOS PCT: 1 % (ref 0–1)
Basophils Absolute: 0.1 10*3/uL (ref 0.0–0.1)
Eosinophils Absolute: 0.1 10*3/uL (ref 0.0–0.7)
Eosinophils Relative: 2 % (ref 0–5)
HCT: 37.8 % (ref 36.0–46.0)
Hemoglobin: 12.9 g/dL (ref 12.0–15.0)
Lymphocytes Relative: 37 % (ref 12–46)
Lymphs Abs: 2 10*3/uL (ref 0.7–4.0)
MCH: 32.7 pg (ref 26.0–34.0)
MCHC: 34.1 g/dL (ref 30.0–36.0)
MCV: 95.9 fL (ref 78.0–100.0)
MONO ABS: 0.6 10*3/uL (ref 0.1–1.0)
MONOS PCT: 11 % (ref 3–12)
MPV: 9.2 fL (ref 8.6–12.4)
Neutro Abs: 2.6 10*3/uL (ref 1.7–7.7)
Neutrophils Relative %: 49 % (ref 43–77)
PLATELETS: 271 10*3/uL (ref 150–400)
RBC: 3.94 MIL/uL (ref 3.87–5.11)
RDW: 13 % (ref 11.5–15.5)
WBC: 5.3 10*3/uL (ref 4.0–10.5)

## 2015-03-14 LAB — BASIC METABOLIC PANEL WITH GFR
BUN: 12 mg/dL (ref 7–25)
CALCIUM: 9.1 mg/dL (ref 8.6–10.4)
CO2: 28 mmol/L (ref 20–31)
CREATININE: 0.72 mg/dL (ref 0.50–1.05)
Chloride: 101 mmol/L (ref 98–110)
GFR, Est African American: >89 mL/min (ref >=60)
GFR, Est Non African American: >89 mL/min (ref >=60)
Glucose, Bld: 81 mg/dL (ref 65–99)
Potassium: 3.9 mmol/L (ref 3.5–5.3)
SODIUM: 138 mmol/L (ref 135–146)

## 2015-03-14 LAB — MAGNESIUM: Magnesium: 1.8 mg/dL (ref 1.5–2.5)

## 2015-03-14 LAB — TSH: TSH: 1.385 u[IU]/mL (ref 0.350–4.500)

## 2015-03-14 MED ORDER — DIAZEPAM 5 MG PO TABS: ORAL_TABLET | ORAL | Status: DC

## 2015-03-14 MED ORDER — AMPHETAMINE-DEXTROAMPHETAMINE 20 MG PO TABS: 10.0000 mg | ORAL_TABLET | Freq: Two times a day (BID) | ORAL | Status: DC

## 2015-03-14 NOTE — Progress Notes (Signed)
Assessment and Plan:  1. Hyperlipidemia LDL goal <100 -continue medications, check lipids, decrease fatty foods, increase activity.  - CBC with Differential/Platelet - BASIC METABOLIC PANEL WITH GFR - Hepatic function panel - TSH  2. Depression - TSH  3. Gastroesophageal reflux disease with esophagitis Continue PPI/H2 blocker, diet discussed  4. Nonintractable headache, unspecified chronicity pattern, unspecified headache type Better, avoid triggers  5. Vitamin D deficiency - VITAMIN D 25 Hydroxy (Vit-D Deficiency, Fractures)  6. Medication management - Magnesium  7. Gastric ulcer, unspecified chronicity with barret's esophagus Continue dexilant, repeat EGD 3 years  8. Urinary frequency - Urinalysis, Routine w reflex microscopic (not at [facility]) - Urine culture     Continue diet and meds as discussed. Further disposition pending results of labs. Over 30 minutes of exam, counseling, chart review, and critical decision making was performed Future Appointments Date Time Provider [place]  09/12/2015 9:00 AM Vicie Mutters, PA-C [id number] None    HPI 54 y.o. female  presents for 6 month follow up on GERD, depression, HA, hyperlipidemia and vitamin D def.   Her blood pressure has been controlled at home, today their BP is BP: 100/60 mmHg  She does workout. She denies chest pain, shortness of breath, dizziness. She is on estrogen for menopausal symptoms, she is on bASA.  She has had some urinary frequency.   She is not on cholesterol medication and denies myalgias. Her cholesterol is at goal. The cholesterol last visit was:   Lab Results  Component Value Date   CHOL 195 09/12/2014   HDL 89 09/12/2014   LDLCALC 93 09/12/2014   TRIG 66 09/12/2014   CHOLHDL 2.2 09/12/2014   Last A1C in the office was:  Lab Results  Component Value Date   HGBA1C 5.0 09/08/2013   She has depression/anxiety, on prozac wellbutrin, and adderall and xanax PRN. States better  controlled, holidays was hard with her grandma passing sept last year, and has been busy at work. She follows with [facility] for GERD, had another endo and colonoscopy, had another ulcer and barret's esphoagus. Will do repeat colon in 5 years and EGD in 3 years.  Needs to go see Dr. Corinna Capra first of year for [facility] and pelvic exam.  Patient is on Vitamin D supplement.   Lab Results  Component Value Date   VD25OH 37 09/12/2014      Current Medications:  Current Outpatient Prescriptions on File Prior to Visit  Medication Sig Dispense Refill  . albuterol (PROVENTIL HFA;VENTOLIN HFA) 108 (90 BASE) MCG/ACT inhaler Inhale 2 puffs into the lungs every 6 (six) hours as needed for wheezing or shortness of breath. 1 Inhaler 2  . ALPRAZolam (XANAX) 0.5 MG tablet Take 1 tablet (0.5 mg total) by mouth 3 (three) times daily as needed for sleep or anxiety. 30 tablet 0  . amphetamine-dextroamphetamine (ADDERALL) 20 MG tablet Take 0.5 tablets (10 mg total) by mouth 2 (two) times daily with a meal. 60 tablet 0  . buPROPion (WELLBUTRIN XL) 300 MG 24 hr tablet take 1 tablet by mouth every morning 90 tablet 1  . butalbital-acetaminophen-caffeine (FIORICET, ESGIC) 50-325-40 MG tablet take 1 tablet by mouth every 1 to 2 hours if needed for headache 30 tablet 0  . diazepam (VALIUM) 5 MG tablet 1/2-1 po BID for sleep/anxiety 60 tablet 2  . estradiol (ESTRACE) 2 MG tablet Take 1 tablet (2 mg total) by mouth daily. 1/2 to 1 tablet daily 30 tablet 6  . FLUoxetine (PROZAC) 40 MG capsule  Take 1 capsule (40 mg total) by mouth daily. 30 capsule 6  . Lubiprostone (AMITIZA PO) Take by mouth.    . meloxicam (MOBIC) 15 MG tablet Take one pill daily with food as needed for pain 30 tablet 3   No current facility-administered medications on file prior to visit.   Medical History:  Past Medical History  Diagnosis Date  . Allergy   . Anxiety   . Depression   . History of bladder surgery    Allergies:  Allergies  Allergen  Reactions  . Brintellix [Vortioxetine] Nausea Only     Review of Systems:  Review of Systems  Constitutional: Negative.   HENT: Negative.   Eyes: Negative.   Respiratory: Negative.   Cardiovascular: Negative.   Gastrointestinal: Negative.   Genitourinary: Positive for frequency. Negative for dysuria, urgency, hematuria and flank pain.  Musculoskeletal: Negative.   Skin: Negative.   Neurological: Negative.   Endo/Heme/Allergies: Negative.   Psychiatric/Behavioral: Negative for depression, suicidal ideas, hallucinations, memory loss and substance abuse. The patient is nervous/anxious and has insomnia.     Family history- Review and unchanged Social history- Review and unchanged Physical Exam: BP 100/60 mmHg  Pulse 92  Temp(Src) 97.7 F (36.5 C) (Temporal)  Resp 16  Ht 5\' 4"  (1.626 m)  Wt 117 lb (53.071 kg)  BMI 20.07 kg/m2  SpO2 96% Wt Readings from Last 3 Encounters:  03/14/15 117 lb (53.071 kg)  09/12/14 120 lb (54.432 kg)  06/28/14 125 lb (56.7 kg)   General Appearance: Well nourished, in no apparent distress. Eyes: PERRLA, EOMs, conjunctiva no swelling or erythema Sinuses: No Frontal/maxillary tenderness ENT/Mouth: Ext aud canals clear, TMs without erythema, bulging. No erythema, swelling, or exudate on post pharynx.  Tonsils not swollen or erythematous. Hearing normal.  Neck: Supple, thyroid normal.  Respiratory: Respiratory effort normal, BS equal bilaterally without rales, rhonchi, wheezing or stridor.  Cardio: RRR with no MRGs. Brisk peripheral pulses without edema.  Abdomen: Soft, + BS,  Non tender, no guarding, rebound, hernias, masses. Lymphatics: Non tender without lymphadenopathy.  Musculoskeletal: Full ROM, 5/5 strength, Normal gait Skin: Warm, dry without rashes, lesions, ecchymosis.  Neuro: Cranial nerves intact. Normal muscle tone, no cerebellar symptoms. Psych: Awake and oriented X 3, normal affect, Insight and Judgment appropriate.    Vicie Mutters, PA-C 8:59 AM [facility]

## 2015-03-15 LAB — URINALYSIS, ROUTINE W REFLEX MICROSCOPIC

## 2015-03-15 LAB — VITAMIN D 25 HYDROXY (VIT D DEFICIENCY, FRACTURES): Vit D, 25-Hydroxy: 30 ng/mL (ref 30–100)

## 2015-03-16 ENCOUNTER — Other Ambulatory Visit: Payer: Self-pay | Admitting: Physician Assistant

## 2015-03-16 DIAGNOSIS — R300 Dysuria: Secondary | ICD-10-CM

## 2015-03-17 LAB — URINALYSIS, ROUTINE W REFLEX MICROSCOPIC
Bilirubin Urine: NEGATIVE
GLUCOSE, UA: NEGATIVE
HGB URINE DIPSTICK: NEGATIVE
Nitrite: POSITIVE — AB
PH: 7 (ref 5.0–8.0)
Protein, ur: NEGATIVE
Specific Gravity, Urine: 1.022 (ref 1.001–1.035)

## 2015-03-17 LAB — URINALYSIS, MICROSCOPIC ONLY
Bacteria, UA: NONE SEEN [HPF]
CASTS: NONE SEEN [LPF]
Crystals: NONE SEEN [HPF]
RBC / HPF: NONE SEEN RBC/HPF (ref <=2)
SQUAMOUS EPITHELIAL / LPF: NONE SEEN [HPF] (ref <=5)
WBC, UA: NONE SEEN WBC/HPF (ref <=5)
Yeast: NONE SEEN [HPF]

## 2015-03-17 LAB — URINE CULTURE
Colony Count: NO GROWTH
Organism ID, Bacteria: NO GROWTH

## 2015-03-31 MED ORDER — SULFAMETHOXAZOLE-TRIMETHOPRIM 800-160 MG PO TABS: 1.0000 | ORAL_TABLET | Freq: Two times a day (BID) | ORAL | Status: DC

## 2015-03-31 NOTE — Addendum Note (Signed)
Addended by: Vicie Mutters R on: 03/31/2015 11:44 AM   Modules accepted: Orders

## 2015-04-27 ENCOUNTER — Other Ambulatory Visit: Payer: Self-pay | Admitting: Physician Assistant

## 2015-04-27 ENCOUNTER — Other Ambulatory Visit: Payer: Self-pay | Admitting: Internal Medicine

## 2015-04-27 MED ORDER — BUTALBITAL-APAP-CAFFEINE 50-325-40 MG PO TABS: ORAL_TABLET | ORAL | Status: DC

## 2015-04-27 MED ORDER — ALPRAZOLAM 0.5 MG PO TABS: 0.5000 mg | ORAL_TABLET | Freq: Three times a day (TID) | ORAL | Status: DC | PRN

## 2015-05-22 ENCOUNTER — Telehealth: Payer: Self-pay | Admitting: Pulmonary Disease

## 2015-05-22 ENCOUNTER — Other Ambulatory Visit (INDEPENDENT_AMBULATORY_CARE_PROVIDER_SITE_OTHER): Payer: BLUE CROSS/BLUE SHIELD

## 2015-05-22 ENCOUNTER — Ambulatory Visit (HOSPITAL_COMMUNITY)
Admission: RE | Admit: 2015-05-22 | Discharge: 2015-05-22 | Disposition: A | Discharge status: Home / Self Care | Payer: BLUE CROSS/BLUE SHIELD | Source: Ambulatory Visit | Attending: Pulmonary Disease | Admitting: Pulmonary Disease

## 2015-05-22 ENCOUNTER — Ambulatory Visit (INDEPENDENT_AMBULATORY_CARE_PROVIDER_SITE_OTHER): Payer: BLUE CROSS/BLUE SHIELD | Admitting: Pulmonary Disease

## 2015-05-22 ENCOUNTER — Encounter: Payer: Self-pay | Admitting: Pulmonary Disease

## 2015-05-22 ENCOUNTER — Encounter (HOSPITAL_COMMUNITY): Payer: Self-pay

## 2015-05-22 VITALS — BP 118/78 | HR 84 | Ht 64.0 in | Wt 125.0 lb

## 2015-05-22 DIAGNOSIS — R0600 Dyspnea, unspecified: Secondary | ICD-10-CM

## 2015-05-22 DIAGNOSIS — R0789 Other chest pain: Secondary | ICD-10-CM | POA: Diagnosis not present

## 2015-05-22 DIAGNOSIS — R079 Chest pain, unspecified: Secondary | ICD-10-CM | POA: Insufficient documentation

## 2015-05-22 LAB — POCT I-STAT, CHEM 8
BUN: 10 mg/dL (ref 6–20)
CALCIUM ION: 1.19 mmol/L (ref 1.12–1.23)
CHLORIDE: 101 mmol/L (ref 101–111)
Creatinine, Ser: 0.6 mg/dL (ref 0.44–1.00)
Glucose, Bld: 83 mg/dL (ref 65–99)
HEMATOCRIT: 40 % (ref 36.0–46.0)
Hemoglobin: 13.6 g/dL (ref 12.0–15.0)
Potassium: 3.6 mmol/L (ref 3.5–5.1)
SODIUM: 140 mmol/L (ref 135–145)
TCO2: 27 mmol/L (ref 0–100)

## 2015-05-22 LAB — TROPONIN I: TNIDX: 0 ug/l (ref 0.00–0.06)

## 2015-05-22 MED ORDER — IOHEXOL 350 MG/ML SOLN
100.0000 mL | Freq: Once | INTRAVENOUS | Status: AC | PRN
  Administered 2015-05-22: 100 mL via INTRAVENOUS

## 2015-05-22 NOTE — Telephone Encounter (Signed)
IMAGING LLE Venous Duplex (01/02/12):  No evidence of DVT.  LAB 03/14/15 CBC: 5.3/12.9/37.8/271 BMP: 138/3.9/101/28/12/0.72/81/9.1 LFT: 4.3/6.8/0.6/52/16/13 Magnesium: 1.8 TSH: 1.385

## 2015-05-22 NOTE — Telephone Encounter (Signed)
Outpatient Chest CTA negative for PE. Patient sent home. Follow up in AM with PCCM office.

## 2015-05-22 NOTE — Progress Notes (Signed)
Subjective:    Patient ID: Haley Sparks, female    DOB: [birth year], 55 y.o.   MRN: [id number]  HPI No history of childhood breathing problems or allergies. She did have infrequent bronchitis. She reports that she developed chest discomfort around the early part of February. She reports the patient gradually worsened and she reports a pain radiating from her mid-sternum to her mid back that is a "grabbing" pain. She reports it has awoken her up in the middle of the night. She reports there is a pleuritic component to the pain as well. She denies any cough. Denies any wheezing. She denies any trauma. She reports that she had minimal physical exertion around the time of onset where she had lifted a couple of buckets but nothing out of her ordinary. She reports she does have dyspnea at times and she feels like she cannot take a "good deep breathing" because of the pain. She reports an intermittent nonproductive cough that is not unusual for her. No fever, chills, or sweats. She reports she did have some near syncope last week but has had no frank syncope. She was seen in Urgent Care and underwent a CXR that showed hyperinflation & prompted her referral to our office. No recent travel or periods of immobility. She has known Barretts Esophagus & last EGD was in December 2016. She had an EKG at urgent care that showed NSR. She reports she has never had problems like this before. Pain is worse in the morning when she first wakes up & also with periods of immobility.   Review of Systems No rashes or bruising. No lower extremity edema or pain. No nausea, emesis, diarrhea, or abdominal pain. A pertinent 14 point review of systems is negative except as per the history of presenting illness.  Allergies  Allergen Reactions  . Brintellix [Vortioxetine] Nausea Only    Current Outpatient Prescriptions on File Prior to Visit  Medication Sig Dispense Refill  . albuterol (PROVENTIL HFA;VENTOLIN HFA) 108 (90 BASE)  MCG/ACT inhaler Inhale 2 puffs into the lungs every 6 (six) hours as needed for wheezing or shortness of breath. 1 Inhaler 2  . ALPRAZolam (XANAX) 0.5 MG tablet Take 1 tablet (0.5 mg total) by mouth 3 (three) times daily as needed for sleep or anxiety. 30 tablet 0  . amphetamine-dextroamphetamine (ADDERALL) 20 MG tablet Take 0.5 tablets (10 mg total) by mouth 2 (two) times daily with a meal. 60 tablet 0  . buPROPion (WELLBUTRIN XL) 300 MG 24 hr tablet take 1 tablet by mouth every morning 90 tablet 1  . butalbital-acetaminophen-caffeine (FIORICET, ESGIC) 50-325-40 MG tablet take 1 tablet by mouth every 1 to 2 hours if needed for headache 30 tablet 0  . Dexlansoprazole (DEXILANT PO) Take 500 mg by mouth.    . diazepam (VALIUM) 5 MG tablet 1/2-1 po BID for sleep/anxiety 60 tablet 2  . estradiol (ESTRACE) 2 MG tablet Take 1 tablet (2 mg total) by mouth daily. 1/2 to 1 tablet daily 30 tablet 6  . FLUoxetine (PROZAC) 40 MG capsule Take 1 capsule (40 mg total) by mouth daily. 30 capsule 6  . Lubiprostone (AMITIZA PO) Take by mouth.    . meloxicam (MOBIC) 15 MG tablet Take one pill daily with food as needed for pain 30 tablet 3   No current facility-administered medications on file prior to visit.    Past Medical History  Diagnosis Date  . Allergy   . Anxiety   . Depression   .  History of bladder surgery     Past Surgical History  Procedure Laterality Date  . Abdominal hysterectomy    . Cesarean section    . Bladder suspension      Family History  Problem Relation Age of Onset  . Hypertension Mother   . Hypertension Father   . Heart disease Father   . Hyperlipidemia Father     Social History   Social History  . Marital Status: Married    Spouse Name: N/A  . Number of Children: N/A  . Years of Education: N/A   Social History Main Topics  . Smoking status: Never Smoker   . Smokeless tobacco: Never Used  . Alcohol Use: Yes     Comment: wine occ  . Drug Use: No  . Sexual  Activity: Not Asked   Other Topics Concern  . None   Social History Narrative      Objective:   Physical Exam BP 118/78 mmHg  Pulse 84  Ht '5\' 4"'  (1.626 m)  Wt 125 lb (56.7 kg)  BMI 21.45 kg/m2  SpO2 97% General:  Awake. Alert. No acute distress. Thin Caucasian female.  Integument:  Warm & dry. No rash on exposed skin. No bruising. Lymphatics:  No appreciated cervical or supraclavicular lymphadenoapthy. HEENT:  Moist mucus membranes. No oral ulcers. No scleral injection or icterus. Cardiovascular:  Regular rate. No edema. No appreciable JVD.  Pulmonary:  Good aeration & clear to auscultation bilaterally. Symmetric chest wall expansion. No accessory muscle use. Abdomen: Soft. Normal bowel sounds. Nondistended. Very minimal right upper quadrant tenderness to deep palpation. Musculoskeletal:  Normal bulk and tone. Hand grip strength 5/5 bilaterally. No joint deformity or effusion appreciated. Neurological:  CN 2-12 grossly in tact. No meningismus. Moving all 4 extremities equally. Symmetric brachioradialis deep tendon reflexes. Psychiatric:  Mood and affect congruent. Speech normal rhythm, rate & tone.  IMAGING CXR PA/LAT 05/17/15 (per radiologist):  Heart normal in size. Hyperinflation without focal infiltrate or effusion. Subjective osteopenia.   EKG 05/21/14 (personally reviewed by me): Normal sinus rhythm. QTC 399 ms. No evidence of ischemia, conduction delay, or right heart strain.  LABS 05/17/15 CBC:  5.7/11.5/37.2/268 BMP:  141/4/99/28/9/0.54/79/9.1 LFT:  4.5/6.7/0.3/55/16/11 BNP:  3 D-Dimer:  0.31 (0 - 0.49)     Assessment & Plan:  55 year old Caucasian female with approximately 3 weeks of progressively worsening chest pain with radiation to her back. There appears to be a pleuritic component as well as ongoing dyspnea. Patient has no trauma or other evidence that would suggest a musculoskeletal etiology to her discomfort. Additionally, I do not believe this is secondary  to reflux but this must still be entertained with her history of Barrett's esophagus. Aortic aneurysm with dissection is less likely given the patient's history and description of symptoms but must be considered. Acute pulmonary embolus is of primary concern with her familial history of DVT/PE and her grandmother as well as a history of miscarriages herself. She has no other autoimmune symptoms that would suggest underlying lupus at this time or physical exam findings that would suggest either. Cardiac ischemia is less likely given the chronicity of the patient's symptoms as well.  1. Atypical chest pain with pleurodynia: Checking serum ESR, CRP, ANA with reflex to comprehensive panel, d-dimer, INR, PTT, troponin I, CK-MB, & BNP. Also checking stat CT chest angiogram with aortogram. 2. Dyspnea: Unclear etiology. Refer to #1. If workup is negative plan for pulmonary function testing after her next appointment if symptoms persist. 3. Follow-up:  Patient to return to clinic in 2-3 weeks pending the results of her CT chest angiogram/aortogram.  Sonia Baller. Ashok Cordia, M.D. [facility] Pager:  [phone] After 3pm or if no response, call [phone] 4:00 PM 05/22/2015

## 2015-05-22 NOTE — Patient Instructions (Addendum)
   We will contact you with the results of your CT angiogram.  I will see you back in office to review the results of your blood work  Please call my office if you have any new problems before your next appointment  TESTS ORDERED: 1. CT angiogram chest with aortogram stat (report to be called to [phone]) 2. Serum labs: ANA with reflex to compensate panel, ESR, CRP, d-dimer, troponin I, CK-MB, BNP your tests, INR, & PTT

## 2015-05-23 ENCOUNTER — Other Ambulatory Visit: Payer: Self-pay | Admitting: Pulmonary Disease

## 2015-05-23 DIAGNOSIS — R0600 Dyspnea, unspecified: Secondary | ICD-10-CM

## 2015-05-23 LAB — CREATININE KINASE MB: CK MB INDEX: 1.7 ng/mL (ref 0.0–5.3)

## 2015-05-23 LAB — BASIC METABOLIC PANEL
BUN/Creatinine Ratio: 16 (ref 9–23)
BUN: 10 mg/dL (ref 6–24)
CO2: 27 mmol/L (ref 18–29)
Calcium: 9.3 mg/dL (ref 8.7–10.2)
Chloride: 98 mmol/L (ref 96–106)
Creatinine, Ser: 0.62 mg/dL (ref 0.57–1.00)
GFR, EST AFRICAN AMERICAN: 120 mL/min/{1.73_m2} (ref >59)
GFR, EST NON AFRICAN AMERICAN: 104 mL/min/{1.73_m2} (ref >59)
Glucose: 81 mg/dL (ref 65–99)
POTASSIUM: 3.8 mmol/L (ref 3.5–5.2)
SODIUM: 140 mmol/L (ref 134–144)

## 2015-05-23 LAB — PROTHROMBIN TIME + INR
INR: 1 (ref 0.8–1.2)
Prothrombin Time: 10 s (ref 9.1–12.0)

## 2015-05-23 LAB — C-REACTIVE PROTEIN: CRP: 2.5 mg/L (ref 0.0–4.9)

## 2015-05-23 LAB — SEDIMENTATION RATE: SED RATE: 7 mm/h (ref 0–40)

## 2015-05-23 LAB — ANA W/REFLEX: Anti Nuclear Antibody(ANA): NEGATIVE

## 2015-05-23 LAB — BRAIN NATRIURETIC PEPTIDE: BNP: 7.6 pg/mL (ref 0.0–100.0)

## 2015-05-23 LAB — D-DIMER, QUANTITATIVE: D-DIMER: <0.2 mg/L FEU (ref 0.00–0.49)

## 2015-05-24 ENCOUNTER — Ambulatory Visit (INDEPENDENT_AMBULATORY_CARE_PROVIDER_SITE_OTHER): Payer: BLUE CROSS/BLUE SHIELD | Admitting: Pulmonary Disease

## 2015-05-24 DIAGNOSIS — R0600 Dyspnea, unspecified: Secondary | ICD-10-CM

## 2015-05-24 LAB — PULMONARY FUNCTION TEST
DL/VA % pred: 84 %
DL/VA: 4.17 ml/min/mmHg/L
DLCO COR % PRED: 91 %
DLCO UNC: 23.6 ml/min/mmHg
DLCO cor: 23.32 ml/min/mmHg
DLCO unc % pred: 92 %
FEF 25-75 POST: 4.09 L/s
FEF 25-75 Pre: 3.5 L/sec
FEF2575-%Change-Post: 16 %
FEF2575-%Pred-Post: 150 %
FEF2575-%Pred-Pre: 129 %
FEV1-%CHANGE-POST: 4 %
FEV1-%PRED-POST: 122 %
FEV1-%Pred-Pre: 117 %
FEV1-POST: 3.48 L
FEV1-PRE: 3.34 L
FEV1FVC-%CHANGE-POST: 3 %
FEV1FVC-%Pred-Pre: 104 %
FEV6-%Change-Post: 0 %
FEV6-%PRED-PRE: 114 %
FEV6-%Pred-Post: 115 %
FEV6-POST: 4.03 L
FEV6-PRE: 4.01 L
FEV6FVC-%PRED-POST: 103 %
FEV6FVC-%PRED-PRE: 103 %
FVC-%CHANGE-POST: 0 %
FVC-%PRED-POST: 112 %
FVC-%PRED-PRE: 111 %
FVC-POST: 4.05 L
FVC-PRE: 4.01 L
POST FEV6/FVC RATIO: 100 %
PRE FEV1/FVC RATIO: 83 %
PRE FEV6/FVC RATIO: 100 %
Post FEV1/FVC ratio: 86 %
RV % PRED: 105 %
RV: 1.99 L
TLC % PRED: 112 %
TLC: 5.87 L

## 2015-05-24 NOTE — Progress Notes (Signed)
PFT 05/24/15: FVC 4.01 L (111%) FEV1 3.34 L (117%) FEV1/FVC 0.83 FEF 25-75 3.50 L (100.9%) no bronchodilator response TLC 5.87 L (112%) RV 105% ERV 116% DLCO corrected 91%  6MWT 05/24/15:  Walked 498 meters / Baseline Sat 98% on RA / Nadir Sat 98% on RA (pt c/o minimal dizziness but no SOB)  IMAGING CTA CHEST 05/22/15 (per radiologist): No pulmonary embolism or aortic dissection. No mass or adenopathy within the neck, axilla, hilum, or mediastinum. Minor scarring at the apices. No pleural effusion or pneumothorax. Low-density liver lesions consistent with hepatic cysts.  LABS 05/22/15 BMP: 140/3.8/98/27/10/0.62/81/9.3 BNP: 7.6 CK-MB: 1.7 Troponin I: 0.00 D-dimer:  <0.2 ESR: 7 CRP: 2.5  INR: 1.0 ANA: Negative  

## 2015-05-24 NOTE — Progress Notes (Signed)
PFT done today. 

## 2015-06-07 ENCOUNTER — Ambulatory Visit (INDEPENDENT_AMBULATORY_CARE_PROVIDER_SITE_OTHER): Payer: BLUE CROSS/BLUE SHIELD | Admitting: Pulmonary Disease

## 2015-06-07 ENCOUNTER — Encounter: Payer: Self-pay | Admitting: Pulmonary Disease

## 2015-06-07 VITALS — BP 110/60 | HR 79 | Ht 64.0 in | Wt 125.2 lb

## 2015-06-07 DIAGNOSIS — R0600 Dyspnea, unspecified: Secondary | ICD-10-CM

## 2015-06-07 DIAGNOSIS — R079 Chest pain, unspecified: Secondary | ICD-10-CM | POA: Diagnosis not present

## 2015-06-07 DIAGNOSIS — K7689 Other specified diseases of liver: Secondary | ICD-10-CM

## 2015-06-07 HISTORY — DX: Other specified diseases of liver: K76.89

## 2015-06-07 NOTE — Progress Notes (Signed)
Subjective:    Patient ID: Haley Sparks, female    DOB: 09/18/1960, 55 y.o.   MRN: [id number]  C.C.:  Follow-up for Pleurodynia & Dyspnea.  HPI Pleurodynia: Autoimmune workup was negative. She reports her pain is decreasing in intensity & frequency. She denies any triggers to the pain.   Dyspnea: Patient had no desaturation on ambulation and had normal pulmonary function testing after last appointment. CTA of the chest was negative for any pulmonary embolism or aortic dissection. She reports mild dyspnea over the weekend going up and down steps. Denies any coughing or wheezing.   Review of Systems No fever, chills, or sweats. No nausea, emesis, or diarrhea.   Allergies  Allergen Reactions  . Brintellix [Vortioxetine] Nausea Only    Current Outpatient Prescriptions on File Prior to Visit  Medication Sig Dispense Refill  . ALPRAZolam (XANAX) 0.5 MG tablet Take 1 tablet (0.5 mg total) by mouth 3 (three) times daily as needed for sleep or anxiety. 30 tablet 0  . amphetamine-dextroamphetamine (ADDERALL) 20 MG tablet Take 0.5 tablets (10 mg total) by mouth 2 (two) times daily with a meal. (Patient taking differently: Take 10 mg by mouth 2 (two) times daily as needed. ) 60 tablet 0  . buPROPion (WELLBUTRIN XL) 300 MG 24 hr tablet take 1 tablet by mouth every morning 90 tablet 1  . butalbital-acetaminophen-caffeine (FIORICET, ESGIC) 50-325-40 MG tablet take 1 tablet by mouth every 1 to 2 hours if needed for headache 30 tablet 0  . cyclobenzaprine (FLEXERIL) 10 MG tablet Take 10 mg by mouth 3 (three) times daily as needed for muscle spasms.    . Dexlansoprazole (DEXILANT PO) Take 500 mg by mouth.    . diazepam (VALIUM) 5 MG tablet 1/2-1 po BID for sleep/anxiety 60 tablet 2  . estradiol (ESTRACE) 2 MG tablet Take 1 tablet (2 mg total) by mouth daily. 1/2 to 1 tablet daily 30 tablet 6  . FLUoxetine (PROZAC) 40 MG capsule Take 1 capsule (40 mg total) by mouth daily. 30 capsule 6  .  Lubiprostone (AMITIZA PO) Take by mouth.    . meloxicam (MOBIC) 15 MG tablet Take one pill daily with food as needed for pain 30 tablet 3  . albuterol (PROVENTIL HFA;VENTOLIN HFA) 108 (90 BASE) MCG/ACT inhaler Inhale 2 puffs into the lungs every 6 (six) hours as needed for wheezing or shortness of breath. (Patient not taking: Reported on 06/07/2015) 1 Inhaler 2   No current facility-administered medications on file prior to visit.    Past Medical History  Diagnosis Date  . Allergy   . Anxiety   . Depression   . History of bladder surgery   . GERD (gastroesophageal reflux disease)   . Barrett's esophagus   . Peptic ulcer disease   . Miscarriage     twice    Past Surgical History  Procedure Laterality Date  . Abdominal hysterectomy    . Cesarean section    . Bladder suspension    . Tonsillectomy      Family History  Problem Relation Age of Onset  . Hypertension Mother   . Hypertension Father   . Heart disease Father   . Hyperlipidemia Father   . COPD Maternal Grandmother   . Pulmonary embolism Maternal Grandmother   . Asthma Paternal Grandmother   . Heart disease Paternal Grandfather     Social History   Social History  . Marital Status: Married    Spouse Name: N/A  . Number of  Children: N/A  . Years of Education: N/A   Social History Main Topics  . Smoking status: Passive Smoke Exposure - Never Smoker  . Smokeless tobacco: Never Used     Comment: Smoke exposure as a child through parents.  . Alcohol Use: 0.0 oz/week    0 Standard drinks or equivalent per week     Comment: wine occ  . Drug Use: No  . Sexual Activity: Not Asked   Other Topics Concern  . None   Social History Narrative   Originally from [place]. Always lived in [place]. Previously has traveled to [facility] (July 2016). No international travel. No recent travel. Currently works as an Medical illustrator. Currently has a dog. No hot tub, bird, or mold exposure. Enjoys gardening in her yard.       Objective:     Physical Exam BP 110/60 mmHg  Pulse 79  Ht _0  (1.626 m)  Wt 125 lb 3.2 oz (56.79 kg)  BMI 21.48 kg/m2  SpO2 100% General:  Awake. Alert. Thin Caucasian female.  Integument:  Warm & dry. No rash on exposed skin. Lymphatics:  No appreciated cervical or supraclavicular lymphadenoapthy. HEENT:  Moist mucus membranes. No oral ulcers. No nasal turbinate swelling. Cardiovascular:  Regular rate. No edema. No appreciable JVD.  Pulmonary:  Clear bilaterally on auscultation. Normal work of breathing on room air. Abdomen: Soft. Normal bowel sounds. Nondistended. Musculoskeletal:  Normal bulk and tone. No joint deformity or effusion appreciated.  PFT 05/24/15: FVC 4.01 L (111%) FEV1 3.34 L (117%) FEV1/FVC 0.83 FEF 25-75 3.50 L (100.9%) no bronchodilator response TLC 5.87 L (112%) RV 105% ERV 116% DLCO corrected 91%  6MWT 05/24/15: Walked 498 meters / Baseline Sat 98% on RA / Nadir Sat 98% on RA (pt c/o minimal dizziness but no SOB)  IMAGING CTA CHEST 05/22/15 (personally reviewed by me with patient today): No pulmonary embolism or aortic dissection. No pathologic mediastinal or hilar adenopathy. Minor scarring at the apices. No pleural effusion or pneumothorax. Low-density liver lesions consistent with hepatic cysts. No parenchymal nodule or opacity appreciated.  CXR PA/LAT 05/17/15 (per radiologist):  Heart normal in size. Hyperinflation without focal infiltrate or effusion. Subjective osteopenia.   EKG 05/21/14 (previously reviewed by me): Normal sinus rhythm. QTC 399 ms. No evidence of ischemia, conduction delay, or right heart strain.  LABS 05/22/15 BMP: 140/3.8/98/27/10/0.62/81/9.3 BNP: 7.6 CK-MB: 1.7 Troponin I: 0.00 D-dimer: <0.2 ESR: 7 CRP: 2.5  INR: 1.0 ANA: Negative  05/17/15 CBC:  5.7/11.5/37.2/268 BMP:  141/4/99/28/9/0.54/79/9.1 LFT:  4.5/6.7/0.3/55/16/11 BNP:  3 D-Dimer:  0.31 (0 - 0.49)     Assessment & Plan:  55 year old Caucasian female with pleurodynia and  dyspnea that seemed to be improving. Suspect this is due to either a transient pleural inflammation or possibly musculoskeletal strain. I personally reviewed her CT angiogram of the chest with her today during her visit. Her serum workup was negative. Additionally, her 6 minute walk test and pulmonary function testing were both normal. At this time as her symptoms are resolving there is no need for further testing.  1. Pleurodynia: Symptoms resolving. Workup negative. No further testing. 2. Dyspnea: Resolving. No further testing. 3. Liver cysts: Results reviewed with patient. Recommended addressing this with her primary care physician. 4. Follow-up: Patient will follow up on an as-needed basis.  Sonia Baller Ashok Cordia, M.D. [facility] Pager:  [phone] After 3pm or if no response, call [phone] 3:58 PM 06/07/2015

## 2015-06-07 NOTE — Patient Instructions (Signed)
   Call me if your breathing worsens or pain recurs.

## 2015-06-17 ENCOUNTER — Encounter: Payer: Self-pay | Admitting: *Deleted

## 2015-06-20 ENCOUNTER — Institutional Professional Consult (permissible substitution): Payer: Self-pay | Admitting: Pulmonary Disease

## 2015-07-13 DIAGNOSIS — L700 Acne vulgaris: Secondary | ICD-10-CM | POA: Diagnosis not present

## 2015-07-19 DIAGNOSIS — Z713 Dietary counseling and surveillance: Secondary | ICD-10-CM | POA: Diagnosis not present

## 2015-07-24 ENCOUNTER — Other Ambulatory Visit: Payer: Self-pay | Admitting: Physician Assistant

## 2015-07-24 MED ORDER — AMPHETAMINE-DEXTROAMPHETAMINE 20 MG PO TABS: 10.0000 mg | ORAL_TABLET | Freq: Two times a day (BID) | ORAL | Status: DC

## 2015-07-24 NOTE — Addendum Note (Signed)
Addended by: Starlyn Skeans A on: 07/24/2015 01:39 PM   Modules accepted: Orders

## 2015-07-26 ENCOUNTER — Other Ambulatory Visit: Payer: Self-pay | Admitting: Internal Medicine

## 2015-07-26 DIAGNOSIS — G44209 Tension-type headache, unspecified, not intractable: Secondary | ICD-10-CM

## 2015-07-26 MED ORDER — BUTALBITAL-APAP-CAFFEINE 50-325-40 MG PO TABS: ORAL_TABLET | ORAL | Status: DC

## 2015-08-01 DIAGNOSIS — Z6821 Body mass index (BMI) 21.0-21.9, adult: Secondary | ICD-10-CM | POA: Diagnosis not present

## 2015-08-01 DIAGNOSIS — Z01419 Encounter for gynecological examination (general) (routine) without abnormal findings: Secondary | ICD-10-CM | POA: Diagnosis not present

## 2015-08-01 DIAGNOSIS — Z1231 Encounter for screening mammogram for malignant neoplasm of breast: Secondary | ICD-10-CM | POA: Diagnosis not present

## 2015-08-02 DIAGNOSIS — Z01419 Encounter for gynecological examination (general) (routine) without abnormal findings: Secondary | ICD-10-CM | POA: Diagnosis not present

## 2015-08-30 ENCOUNTER — Other Ambulatory Visit: Payer: Self-pay | Admitting: Physician Assistant

## 2015-09-01 DIAGNOSIS — K2270 Barrett's esophagus without dysplasia: Secondary | ICD-10-CM | POA: Diagnosis not present

## 2015-09-01 DIAGNOSIS — K219 Gastro-esophageal reflux disease without esophagitis: Secondary | ICD-10-CM | POA: Diagnosis not present

## 2015-09-01 DIAGNOSIS — K5900 Constipation, unspecified: Secondary | ICD-10-CM | POA: Diagnosis not present

## 2015-09-12 ENCOUNTER — Ambulatory Visit (INDEPENDENT_AMBULATORY_CARE_PROVIDER_SITE_OTHER): Payer: BLUE CROSS/BLUE SHIELD | Admitting: Physician Assistant

## 2015-09-12 ENCOUNTER — Encounter: Payer: Self-pay | Admitting: Physician Assistant

## 2015-09-12 ENCOUNTER — Other Ambulatory Visit: Payer: Self-pay | Admitting: Physician Assistant

## 2015-09-12 VITALS — BP 116/84 | HR 87 | Temp 97.9°F | Resp 16 | Ht 64.0 in | Wt 132.8 lb

## 2015-09-12 DIAGNOSIS — Z1389 Encounter for screening for other disorder: Secondary | ICD-10-CM

## 2015-09-12 DIAGNOSIS — F419 Anxiety disorder, unspecified: Secondary | ICD-10-CM

## 2015-09-12 DIAGNOSIS — R6889 Other general symptoms and signs: Secondary | ICD-10-CM | POA: Diagnosis not present

## 2015-09-12 DIAGNOSIS — R51 Headache: Secondary | ICD-10-CM | POA: Diagnosis not present

## 2015-09-12 DIAGNOSIS — Z0001 Encounter for general adult medical examination with abnormal findings: Secondary | ICD-10-CM

## 2015-09-12 DIAGNOSIS — Z79899 Other long term (current) drug therapy: Secondary | ICD-10-CM

## 2015-09-12 DIAGNOSIS — E785 Hyperlipidemia, unspecified: Secondary | ICD-10-CM

## 2015-09-12 DIAGNOSIS — E559 Vitamin D deficiency, unspecified: Secondary | ICD-10-CM

## 2015-09-12 DIAGNOSIS — K2100 Gastro-esophageal reflux disease with esophagitis, without bleeding: Secondary | ICD-10-CM

## 2015-09-12 DIAGNOSIS — D239 Other benign neoplasm of skin, unspecified: Secondary | ICD-10-CM | POA: Diagnosis not present

## 2015-09-12 DIAGNOSIS — R102 Pelvic and perineal pain: Secondary | ICD-10-CM

## 2015-09-12 DIAGNOSIS — F325 Major depressive disorder, single episode, in full remission: Secondary | ICD-10-CM | POA: Diagnosis not present

## 2015-09-12 DIAGNOSIS — T7840XD Allergy, unspecified, subsequent encounter: Secondary | ICD-10-CM

## 2015-09-12 DIAGNOSIS — L709 Acne, unspecified: Secondary | ICD-10-CM

## 2015-09-12 DIAGNOSIS — Z0000 Encounter for general adult medical examination without abnormal findings: Secondary | ICD-10-CM

## 2015-09-12 DIAGNOSIS — R519 Headache, unspecified: Secondary | ICD-10-CM

## 2015-09-12 DIAGNOSIS — K7689 Other specified diseases of liver: Secondary | ICD-10-CM

## 2015-09-12 DIAGNOSIS — K22719 Barrett's esophagus with dysplasia, unspecified: Secondary | ICD-10-CM

## 2015-09-12 DIAGNOSIS — K259 Gastric ulcer, unspecified as acute or chronic, without hemorrhage or perforation: Secondary | ICD-10-CM

## 2015-09-12 LAB — URINALYSIS, ROUTINE W REFLEX MICROSCOPIC
Bilirubin Urine: NEGATIVE
Glucose, UA: NEGATIVE
KETONES UR: NEGATIVE
LEUKOCYTES UA: NEGATIVE
NITRITE: NEGATIVE
PH: 6.5 (ref 5.0–8.0)
PROTEIN: NEGATIVE
Specific Gravity, Urine: 1.015 (ref 1.001–1.035)

## 2015-09-12 LAB — URINALYSIS, MICROSCOPIC ONLY
BACTERIA UA: NONE SEEN [HPF]
CASTS: NONE SEEN [LPF]
CRYSTALS: NONE SEEN [HPF]
RBC / HPF: >60 RBC/HPF — AB (ref <=2)
Squamous Epithelial / LPF: NONE SEEN [HPF] (ref <=5)
WBC, UA: NONE SEEN WBC/HPF (ref <=5)
YEAST: NONE SEEN [HPF]

## 2015-09-12 LAB — BASIC METABOLIC PANEL WITH GFR
BUN: 14 mg/dL (ref 7–25)
CO2: 29 mmol/L (ref 20–31)
Calcium: 8.6 mg/dL (ref 8.6–10.4)
Chloride: 103 mmol/L (ref 98–110)
Creat: 0.64 mg/dL (ref 0.50–1.05)
GFR, Est African American: >89 mL/min (ref >=60)
GLUCOSE: 77 mg/dL (ref 65–99)
POTASSIUM: 3.8 mmol/L (ref 3.5–5.3)
SODIUM: 140 mmol/L (ref 135–146)

## 2015-09-12 LAB — LIPID PANEL
Cholesterol: 194 mg/dL (ref 125–200)
HDL: 88 mg/dL (ref >=46)
LDL CALC: 80 mg/dL (ref <130)
TRIGLYCERIDES: 132 mg/dL (ref <150)
Total CHOL/HDL Ratio: 2.2 Ratio (ref <=5.0)
VLDL: 26 mg/dL (ref <30)

## 2015-09-12 LAB — CBC WITH DIFFERENTIAL/PLATELET
BASOS PCT: 1 %
Basophils Absolute: 49 cells/uL (ref 0–200)
EOS PCT: 4 %
Eosinophils Absolute: 196 cells/uL (ref 15–500)
HEMATOCRIT: 33.1 % — AB (ref 35.0–45.0)
HEMOGLOBIN: 11.4 g/dL — AB (ref 11.7–15.5)
LYMPHS ABS: 2058 {cells}/uL (ref 850–3900)
Lymphocytes Relative: 42 %
MCH: 32.9 pg (ref 27.0–33.0)
MCHC: 34.4 g/dL (ref 32.0–36.0)
MCV: 95.4 fL (ref 80.0–100.0)
MONO ABS: 637 {cells}/uL (ref 200–950)
MPV: 9 fL (ref 7.5–12.5)
Monocytes Relative: 13 %
NEUTROS ABS: 1960 {cells}/uL (ref 1500–7800)
NEUTROS PCT: 40 %
Platelets: 229 10*3/uL (ref 140–400)
RBC: 3.47 MIL/uL — AB (ref 3.80–5.10)
RDW: 12.9 % (ref 11.0–15.0)
WBC: 4.9 10*3/uL (ref 3.8–10.8)

## 2015-09-12 LAB — HEPATIC FUNCTION PANEL
ALK PHOS: 53 U/L (ref 33–130)
ALT: 13 U/L (ref 6–29)
AST: 14 U/L (ref 10–35)
Albumin: 3.9 g/dL (ref 3.6–5.1)
BILIRUBIN DIRECT: 0.1 mg/dL (ref <=0.2)
BILIRUBIN INDIRECT: 0.3 mg/dL (ref 0.2–1.2)
TOTAL PROTEIN: 6.5 g/dL (ref 6.1–8.1)
Total Bilirubin: 0.4 mg/dL (ref 0.2–1.2)

## 2015-09-12 LAB — MAGNESIUM: MAGNESIUM: 1.6 mg/dL (ref 1.5–2.5)

## 2015-09-12 LAB — MICROALBUMIN / CREATININE URINE RATIO
CREATININE, URINE: 87 mg/dL (ref 20–320)
MICROALB UR: 2.6 mg/dL — AB
MICROALB/CREAT RATIO: 30 ug/mg{creat} — AB (ref <30)

## 2015-09-12 LAB — TSH: TSH: 1.27 mIU/L

## 2015-09-12 MED ORDER — DIAZEPAM 5 MG PO TABS: ORAL_TABLET | ORAL | Status: DC

## 2015-09-12 MED ORDER — ESTRADIOL 2 MG PO TABS: 1.0000 mg | ORAL_TABLET | Freq: Every day | ORAL | Status: DC

## 2015-09-12 MED ORDER — AMPHETAMINE-DEXTROAMPHETAMINE 20 MG PO TABS: 10.0000 mg | ORAL_TABLET | Freq: Two times a day (BID) | ORAL | Status: DC

## 2015-09-12 MED ORDER — TRETINOIN 0.025 % EX GEL: Freq: Every day | CUTANEOUS | Status: DC

## 2015-09-12 MED ORDER — FLUOXETINE HCL 40 MG PO CAPS: 40.0000 mg | ORAL_CAPSULE | Freq: Every day | ORAL | Status: DC

## 2015-09-12 NOTE — Progress Notes (Signed)
Complete Physical  Assessment and Plan: 1. Hyperlipidemia LDL goal <100 - check lipids, decrease fatty foods, increase activity.  - CBC with Differential/Platelet - BASIC METABOLIC PANEL WITH GFR - Hepatic function panel - Lipid panel  2. Vitamin D deficiency - VITAMIN D 25 Hydroxy (Vit-D Deficiency, Fractures)  3. Medication management - Magnesium  4. Barrett's esophagus with dysplasia -controlled, continue the same medications  5. Gastric ulcer, unspecified chronicity Continue PPI/H2 blocker, diet discussed  6. Gastroesophageal reflux disease with esophagitis Continue PPI/H2 blocker, diet discussed  7. Liver cyst Monitor/benign, pt reassured  8. Dysplastic nevus Continue fu derm  9. Depression, major, in remission ([place]) - TSH - amphetamine-dextroamphetamine (ADDERALL) 20 MG tablet; Take 0.5 tablets (10 mg total) by mouth 2 (two) times daily with a meal.  Dispense: 60 tablet; Refill: 0 - FLUoxetine (PROZAC) 40 MG capsule; Take 1 capsule (40 mg total) by mouth daily.  Dispense: 90 capsule; Refill: 1  10. Anxiety - TSH - diazepam (VALIUM) 5 MG tablet; 1/2-1 po BID for sleep/anxiety  Dispense: 60 tablet; Refill: 2  11. Nonintractable headache, unspecified chronicity pattern, unspecified headache type Continue meds PRN, get on allergy pill  12. Allergy, subsequent encounter - Allegra OTC, increase H20, allergy hygiene explained.  13. Routine general medical examination at a health care facility - CBC with Differential/Platelet - BASIC METABOLIC PANEL WITH GFR - Hepatic function panel - TSH - Lipid panel - Magnesium - VITAMIN D 25 Hydroxy (Vit-D Deficiency, Fractures) - Urinalysis, Routine w reflex microscopic (not at [facility]) - Microalbumin / creatinine urine ratio  14. Vaginal pain Declines pelvic at this time, check urine, if negative needs pelvic exam - Urine culture  15. Screening for blood or protein in urine - Urinalysis, Routine w reflex microscopic (not  at [facility]) - Microalbumin / creatinine urine ratio  16. Acne, unspecified acne type - tretinoin (RETIN-A) 0.025 % gel; Apply topically at bedtime.  Dispense: 45 g; Refill: 1  Discussed med's effects and SE's. Screening labs and tests as requested with regular follow-up as recommended. Over 40 minutes of exam, counseling, chart review, and complex, high level critical decision making was performed this visit.   HPI  55 y.o. female  presents for a complete physical and follow up for depression, chol, vitamin D def, and Barrett's.   Her blood pressure has been controlled at home, today their BP is BP: 116/84 mmHg She does not workout, not working out at this time. She denies chest pain, shortness of breath, dizziness. She was having CP and dyspnea, had normal workup with Dr. Ashok Cordia, has resolved. Negative autoimmune work up.  This Am she has been having some vaginal stabbing pain, no dysuria.  She is not on cholesterol medication and denies myalgias. Her cholesterol is at goal. The cholesterol last visit was:   Lab Results  Component Value Date   CHOL 195 09/12/2014   HDL 89 09/12/2014   LDLCALC 93 09/12/2014   TRIG 66 09/12/2014   CHOLHDL 2.2 09/12/2014    Last A1C in the office was:  Lab Results  Component Value Date   HGBA1C 5.0 09/08/2013   Patient is on Vitamin D supplement.   Lab Results  Component Value Date   VD25OH 30 03/14/2015     She had some pseudodementia with normal testing after her grandmother passed, currently on prozac, adderall, and xanax PRN. Doing well. Some stress with her youngest daughter, Cyril Mourning and with lay offs at her job.  Headaches well controlled, some sinus HA's  recently, on allergy med and doing well.  She has history of dysplastic nevus, we are monitoring, follows Dr. Ubaldo Glassing.  She has had TAH and has osteopenia, on estrogen for osteopenia as well as hot flashes, on bASA.    Current Medications:  Current Outpatient Prescriptions on File Prior to  Visit  Medication Sig Dispense Refill  . albuterol (PROVENTIL HFA;VENTOLIN HFA) 108 (90 BASE) MCG/ACT inhaler Inhale 2 puffs into the lungs every 6 (six) hours as needed for wheezing or shortness of breath. 1 Inhaler 2  . ALPRAZolam (XANAX) 0.5 MG tablet Take 1 tablet (0.5 mg total) by mouth 3 (three) times daily as needed for sleep or anxiety. 30 tablet 0  . amphetamine-dextroamphetamine (ADDERALL) 20 MG tablet Take 0.5 tablets (10 mg total) by mouth 2 (two) times daily with a meal. 60 tablet 0  . buPROPion (WELLBUTRIN XL) 300 MG 24 hr tablet take 1 tablet by mouth every morning 90 tablet 1  . butalbital-acetaminophen-caffeine (FIORICET, ESGIC) 50-325-40 MG tablet Take 1 to 2 tablets 4 x /daily if needed for Headaches 60 tablet 2  . cyclobenzaprine (FLEXERIL) 10 MG tablet Take 10 mg by mouth 3 (three) times daily as needed for muscle spasms.    . Dexlansoprazole (DEXILANT PO) Take 500 mg by mouth.    . diazepam (VALIUM) 5 MG tablet 1/2-1 po BID for sleep/anxiety 60 tablet 2  . estradiol (ESTRACE) 2 MG tablet take 1/2 to 1 tablet by mouth daily 30 tablet 6  . FLUoxetine (PROZAC) 40 MG capsule Take 1 capsule (40 mg total) by mouth daily. 30 capsule 6  . Lubiprostone (AMITIZA PO) Take by mouth.    . meloxicam (MOBIC) 15 MG tablet Take one pill daily with food as needed for pain 30 tablet 3   No current facility-administered medications on file prior to visit.   Allergies:  Allergies  Allergen Reactions  . Brintellix [Vortioxetine] Nausea Only   Medical History:  Shehas Allergy; Anxiety; Depression, major, in remission ([place]); Gastric ulcer; Neck pain; Headache; Hyperlipidemia LDL goal <100; Dysplastic nevus; Vitamin D deficiency; Gastroesophageal reflux disease with esophagitis; Barrett's esophagus; and Liver cyst on her problem list.   Health Maintenance:   Immunization History  Administered Date(s) Administered  . Influenza Split 12/25/2012, 12/20/2014  . Tdap 09/04/2012    Tetanus:  2014 Pneumovax: N/A Prevnar 13:  N/A Flu vaccine: 2016 Zostavax: N/A  LMP TAH Pap: 2015 Dr. Corinna Capra, due MGM: 04/2015 gets with Dr. Corinna Capra  DEXA: 2015 osteopenia Dr. Corinna Capra, due this year but will get 2018 Colonoscopy: Aug 2016 with Dr. Ferdinand Lango, [place], due 2019 EGD: 2017, + gastritis/ulcer still, was on reglan for 1 month CXR 2012 CT AB 2001 CTA chest negative 2017 PFTs 2017 normal, Dr. Ashok Cordia  Patient Care Team: Unk Pinto, MD as PCP - General (Internal Medicine) Donell Beers, MD as Referring Physician (Orthopedic Surgery) Melrose Nakayama, MD as Consulting Physician (Orthopedic Surgery)  Surgical History:  She  has past surgical history that includes Abdominal hysterectomy; Cesarean section; Bladder suspension; and Tonsillectomy. Family History:  She had a family history includes Asthma in her paternal grandmother; COPD in her maternal grandmother; Heart disease in her father and paternal grandfather; Hyperlipidemia in her father; Hypertension in her father and mother; Pulmonary embolism in her maternal grandmother. Social History:  She  reports that she has been passively smoking.  She has never used smokeless tobacco. She reports that she drinks alcohol. She reports that she does not use illicit drugs.  Review of Systems: Review of Systems  Constitutional: Negative.   HENT: Negative.   Eyes: Negative.   Respiratory: Negative.   Cardiovascular: Negative.   Gastrointestinal: Negative.   Genitourinary: Positive for frequency. Negative for dysuria, urgency, hematuria and flank pain.  Musculoskeletal: Negative.   Skin: Negative.   Neurological: Negative.   Endo/Heme/Allergies: Negative.   Psychiatric/Behavioral: Negative for depression, suicidal ideas, hallucinations, memory loss and substance abuse. The patient is nervous/anxious and has insomnia.     Physical Exam: Estimated body mass index is 22.78 kg/(m^2) as calculated from the following:   Height as  of this encounter: 5\' 4"  (1.626 m).   Weight as of this encounter: 132 lb 12.8 oz (60.238 kg). BP 116/84 mmHg  Pulse 87  Temp(Src) 97.9 F (36.6 C) (Temporal)  Resp 16  Ht 5\' 4"  (1.626 m)  Wt 132 lb 12.8 oz (60.238 kg)  BMI 22.78 kg/m2  SpO2 99% General Appearance: Well nourished, in no apparent distress.  Eyes: PERRLA, EOMs, conjunctiva no swelling or erythema, normal fundi and vessels.  Sinuses: No Frontal/maxillary tenderness  ENT/Mouth: Ext aud canals clear, normal light reflex with TMs without erythema, bulging. Good dentition. No erythema, swelling, or exudate on post pharynx. Tonsils not swollen or erythematous. Hearing normal.  Neck: Supple, thyroid normal. No bruits  Respiratory: Respiratory effort normal, BS equal bilaterally without rales, rhonchi, wheezing or stridor.  Cardio: RRR without murmurs, rubs or gallops. Brisk peripheral pulses without edema.  Chest: symmetric, with normal excursions and percussion.  Breasts: defer  Abdomen: Soft, nontender, no guarding, rebound, hernias, masses, or organomegaly.  Lymphatics: Non tender without lymphadenopathy.  Genitourinary: defer Musculoskeletal: Full ROM all peripheral extremities,5/5 strength, and normal gait.  Skin: Warm, dry without rashes, lesions, ecchymosis. Neuro: Cranial nerves intact, reflexes equal bilaterally. Normal muscle tone, no cerebellar symptoms. Sensation intact.  Psych: Awake and oriented X 3, normal affect, Insight and Judgment appropriate.   EKG: declines AORTA SCAN: declines   Vicie Mutters 9:15 AM [facility]

## 2015-09-12 NOTE — Patient Instructions (Addendum)
Add ENTERIC COATED low dose 81 mg Aspirin daily OR can do every other day if you have easy bruising to protect your heart and head. As well as to reduce risk of Colon Cancer by 20 %, Skin Cancer by 26 % , Melanoma by 46% and Pancreatic cancer by 60%  Ask Dr. Ferdinand Lango if you can be on enteric coated 67m ASA since you are on estrogen.   Get back to exercise, slowly but surely  Preventive Care for Adults  A healthy lifestyle and preventive care can promote health and wellness. Preventive health guidelines for women include the following key practices.  A routine yearly physical is a good way to check with your health care provider about your health and preventive screening. It is a chance to share any concerns and updates on your health and to receive a thorough exam.  Visit your dentist for a routine exam and preventive care every 6 months. Brush your teeth twice a day and floss once a day. Good oral hygiene prevents tooth decay and gum disease.  The frequency of eye exams is based on your age, health, family medical history, use of contact lenses, and other factors. Follow your health care provider's recommendations for frequency of eye exams.  Eat a healthy diet. Foods like vegetables, fruits, whole grains, low-fat dairy products, and lean protein foods contain the nutrients you need without too many calories. Decrease your intake of foods high in solid fats, added sugars, and salt. Eat the right amount of calories for you.Get information about a proper diet from your health care provider, if necessary.  Regular physical exercise is one of the most important things you can do for your health. Most adults should get at least 150 minutes of moderate-intensity exercise (any activity that increases your heart rate and causes you to sweat) each week. In addition, most adults need muscle-strengthening exercises on 2 or more days a week.  Maintain a healthy weight. The body mass index (BMI) is a  screening tool to identify possible weight problems. It provides an estimate of body fat based on height and weight. Your health care provider can find your BMI and can help you achieve or maintain a healthy weight.For adults 20 years and older:  A BMI below 18.5 is considered underweight.  A BMI of 18.5 to 24.9 is normal.  A BMI of 25 to 29.9 is considered overweight.  A BMI of 30 and above is considered obese.  Maintain normal blood lipids and cholesterol levels by exercising and minimizing your intake of saturated fat. Eat a balanced diet with plenty of fruit and vegetables. Blood tests for lipids and cholesterol should begin at age 648and be repeated every 5 years. If your lipid or cholesterol levels are high, you are over 50, or you are at high risk for heart disease, you may need your cholesterol levels checked more frequently.Ongoing high lipid and cholesterol levels should be treated with medicines if diet and exercise are not working.  If you smoke, find out from your health care provider how to quit. If you do not use tobacco, do not start.  Lung cancer screening is recommended for adults aged 561-80years who are at high risk for developing lung cancer because of a history of smoking. A yearly low-dose CT scan of the lungs is recommended for people who have at least a 30-pack-year history of smoking and are a current smoker or have quit within the past 15 years. A pack year of  smoking is smoking an average of 1 pack of cigarettes a day for 1 year (for example: 1 pack a day for 30 years or 2 packs a day for 15 years). Yearly screening should continue until the smoker has stopped smoking for at least 15 years. Yearly screening should be stopped for people who develop a health problem that would prevent them from having lung cancer treatment.  High blood pressure causes heart disease and increases the risk of stroke. Your blood pressure should be checked at least every 1 to 2 years. Ongoing  high blood pressure should be treated with medicines if weight loss and exercise do not work.  If you are 78-78 years old, ask your health care provider if you should take aspirin to prevent strokes.  Diabetes screening involves taking a blood sample to check your fasting blood sugar level. This should be done once every 3 years, after age 104, if you are within normal weight and without risk factors for diabetes. Testing should be considered at a younger age or be carried out more frequently if you are overweight and have at least 1 risk factor for diabetes.  Breast cancer screening is essential preventive care for women. You should practice "breast self-awareness." This means understanding the normal appearance and feel of your breasts and may include breast self-examination. Any changes detected, no matter how small, should be reported to a health care provider. Women in their 45s and 30s should have a clinical breast exam (CBE) by a health care provider as part of a regular health exam every 1 to 3 years. After age 83, women should have a CBE every year. Starting at age 22, women should consider having a mammogram (breast X-ray test) every year. Women who have a family history of breast cancer should talk to their health care provider about genetic screening. Women at a high risk of breast cancer should talk to their health care providers about having an MRI and a mammogram every year.  Breast cancer gene (BRCA)-related cancer risk assessment is recommended for women who have family members with BRCA-related cancers. BRCA-related cancers include breast, ovarian, tubal, and peritoneal cancers. Having family members with these cancers may be associated with an increased risk for harmful changes (mutations) in the breast cancer genes BRCA1 and BRCA2. Results of the assessment will determine the need for genetic counseling and BRCA1 and BRCA2 testing.  Routine pelvic exams to screen for cancer are no longer  recommended for nonpregnant women who are considered low risk for cancer of the pelvic organs (ovaries, uterus, and vagina) and who do not have symptoms. Ask your health care provider if a screening pelvic exam is right for you.  If you have had past treatment for cervical cancer or a condition that could lead to cancer, you need Pap tests and screening for cancer for at least 20 years after your treatment. If Pap tests have been discontinued, your risk factors (such as having a new sexual partner) need to be reassessed to determine if screening should be resumed. Some women have medical problems that increase the chance of getting cervical cancer. In these cases, your health care provider may recommend more frequent screening and Pap tests.  Colorectal cancer can be detected and often prevented. Most routine colorectal cancer screening begins at the age of 40 years and continues through age 39 years. However, your health care provider may recommend screening at an earlier age if you have risk factors for colon cancer. On a yearly  basis, your health care provider may provide home test kits to check for hidden blood in the stool. Use of a small camera at the end of a tube, to directly examine the colon (sigmoidoscopy or colonoscopy), can detect the earliest forms of colorectal cancer. Talk to your health care provider about this at age 24, when routine screening begins. Direct exam of the colon should be repeated every 5-10 years through age 12 years, unless early forms of pre-cancerous polyps or small growths are found.  Hepatitis C blood testing is recommended for all people born from [birth year] through 1965 and any individual with known risks for hepatitis C.  Pra  Osteoporosis is a disease in which the bones lose minerals and strength with aging. This can result in serious bone fractures or breaks. The risk of osteoporosis can be identified using a bone density scan. Women ages 25 years and over and women at  risk for fractures or osteoporosis should discuss screening with their health care providers. Ask your health care provider whether you should take a calcium supplement or vitamin D to reduce the rate of osteoporosis.  Menopause can be associated with physical symptoms and risks. Hormone replacement therapy is available to decrease symptoms and risks. You should talk to your health care provider about whether hormone replacement therapy is right for you.  Use sunscreen. Apply sunscreen liberally and repeatedly throughout the day. You should seek shade when your shadow is shorter than you. Protect yourself by wearing long sleeves, pants, a wide-brimmed hat, and sunglasses year round, whenever you are outdoors.  Once a month, do a whole body skin exam, using a mirror to look at the skin on your back. Tell your health care provider of new moles, moles that have irregular borders, moles that are larger than a pencil eraser, or moles that have changed in shape or color.  Stay current with required vaccines (immunizations).  Influenza vaccine. All adults should be immunized every year.  Tetanus, diphtheria, and acellular pertussis (Td, Tdap) vaccine. Pregnant women should receive 1 dose of Tdap vaccine during each pregnancy. The dose should be obtained regardless of the length of time since the last dose. Immunization is preferred during the 27th-36th week of gestation. An adult who has not previously received Tdap or who does not know her vaccine status should receive 1 dose of Tdap. This initial dose should be followed by tetanus and diphtheria toxoids (Td) booster doses every 10 years. Adults with an unknown or incomplete history of completing a 3-dose immunization series with Td-containing vaccines should begin or complete a primary immunization series including a Tdap dose. Adults should receive a Td booster every 10 years.  Varicella vaccine. An adult without evidence of immunity to varicella should  receive 2 doses or a second dose if she has previously received 1 dose. Pregnant females who do not have evidence of immunity should receive the first dose after pregnancy. This first dose should be obtained before leaving the health care facility. The second dose should be obtained 4-8 weeks after the first dose.  Human papillomavirus (HPV) vaccine. Females aged 13-26 years who have not received the vaccine previously should obtain the 3-dose series. The vaccine is not recommended for use in pregnant females. However, pregnancy testing is not needed before receiving a dose. If a female is found to be pregnant after receiving a dose, no treatment is needed. In that case, the remaining doses should be delayed until after the pregnancy. Immunization is recommended for any  person with an immunocompromised condition through the age of 65 years if she did not get any or all doses earlier. During the 3-dose series, the second dose should be obtained 4-8 weeks after the first dose. The third dose should be obtained 24 weeks after the first dose and 16 weeks after the second dose.  Zoster vaccine. One dose is recommended for adults aged 66 years or older unless certain conditions are present.  Measles, mumps, and rubella (MMR) vaccine. Adults born before [birth year] generally are considered immune to measles and mumps. Adults born in [birth year] or later should have 1 or more doses of MMR vaccine unless there is a contraindication to the vaccine or there is laboratory evidence of immunity to each of the three diseases. A routine second dose of MMR vaccine should be obtained at least 28 days after the first dose for students attending postsecondary schools, health care workers, or international travelers. People who received inactivated measles vaccine or an unknown type of measles vaccine during 1963-1967 should receive 2 doses of MMR vaccine. People who received inactivated mumps vaccine or an unknown type of mumps vaccine before  1979 and are at high risk for mumps infection should consider immunization with 2 doses of MMR vaccine. For females of childbearing age, rubella immunity should be determined. If there is no evidence of immunity, females who are not pregnant should be vaccinated. If there is no evidence of immunity, females who are pregnant should delay immunization until after pregnancy. Unvaccinated health care workers born before [birth year] who lack laboratory evidence of measles, mumps, or rubella immunity or laboratory confirmation of disease should consider measles and mumps immunization with 2 doses of MMR vaccine or rubella immunization with 1 dose of MMR vaccine.  Pneumococcal 13-valent conjugate (PCV13) vaccine. When indicated, a person who is uncertain of her immunization history and has no record of immunization should receive the PCV13 vaccine. An adult aged 39 years or older who has certain medical conditions and has not been previously immunized should receive 1 dose of PCV13 vaccine. This PCV13 should be followed with a dose of pneumococcal polysaccharide (PPSV23) vaccine. The PPSV23 vaccine dose should be obtained at least 8 weeks after the dose of PCV13 vaccine. An adult aged 31 years or older who has certain medical conditions and previously received 1 or more doses of PPSV23 vaccine should receive 1 dose of PCV13. The PCV13 vaccine dose should be obtained 1 or more years after the last PPSV23 vaccine dose.    Pneumococcal polysaccharide (PPSV23) vaccine. When PCV13 is also indicated, PCV13 should be obtained first. All adults aged 71 years and older should be immunized. An adult younger than age 34 years who has certain medical conditions should be immunized. Any person who resides in a nursing home or long-term care facility should be immunized. An adult smoker should be immunized. People with an immunocompromised condition and certain other conditions should receive both PCV13 and PPSV23 vaccines. People with  human immunodeficiency virus (HIV) infection should be immunized as soon as possible after diagnosis. Immunization during chemotherapy or radiation therapy should be avoided. Routine use of PPSV23 vaccine is not recommended for American Indians, [place] Natives, or people younger than 65 years unless there are medical conditions that require PPSV23 vaccine. When indicated, people who have unknown immunization and have no record of immunization should receive PPSV23 vaccine. One-time revaccination 5 years after the first dose of PPSV23 is recommended for people aged 19-64 years who have chronic kidney failure, nephrotic  syndrome, asplenia, or immunocompromised conditions. People who received 1-2 doses of PPSV23 before age 77 years should receive another dose of PPSV23 vaccine at age 88 years or later if at least 5 years have passed since the previous dose. Doses of PPSV23 are not needed for people immunized with PPSV23 at or after age 23 years.  Preventive Services / Frequency   Ages 3 to 84 years  Blood pressure check.  Lipid and cholesterol check.  Lung cancer screening. / Every year if you are aged 75-80 years and have a 30-pack-year history of smoking and currently smoke or have quit within the past 15 years. Yearly screening is stopped once you have quit smoking for at least 15 years or develop a health problem that would prevent you from having lung cancer treatment.  Clinical breast exam.** / Every year after age 64 years.  BRCA-related cancer risk assessment.** / For women who have family members with a BRCA-related cancer (breast, ovarian, tubal, or peritoneal cancers).  Mammogram.** / Every year beginning at age 2 years and continuing for as long as you are in good health. Consult with your health care provider.  Pap test.** / Every 3 years starting at age 57 years through age 70 or 25 years with a history of 3 consecutive normal Pap tests.  HPV screening.** / Every 3 years from ages  50 years through ages 101 to 22 years with a history of 3 consecutive normal Pap tests.  Fecal occult blood test (FOBT) of stool. / Every year beginning at age 97 years and continuing until age 15 years. You may not need to do this test if you get a colonoscopy every 10 years.  Flexible sigmoidoscopy or colonoscopy.** / Every 5 years for a flexible sigmoidoscopy or every 10 years for a colonoscopy beginning at age 31 years and continuing until age 12 years.  Hepatitis C blood test.** / For all people born from [birth year] through 1963 and any individual with known risks for hepatitis C.  Skin self-exam. / Monthly.  Influenza vaccine. / Every year.  Tetanus, diphtheria, and acellular pertussis (Tdap/Td) vaccine.** / Consult your health care provider. Pregnant women should receive 1 dose of Tdap vaccine during each pregnancy. 1 dose of Td every 10 years.  Varicella vaccine.** / Consult your health care provider. Pregnant females who do not have evidence of immunity should receive the first dose after pregnancy.  Zoster vaccine.** / 1 dose for adults aged 22 years or older.  Pneumococcal 13-valent conjugate (PCV13) vaccine.** / Consult your health care provider.  Pneumococcal polysaccharide (PPSV23) vaccine.** / 1 to 2 doses if you smoke cigarettes or if you have certain conditions.  Meningococcal vaccine.** / Consult your health care provider.  Hepatitis A vaccine.** / Consult your health care provider.  Hepatitis B vaccine.** / Consult your health care provider. Screening for abdominal aortic aneurysm (AAA)  by ultrasound is recommended for people over 50 who have history of high blood pressure or who are current or former smokers.

## 2015-09-13 LAB — URINE CULTURE
Colony Count: NO GROWTH
Organism ID, Bacteria: NO GROWTH

## 2015-09-13 LAB — VITAMIN D 25 HYDROXY (VIT D DEFICIENCY, FRACTURES): Vit D, 25-Hydroxy: 32 ng/mL (ref 30–100)

## 2015-10-04 DIAGNOSIS — Z713 Dietary counseling and surveillance: Secondary | ICD-10-CM | POA: Diagnosis not present

## 2015-10-10 DIAGNOSIS — K219 Gastro-esophageal reflux disease without esophagitis: Secondary | ICD-10-CM | POA: Diagnosis not present

## 2015-10-10 DIAGNOSIS — K2270 Barrett's esophagus without dysplasia: Secondary | ICD-10-CM | POA: Diagnosis not present

## 2015-10-10 DIAGNOSIS — K5900 Constipation, unspecified: Secondary | ICD-10-CM | POA: Diagnosis not present

## 2015-11-13 ENCOUNTER — Other Ambulatory Visit: Payer: Self-pay | Admitting: Internal Medicine

## 2015-11-13 DIAGNOSIS — G44209 Tension-type headache, unspecified, not intractable: Secondary | ICD-10-CM

## 2015-11-14 DIAGNOSIS — K219 Gastro-esophageal reflux disease without esophagitis: Secondary | ICD-10-CM | POA: Diagnosis not present

## 2015-11-14 DIAGNOSIS — Z1211 Encounter for screening for malignant neoplasm of colon: Secondary | ICD-10-CM | POA: Diagnosis not present

## 2015-11-14 DIAGNOSIS — K5900 Constipation, unspecified: Secondary | ICD-10-CM | POA: Diagnosis not present

## 2015-11-14 DIAGNOSIS — K2270 Barrett's esophagus without dysplasia: Secondary | ICD-10-CM | POA: Diagnosis not present

## 2015-12-07 ENCOUNTER — Other Ambulatory Visit: Payer: Self-pay | Admitting: Physician Assistant

## 2015-12-07 DIAGNOSIS — F325 Major depressive disorder, single episode, in full remission: Secondary | ICD-10-CM

## 2015-12-07 MED ORDER — AMPHETAMINE-DEXTROAMPHETAMINE 20 MG PO TABS: 10.0000 mg | ORAL_TABLET | Freq: Two times a day (BID) | ORAL | 0 refills | Status: DC

## 2015-12-29 DIAGNOSIS — Z23 Encounter for immunization: Secondary | ICD-10-CM | POA: Diagnosis not present

## 2016-01-16 DIAGNOSIS — K5900 Constipation, unspecified: Secondary | ICD-10-CM | POA: Diagnosis not present

## 2016-01-17 DIAGNOSIS — Z713 Dietary counseling and surveillance: Secondary | ICD-10-CM | POA: Diagnosis not present

## 2016-02-01 ENCOUNTER — Other Ambulatory Visit: Payer: Self-pay | Admitting: Internal Medicine

## 2016-02-01 DIAGNOSIS — G44209 Tension-type headache, unspecified, not intractable: Secondary | ICD-10-CM

## 2016-02-01 MED ORDER — BUTALBITAL-APAP-CAFFEINE 50-325-40 MG PO TABS: 1.0000 | ORAL_TABLET | Freq: Four times a day (QID) | ORAL | 0 refills | Status: DC | PRN

## 2016-02-01 NOTE — Telephone Encounter (Signed)
Rx called into [facility] pharmacy.

## 2016-03-08 ENCOUNTER — Encounter: Payer: Self-pay | Admitting: Internal Medicine

## 2016-03-08 ENCOUNTER — Ambulatory Visit (INDEPENDENT_AMBULATORY_CARE_PROVIDER_SITE_OTHER): Payer: BLUE CROSS/BLUE SHIELD | Admitting: Internal Medicine

## 2016-03-08 VITALS — BP 92/58 | HR 82 | Temp 98.0°F | Resp 16 | Ht 64.0 in | Wt 126.0 lb

## 2016-03-08 DIAGNOSIS — J209 Acute bronchitis, unspecified: Secondary | ICD-10-CM

## 2016-03-08 MED ORDER — AZITHROMYCIN 250 MG PO TABS: ORAL_TABLET | ORAL | 0 refills | Status: DC

## 2016-03-08 MED ORDER — DEXAMETHASONE SODIUM PHOSPHATE 10 MG/ML IJ SOLN
10.0000 mg | Freq: Once | INTRAMUSCULAR | Status: AC
  Administered 2016-03-08: 10 mg via INTRAMUSCULAR

## 2016-03-08 MED ORDER — FLUTICASONE PROPIONATE 50 MCG/ACT NA SUSP: 2.0000 | Freq: Every day | NASAL | 0 refills | Status: DC

## 2016-03-08 MED ORDER — PREDNISONE 20 MG PO TABS: ORAL_TABLET | ORAL | 0 refills | Status: DC

## 2016-03-08 MED ORDER — PROMETHAZINE-DM 6.25-15 MG/5ML PO SYRP: ORAL_SOLUTION | ORAL | 1 refills | Status: DC

## 2016-03-08 NOTE — Patient Instructions (Signed)
Please take the zpak until it is completely gone.  Please take prednisone as prescribed until it is completely gone.  Please take flonase 2 sprays per nostril.  Please use nasal saline spray at least twice daily to help moisturize your nose.  Please Drink plenty of fluids.  Take cough syrup 3 times daily as needed for cough and congestion.  Please take claritin, zyrtec or allegra.  Please use symbicort 2 puffs twice daily.    Please go to ER if worsening shortness of breath.

## 2016-03-08 NOTE — Progress Notes (Signed)
HPI  Patient presents to the office for evaluation of cough.  It has been going on for 3 days.  Patient reports night > day, wet, worse with lying down.  They also endorse change in voice, postnasal drip, shortness of breath and chest tightness, headaches, body aches, green and brown sputum production.  .  They have tried advil allergy and sinus.  They report that nothing has worked.  They denies other sick contacts.  Review of Systems  Constitutional: Positive for malaise/fatigue. Negative for chills and fever.  HENT: Positive for congestion, ear pain, hearing loss and sore throat.   Respiratory: Positive for cough. Negative for sputum production, shortness of breath and wheezing.   Cardiovascular: Negative for chest pain, palpitations and leg swelling.  Neurological: Positive for headaches.    PE:  Vitals:   03/08/16 1132  BP: (!) 92/58  Pulse: 82  Resp: 16  Temp: 98 F (36.7 C)    General:  Alert and non-toxic, WDWN, NAD HEENT: NCAT, PERLA, EOM normal, no occular discharge or erythema.  Nasal mucosal edema with sinus tenderness to palpation.  Oropharynx clear with minimal oropharyngeal edema and erythema.  Mucous membranes moist and pink. Neck:  Cervical adenopathy Chest:  RRR no MRGs.  Lungs clear to auscultation A&P with no wheezes rhonchi or rales.   Abdomen: +BS x 4 quadrants, soft, non-tender, no guarding, rigidity, or rebound. Skin: warm and dry no rash Neuro: A&Ox4, CN II-XII grossly intact  Assessment and Plan:   1. Acute bronchitis, unspecified organism -prednisone -flonase -zpak -cont daily antihistamine -benadryl at bedtime -nasal saline - dexamethasone (DECADRON) injection 10 mg; Inject 1 mL (10 mg total) into the muscle once.

## 2016-03-13 ENCOUNTER — Encounter: Payer: Self-pay | Admitting: Physician Assistant

## 2016-03-13 ENCOUNTER — Ambulatory Visit (INDEPENDENT_AMBULATORY_CARE_PROVIDER_SITE_OTHER): Payer: BLUE CROSS/BLUE SHIELD | Admitting: Physician Assistant

## 2016-03-13 VITALS — BP 138/70 | HR 83 | Temp 97.4°F | Resp 16 | Ht 64.0 in | Wt 131.4 lb

## 2016-03-13 DIAGNOSIS — Z79899 Other long term (current) drug therapy: Secondary | ICD-10-CM

## 2016-03-13 DIAGNOSIS — J42 Unspecified chronic bronchitis: Secondary | ICD-10-CM

## 2016-03-13 DIAGNOSIS — E785 Hyperlipidemia, unspecified: Secondary | ICD-10-CM

## 2016-03-13 DIAGNOSIS — E559 Vitamin D deficiency, unspecified: Secondary | ICD-10-CM

## 2016-03-13 DIAGNOSIS — F325 Major depressive disorder, single episode, in full remission: Secondary | ICD-10-CM

## 2016-03-13 MED ORDER — PROMETHAZINE-CODEINE 6.25-10 MG/5ML PO SYRP: 5.0000 mL | ORAL_SOLUTION | Freq: Four times a day (QID) | ORAL | 0 refills | Status: DC | PRN

## 2016-03-13 MED ORDER — ALPRAZOLAM 0.5 MG PO TABS: 0.5000 mg | ORAL_TABLET | Freq: Three times a day (TID) | ORAL | 1 refills | Status: DC | PRN

## 2016-03-13 MED ORDER — ALBUTEROL SULFATE HFA 108 (90 BASE) MCG/ACT IN AERS: 2.0000 | INHALATION_SPRAY | Freq: Four times a day (QID) | RESPIRATORY_TRACT | 2 refills | Status: DC | PRN

## 2016-03-13 MED ORDER — AMPHETAMINE-DEXTROAMPHETAMINE 20 MG PO TABS: 10.0000 mg | ORAL_TABLET | Freq: Two times a day (BID) | ORAL | 0 refills | Status: DC

## 2016-03-13 MED ORDER — BENZONATATE 100 MG PO CAPS: 200.0000 mg | ORAL_CAPSULE | Freq: Three times a day (TID) | ORAL | 0 refills | Status: DC | PRN

## 2016-03-13 NOTE — Progress Notes (Signed)
Assessment and Plan:   Hyperlipidemia LDL goal <100 -continue medications, check lipids, decrease fatty foods, increase activity.  - CBC with Differential/Platelet - BASIC METABOLIC PANEL WITH GFR - Hepatic function panel - TSH  Depression - TSH   Gastroesophageal reflux disease with esophagitis Continue PPI/H2 blocker, diet discussed   Nonintractable headache, unspecified chronicity pattern, unspecified headache type Better, avoid triggers   Vitamin D deficiency - VITAMIN D 25 Hydroxy (Vit-D Deficiency, Fractures)   Medication management - Magnesium  Gastric ulcer, unspecified chronicity with barret's esophagus Continue dexilant, repeat EGD 3 years  Bronchitis Treated with zpak, still on prednisone, on flonase, on symbicort - continue the same  Continue diet and meds as discussed. Further disposition pending results of labs. LABS DONE AT WORK Over 30 minutes of exam, counseling, chart review, and critical decision making was performed Future Appointments Date Time Provider [place]  03/13/2016 4:30 PM Vicie Mutters, PA-C [facility] None  09/12/2016 9:00 AM Vicie Mutters, PA-C [facility] None    HPI 55 y.o. female  presents for 6 month follow up on GERD, depression, HA, hyperlipidemia and vitamin D def.   Her blood pressure has been controlled at home, today their BP is    She does workout. She denies chest pain, shortness of breath, dizziness. She is on estrogen for menopausal symptoms, she is on bASA.  She has had some urinary frequency.   She is not on cholesterol medication and denies myalgias. Her cholesterol is at goal. The cholesterol last visit was:   Lab Results  Component Value Date   CHOL 194 09/12/2015   HDL 88 09/12/2015   LDLCALC 80 09/12/2015   TRIG 132 09/12/2015   CHOLHDL 2.2 09/12/2015   Last A1C in the office was:  Lab Results  Component Value Date   HGBA1C 5.0 09/08/2013   She has depression/anxiety, on prozac wellbutrin, and  adderall and xanax PRN. States better controlled, holidays was hard with her grandma passing sept last year, and has been busy at work. She follows with [facility] for GERD, had another endo and colonoscopy, had another ulcer and barret's esphoagus. Will do repeat colon in 5 years and EGD in 3 years.  Needs to go see Dr. Corinna Capra first of year for [facility] and pelvic exam.  Patient is on Vitamin D supplement.   Lab Results  Component Value Date   VD25OH 32 09/12/2015      Current Medications:  Current Outpatient Prescriptions on File Prior to Visit  Medication Sig Dispense Refill  . albuterol (PROVENTIL HFA;VENTOLIN HFA) 108 (90 BASE) MCG/ACT inhaler Inhale 2 puffs into the lungs every 6 (six) hours as needed for wheezing or shortness of breath. 1 Inhaler 2  . ALPRAZolam (XANAX) 0.5 MG tablet Take 1 tablet (0.5 mg total) by mouth 3 (three) times daily as needed for sleep or anxiety. 30 tablet 0  . amphetamine-dextroamphetamine (ADDERALL) 20 MG tablet Take 0.5 tablets (10 mg total) by mouth 2 (two) times daily with a meal. 60 tablet 0  . azithromycin (ZITHROMAX Z-PAK) 250 MG tablet 2 po day one, then 1 daily x 4 days 6 tablet 0  . buPROPion (WELLBUTRIN XL) 300 MG 24 hr tablet take 1 tablet by mouth every morning 90 tablet 0  . butalbital-acetaminophen-caffeine (FIORICET, ESGIC) 50-325-40 MG tablet Take 1 tablet by mouth every 6 (six) hours as needed for headache. 60 tablet 0  . cyclobenzaprine (FLEXERIL) 10 MG tablet Take 10 mg by mouth 3 (three) times daily as needed for  muscle spasms.    . Dexlansoprazole (DEXILANT PO) Take 500 mg by mouth.    . diazepam (VALIUM) 5 MG tablet 1/2-1 po BID for sleep/anxiety 60 tablet 2  . estradiol (ESTRACE) 2 MG tablet Take 0.5-1 tablets (1-2 mg total) by mouth daily. 90 tablet 6  . FLUoxetine (PROZAC) 40 MG capsule Take 1 capsule (40 mg total) by mouth daily. 90 capsule 1  . fluticasone (FLONASE) 50 MCG/ACT nasal spray Place 2 sprays into both nostrils daily. 16  g 0  . Lubiprostone (AMITIZA PO) Take by mouth.    . meloxicam (MOBIC) 15 MG tablet Take one pill daily with food as needed for pain 30 tablet 3  . predniSONE (DELTASONE) 20 MG tablet 3 tabs po daily x 3 days, then 2 tabs x 3 days, then 1.5 tabs x 3 days, then 1 tab x 3 days, then 0.5 tabs x 3 days 27 tablet 0  . promethazine-dextromethorphan (PROMETHAZINE-DM) 6.25-15 MG/5ML syrup Take 5-10 ML PO q8hrs prn for cough 240 mL 1  . tretinoin (RETIN-A) 0.025 % gel Apply topically at bedtime. 45 g 1   No current facility-administered medications on file prior to visit.    Medical History:  Past Medical History:  Diagnosis Date  . Allergy   . Anxiety   . Barrett's esophagus   . Depression   . GERD (gastroesophageal reflux disease)   . History of bladder surgery   . Miscarriage    twice  . Peptic ulcer disease    Allergies:  Allergies  Allergen Reactions  . Brintellix [Vortioxetine] Nausea Only     Review of Systems:  Review of Systems  Constitutional: Negative.   HENT: Negative.   Eyes: Negative.   Respiratory: Negative.   Cardiovascular: Negative.   Gastrointestinal: Negative.   Genitourinary: Negative for dysuria, flank pain, frequency, hematuria and urgency.  Musculoskeletal: Negative.   Skin: Negative.   Neurological: Negative.   Endo/Heme/Allergies: Negative.   Psychiatric/Behavioral: Negative for depression, hallucinations, memory loss, substance abuse and suicidal ideas. The patient is nervous/anxious and has insomnia.     Family history- Review and unchanged Social history- Review and unchanged Physical Exam: There were no vitals taken for this visit. Wt Readings from Last 3 Encounters:  03/08/16 126 lb (57.2 kg)  09/12/15 132 lb 12.8 oz (60.2 kg)  06/07/15 125 lb 3.2 oz (56.8 kg)   General Appearance: Well nourished, in no apparent distress. Eyes: PERRLA, EOMs, conjunctiva no swelling or erythema Sinuses: No Frontal/maxillary tenderness ENT/Mouth: Ext aud  canals clear, TMs without erythema, bulging. No erythema, swelling, or exudate on post pharynx.  Tonsils not swollen or erythematous. Hearing normal.  Neck: Supple, thyroid normal.  Respiratory: Respiratory effort normal, BS equal bilaterally without rales, rhonchi, wheezing or stridor.  Cardio: RRR with no MRGs. Brisk peripheral pulses without edema.  Abdomen: Soft, + BS,  Non tender, no guarding, rebound, hernias, masses. Lymphatics: Non tender without lymphadenopathy.  Musculoskeletal: Full ROM, 5/5 strength, Normal gait Skin: Warm, dry without rashes, lesions, ecchymosis.  Neuro: Cranial nerves intact. Normal muscle tone, no cerebellar symptoms. Psych: Awake and oriented X 3, normal affect, Insight and Judgment appropriate.    Vicie Mutters, PA-C 10:17 AM [facility]

## 2016-03-13 NOTE — Patient Instructions (Signed)

## 2016-03-30 ENCOUNTER — Encounter: Payer: Self-pay | Admitting: *Deleted

## 2016-04-01 ENCOUNTER — Other Ambulatory Visit: Payer: Self-pay | Admitting: Internal Medicine

## 2016-04-01 ENCOUNTER — Other Ambulatory Visit: Payer: Self-pay | Admitting: Physician Assistant

## 2016-04-01 DIAGNOSIS — F419 Anxiety disorder, unspecified: Secondary | ICD-10-CM

## 2016-04-01 NOTE — Telephone Encounter (Signed)
Please call Diazepam 

## 2016-04-02 NOTE — Telephone Encounter (Signed)
Spoke with about her Rx for both xanax & valium. Pt states she will cont with the Xanax for now & if there is an issues with xanax she will call the office.

## 2016-04-22 ENCOUNTER — Other Ambulatory Visit: Payer: Self-pay | Admitting: Physician Assistant

## 2016-04-22 DIAGNOSIS — G44209 Tension-type headache, unspecified, not intractable: Secondary | ICD-10-CM

## 2016-04-22 NOTE — Telephone Encounter (Signed)
Fioricet was called into pharmacy @ 1:54pm.

## 2016-04-30 DIAGNOSIS — Z713 Dietary counseling and surveillance: Secondary | ICD-10-CM | POA: Diagnosis not present

## 2016-05-22 ENCOUNTER — Other Ambulatory Visit: Payer: Self-pay | Admitting: Internal Medicine

## 2016-06-13 ENCOUNTER — Other Ambulatory Visit: Payer: Self-pay | Admitting: Physician Assistant

## 2016-06-13 DIAGNOSIS — G44209 Tension-type headache, unspecified, not intractable: Secondary | ICD-10-CM

## 2016-06-15 NOTE — Telephone Encounter (Signed)
Please call Fioricet 

## 2016-06-18 NOTE — Telephone Encounter (Signed)
Fioricet was called into pharmacy @ 8:41 am on 27th March 2018 by DD.

## 2016-06-25 DIAGNOSIS — Z713 Dietary counseling and surveillance: Secondary | ICD-10-CM | POA: Diagnosis not present

## 2016-07-23 DIAGNOSIS — L03119 Cellulitis of unspecified part of limb: Secondary | ICD-10-CM | POA: Diagnosis not present

## 2016-07-31 ENCOUNTER — Other Ambulatory Visit: Payer: Self-pay | Admitting: Physician Assistant

## 2016-07-31 ENCOUNTER — Encounter: Payer: Self-pay | Admitting: Physician Assistant

## 2016-07-31 DIAGNOSIS — J42 Unspecified chronic bronchitis: Secondary | ICD-10-CM

## 2016-07-31 DIAGNOSIS — F325 Major depressive disorder, single episode, in full remission: Secondary | ICD-10-CM

## 2016-07-31 MED ORDER — AMPHETAMINE-DEXTROAMPHETAMINE 20 MG PO TABS: 10.0000 mg | ORAL_TABLET | Freq: Two times a day (BID) | ORAL | 0 refills | Status: DC

## 2016-07-31 MED ORDER — ALPRAZOLAM 0.5 MG PO TABS: 0.5000 mg | ORAL_TABLET | Freq: Three times a day (TID) | ORAL | 1 refills | Status: DC | PRN

## 2016-08-06 DIAGNOSIS — Z01419 Encounter for gynecological examination (general) (routine) without abnormal findings: Secondary | ICD-10-CM | POA: Diagnosis not present

## 2016-08-06 DIAGNOSIS — Z6821 Body mass index (BMI) 21.0-21.9, adult: Secondary | ICD-10-CM | POA: Diagnosis not present

## 2016-08-06 DIAGNOSIS — Z1231 Encounter for screening mammogram for malignant neoplasm of breast: Secondary | ICD-10-CM | POA: Diagnosis not present

## 2016-08-06 DIAGNOSIS — Z13820 Encounter for screening for osteoporosis: Secondary | ICD-10-CM | POA: Diagnosis not present

## 2016-08-27 ENCOUNTER — Other Ambulatory Visit: Payer: Self-pay | Admitting: Internal Medicine

## 2016-08-27 ENCOUNTER — Other Ambulatory Visit: Payer: Self-pay | Admitting: Physician Assistant

## 2016-08-27 DIAGNOSIS — J42 Unspecified chronic bronchitis: Secondary | ICD-10-CM

## 2016-08-27 DIAGNOSIS — G44209 Tension-type headache, unspecified, not intractable: Secondary | ICD-10-CM

## 2016-08-27 NOTE — Telephone Encounter (Signed)
FIORICET & XANAX WERE CALLED INTO PHARMACY ON 5TH June 2018 BY DD

## 2016-08-27 NOTE — Telephone Encounter (Signed)
XANAX CALLED INTO PHARMACY ON 5TH June 2018 @ 2PM BY DD

## 2016-08-27 NOTE — Telephone Encounter (Signed)
INFORMED PT OF INSTRUCTIONS ABOUT CUTTING BACK ON FIORICET, PT STATES SHE WILL CUT BACK ON THIS MED.

## 2016-09-04 DIAGNOSIS — Z713 Dietary counseling and surveillance: Secondary | ICD-10-CM | POA: Diagnosis not present

## 2016-09-11 NOTE — Progress Notes (Signed)
Complete Physical  Assessment and Plan:  Hyperlipidemia LDL goal <100 - check lipids, decrease fatty foods, increase activity.  - CBC with Differential/Platelet - BASIC METABOLIC PANEL WITH GFR - Hepatic function panel - Lipid panel   Vitamin D deficiency - VITAMIN D 25 Hydroxy (Vit-D Deficiency, Fractures)  Medication management - Magnesium  Barrett's esophagus with dysplasia -controlled, continue the same medications  Gastric ulcer, unspecified chronicity Continue PPI/H2 blocker, diet discussed  Gastroesophageal reflux disease with esophagitis Continue PPI/H2 blocker, diet discussed   Liver cyst Monitor/benign, pt reassured   Dysplastic nevus Continue fu derm   Depression, major, in remission (HCC) - TSH - amphetamine-dextroamphetamine (ADDERALL) 20 MG tablet; Take 0.5 tablets (10 mg total) by mouth 2 (two) times daily with a meal.  Dispense: 60 tablet; Refill: 0 - FLUoxetine (PROZAC) 40 MG capsule; Take 1 capsule (40 mg total) by mouth daily.  Dispense: 90 capsule; Refill: 1  Anxiety - TSH - diazepam (VALIUM) 5 MG tablet; 1/2-1 po BID for sleep/anxiety  Dispense: 60 tablet; Refill: 2   Nonintractable headache, unspecified chronicity pattern, unspecified headache type Continue meds PRN, get on allergy pill  Allergy, subsequent encounter - Allegra OTC, increase H20, allergy hygiene explained.   Routine general medical examination at a health care facility  Vaginal pain Declines pelvic at this time, check urine, if negative needs pelvic exam - Urine culture  Screening for blood or protein in urine - Urinalysis, Routine w reflex microscopic (not at [facility]) - Microalbumin / creatinine urine ratio  Acne, unspecified acne type - tretinoin (RETIN-A) 0.025 % gel; Apply topically at bedtime.  Dispense: 45 g; Refill: 1 -     Adapalene-Benzoyl Peroxide 0.1-2.5 % gel; Apply small amount at bed time as needed  Neck pain -     carisoprodol (SOMA) 350 MG tablet; Take one  tablet at night x 5-10 days -     amitriptyline (ELAVIL) 10 MG tablet; 1-2 pills at bed time for migraine -     Ambulatory referral to Physical Therapy  Nonintractable headache, unspecified chronicity pattern, unspecified headache type -     carisoprodol (SOMA) 350 MG tablet; Take one tablet at night x 5-10 days -     amitriptyline (ELAVIL) 10 MG tablet; 1-2 pills at bed time for migraine  Anemia, unspecified type -     Iron and TIBC -     Vitamin B12  Discussed med's effects and SE's. Screening labs and tests as requested with regular follow-up as recommended. Over 40 minutes of exam, counseling, chart review, and complex, high level critical decision making was performed this visit.   HPI  56 y.o. female  presents for a complete physical and follow up for depression, chol, vitamin D def, and Barrett's.   Her blood pressure has been controlled at home, today their BP is BP: 130/70 She does not workout, not working out at this time. She denies chest pain, shortness of breath, dizziness. She was having CP and dyspnea, had normal workup with Dr. Ashok Cordia, has resolved. Negative autoimmune work up.  Going to spend time with Claiborne Billings since she is having surgery for endometriosis, in [place].  She is not on cholesterol medication and denies myalgias. Her cholesterol is at goal. The cholesterol last visit was:   Lab Results  Component Value Date   CHOL 194 09/12/2015   HDL 88 09/12/2015   LDLCALC 80 09/12/2015   TRIG 132 09/12/2015   CHOLHDL 2.2 09/12/2015    Last A1C in the office was:  Lab Results  Component Value Date   HGBA1C 5.0 09/08/2013   Patient is on Vitamin D supplement.   Lab Results  Component Value Date   VD25OH 32 09/12/2015     She had some pseudodementia with normal testing after her grandmother passed, currently on prozac, adderall, and xanax PRN. Doing well. Some stress with her youngest daughter, Cyril Mourning and with lay offs at her job.  Has history of HA, states  recently having more increase in [place] and general pain from neck/shoulder and up.  She has history of dysplastic nevus, we are monitoring, follows Dr. Ubaldo Glassing.  She has had TAH and has osteopenia, on estrogen for osteopenia as well as hot flashes, on bASA.    Current Medications:  Current Outpatient Prescriptions on File Prior to Visit  Medication Sig Dispense Refill  . albuterol (PROVENTIL HFA;VENTOLIN HFA) 108 (90 Base) MCG/ACT inhaler Inhale 2 puffs into the lungs every 6 (six) hours as needed for wheezing or shortness of breath. 1 Inhaler 2  . ALPRAZolam (XANAX) 0.5 MG tablet take 1 tablet by mouth three times a day if needed for sleep or anxiety 60 tablet 1  . AMITIZA 24 MCG capsule Take 24 mcg by mouth daily.  0  . amphetamine-dextroamphetamine (ADDERALL) 20 MG tablet Take 0.5 tablets (10 mg total) by mouth 2 (two) times daily with a meal. 60 tablet 0  . buPROPion (WELLBUTRIN XL) 300 MG 24 hr tablet take 1 tablet by mouth every morning 90 tablet 0  . butalbital-acetaminophen-caffeine (FIORICET, ESGIC) 50-325-40 MG tablet take 1 tablet by mouth every 6 hours if needed for headache 30 tablet 0  . Cholecalciferol (VITAMIN D3) 5000 units CAPS Take by mouth.    . DEXILANT 60 MG capsule   0  . estradiol (ESTRACE) 2 MG tablet Take 0.5-1 tablets (1-2 mg total) by mouth daily. 90 tablet 6  . metoCLOPramide (REGLAN) 10 MG tablet Take 10 mg by mouth 4 (four) times daily.  0  . tretinoin (RETIN-A) 0.025 % gel Apply topically at bedtime. 45 g 1  . FLUoxetine (PROZAC) 40 MG capsule Take 1 capsule (40 mg total) by mouth daily. 90 capsule 1   No current facility-administered medications on file prior to visit.    Allergies:  Allergies  Allergen Reactions  . Brintellix [Vortioxetine] Nausea Only   Medical History:  Shehas Allergy; Anxiety; Depression, major, in remission ([place]); Gastric ulcer; Neck pain; Headache; Hyperlipidemia LDL goal <100; Dysplastic nevus; Vitamin D deficiency; Gastroesophageal  reflux disease with esophagitis; Barrett's esophagus; and Liver cyst on her problem list.   Health Maintenance:   Immunization History  Administered Date(s) Administered  . Influenza Split 12/25/2012, 12/20/2014  . Tdap 09/04/2012    Tetanus: 2014 Pneumovax: N/A Prevnar 13:  N/A Flu vaccine: 2016 Zostavax: N/A  LMP TAH Pap: 2015 Dr. Corinna Capra MGM: 04/2016 gets with Dr. Corinna Capra  DEXA: 2018 osteopenia Dr. Corinna Capra Colonoscopy: Aug 2016 with Dr. Cl[facility] [place], due 2019 EGD: 2017, + gastritis/ulcer still, was on reglan for 1 month CXR 2012 CT AB 2001 CTA chest negative 2017 PFTs 2017 normal, Dr. Ashok Cordia  Patient Care Team: Unk Pinto, MD as PCP - General (Internal Medicine) Donell Beers, MD as Referring Physician (Orthopedic Surgery) Melrose Nakayama, MD as Consulting Physician (Orthopedic Surgery)  Surgical History:  She  has a past surgical history that includes Abdominal hysterectomy; Cesarean section; Bladder suspension; and Tonsillectomy. Family History:  She had a family history includes Asthma in her paternal grandmother; COPD  in her maternal grandmother; Heart disease in her father and paternal grandfather; Hyperlipidemia in her father; Hypertension in her father and mother; Pulmonary embolism in her maternal grandmother. Social History:  She  reports that she is a non-smoker but has been exposed to tobacco smoke. She has never used smokeless tobacco. She reports that she drinks alcohol. She reports that she does not use drugs.  Review of Systems: Review of Systems  Constitutional: Negative.   HENT: Negative.   Eyes: Negative.   Respiratory: Negative.   Cardiovascular: Negative.   Gastrointestinal: Negative.   Genitourinary: Negative for dysuria, flank pain, frequency, hematuria and urgency.  Musculoskeletal: Negative.   Skin: Negative.   Neurological: Negative.   Endo/Heme/Allergies: Negative.   Psychiatric/Behavioral: Negative for depression,  hallucinations, memory loss, substance abuse and suicidal ideas. The patient is nervous/anxious and has insomnia.     Physical Exam: Estimated body mass index is 21.42 kg/m as calculated from the following:   Height as of this encounter: 5\' 4"  (1.626 m).   Weight as of this encounter: 124 lb 12.8 oz (56.6 kg). BP 130/70   Pulse 75   Temp 98.1 F (36.7 C)   Resp 14   Ht 5\' 4"  (1.626 m)   Wt 124 lb 12.8 oz (56.6 kg)   SpO2 95%   BMI 21.42 kg/m  General Appearance: Well nourished, in no apparent distress.  Eyes: PERRLA, EOMs, conjunctiva no swelling or erythema, normal fundi and vessels.  Sinuses: No Frontal/maxillary tenderness  ENT/Mouth: Ext aud canals clear, normal light reflex with TMs without erythema, bulging. Good dentition. No erythema, swelling, or exudate on post pharynx. Tonsils not swollen or erythematous. Hearing normal. + TMJ Neck: Supple, thyroid normal. No bruits  Respiratory: Respiratory effort normal, BS equal bilaterally without rales, rhonchi, wheezing or stridor.  Cardio: RRR without murmurs, rubs or gallops. Brisk peripheral pulses without edema.  Chest: symmetric, with normal excursions and percussion.  Breasts: defer  Abdomen: Soft, nontender, no guarding, rebound, hernias, masses, or organomegaly.  Lymphatics: Non tender without lymphadenopathy.  Genitourinary: defer Musculoskeletal: Full ROM all peripheral extremities,5/5 strength, and normal gait. + bilateral trap pain, full ROM, normal distal neuro Skin: Warm, dry without rashes, lesions, ecchymosis. Neuro: Cranial nerves intact, reflexes equal bilaterally. Normal muscle tone, no cerebellar symptoms. Sensation intact.  Psych: Awake and oriented X 3, normal affect, Insight and Judgment appropriate.   EKG: declines AORTA SCAN: declines   Vicie Mutters 12:41 PM [facility]

## 2016-09-12 ENCOUNTER — Other Ambulatory Visit: Payer: Self-pay | Admitting: Physician Assistant

## 2016-09-12 ENCOUNTER — Encounter: Payer: Self-pay | Admitting: Physician Assistant

## 2016-09-12 ENCOUNTER — Ambulatory Visit (INDEPENDENT_AMBULATORY_CARE_PROVIDER_SITE_OTHER): Payer: BLUE CROSS/BLUE SHIELD | Admitting: Physician Assistant

## 2016-09-12 VITALS — BP 130/70 | HR 75 | Temp 98.1°F | Resp 14 | Ht 64.0 in | Wt 124.8 lb

## 2016-09-12 DIAGNOSIS — D239 Other benign neoplasm of skin, unspecified: Secondary | ICD-10-CM

## 2016-09-12 DIAGNOSIS — D649 Anemia, unspecified: Secondary | ICD-10-CM

## 2016-09-12 DIAGNOSIS — K2100 Gastro-esophageal reflux disease with esophagitis, without bleeding: Secondary | ICD-10-CM

## 2016-09-12 DIAGNOSIS — R51 Headache: Secondary | ICD-10-CM

## 2016-09-12 DIAGNOSIS — Z79899 Other long term (current) drug therapy: Secondary | ICD-10-CM

## 2016-09-12 DIAGNOSIS — K259 Gastric ulcer, unspecified as acute or chronic, without hemorrhage or perforation: Secondary | ICD-10-CM | POA: Diagnosis not present

## 2016-09-12 DIAGNOSIS — E559 Vitamin D deficiency, unspecified: Secondary | ICD-10-CM

## 2016-09-12 DIAGNOSIS — Z0001 Encounter for general adult medical examination with abnormal findings: Secondary | ICD-10-CM

## 2016-09-12 DIAGNOSIS — K22719 Barrett's esophagus with dysplasia, unspecified: Secondary | ICD-10-CM | POA: Diagnosis not present

## 2016-09-12 DIAGNOSIS — Z1389 Encounter for screening for other disorder: Secondary | ICD-10-CM

## 2016-09-12 DIAGNOSIS — T7840XD Allergy, unspecified, subsequent encounter: Secondary | ICD-10-CM

## 2016-09-12 DIAGNOSIS — F325 Major depressive disorder, single episode, in full remission: Secondary | ICD-10-CM

## 2016-09-12 DIAGNOSIS — Z0000 Encounter for general adult medical examination without abnormal findings: Secondary | ICD-10-CM

## 2016-09-12 DIAGNOSIS — K7689 Other specified diseases of liver: Secondary | ICD-10-CM

## 2016-09-12 DIAGNOSIS — E785 Hyperlipidemia, unspecified: Secondary | ICD-10-CM

## 2016-09-12 DIAGNOSIS — F419 Anxiety disorder, unspecified: Secondary | ICD-10-CM

## 2016-09-12 DIAGNOSIS — R519 Headache, unspecified: Secondary | ICD-10-CM

## 2016-09-12 DIAGNOSIS — R6889 Other general symptoms and signs: Secondary | ICD-10-CM

## 2016-09-12 DIAGNOSIS — M542 Cervicalgia: Secondary | ICD-10-CM

## 2016-09-12 LAB — CBC WITH DIFFERENTIAL/PLATELET
BASOS ABS: 53 {cells}/uL (ref 0–200)
BASOS PCT: 1 %
EOS ABS: 159 {cells}/uL (ref 15–500)
Eosinophils Relative: 3 %
HEMATOCRIT: 37.9 % (ref 35.0–45.0)
Hemoglobin: 12.9 g/dL (ref 11.7–15.5)
Lymphocytes Relative: 37 %
Lymphs Abs: 1961 cells/uL (ref 850–3900)
MCH: 32.9 pg (ref 27.0–33.0)
MCHC: 34 g/dL (ref 32.0–36.0)
MCV: 96.7 fL (ref 80.0–100.0)
MONO ABS: 742 {cells}/uL (ref 200–950)
MPV: 9.2 fL (ref 7.5–12.5)
Monocytes Relative: 14 %
NEUTROS ABS: 2385 {cells}/uL (ref 1500–7800)
Neutrophils Relative %: 45 %
Platelets: 239 10*3/uL (ref 140–400)
RBC: 3.92 MIL/uL (ref 3.80–5.10)
RDW: 13.5 % (ref 11.0–15.0)
WBC: 5.3 10*3/uL (ref 3.8–10.8)

## 2016-09-12 LAB — TSH: TSH: 1.02 mIU/L

## 2016-09-12 MED ORDER — CARISOPRODOL 350 MG PO TABS: ORAL_TABLET | ORAL | 0 refills | Status: DC

## 2016-09-12 MED ORDER — AMITRIPTYLINE HCL 10 MG PO TABS: ORAL_TABLET | ORAL | 0 refills | Status: DC

## 2016-09-12 MED ORDER — ADAPALENE-BENZOYL PEROXIDE 0.1-2.5 % EX GEL: CUTANEOUS | 0 refills | Status: DC

## 2016-09-12 NOTE — Patient Instructions (Signed)
What is the TMJ? The temporomandibular (tem-PUH-ro-man-DIB-yoo-ler) joint, or the TMJ, connects the upper and lower jawbones. This joint allows the jaw to open wide and move back and forth when you chew, talk, or yawn.There are also several muscles that help this joint move. There can be muscle tightness and pain in the muscle that can cause several symptoms.  What causes TMJ pain? There are many causes of TMJ pain. Repeated chewing (for example, chewing gum) and clenching your teeth can cause pain in the joint. Some TMJ pain has no obvious cause. What can I do to ease the pain? There are many things you can do to help your pain get better. When you have pain:  Eat soft foods and stay away from chewy foods (for example, taffy) Try to use both sides of your mouth to chew Don't chew gum Massage Don't open your mouth wide (for example, during yawning or singing) Don't bite your cheeks or fingernails Lower your amount of stress and worry Applying a warm, damp washcloth to the joint may help. Over-the-counter pain medicines such as ibuprofen (one brand: Advil) or acetaminophen (one brand: Tylenol) might also help. Do not use these medicines if you are allergic to them or if your doctor told you not to use them. How can I stop the pain from coming back? When your pain is better, you can do these exercises to make your muscles stronger and to keep the pain from coming back:  Resisted mouth opening: Place your thumb or two fingers under your chin and open your mouth slowly, pushing up lightly on your chin with your thumb. Hold for three to six seconds. Close your mouth slowly. Resisted mouth closing: Place your thumbs under your chin and your two index fingers on the ridge between your mouth and the bottom of your chin. Push down lightly on your chin as you close your mouth. Tongue up: Slowly open and close your mouth while keeping the tongue touching the roof of the mouth. Side-to-side jaw movement:  Place an object about one fourth of an inch thick (for example, two tongue depressors) between your front teeth. Slowly move your jaw from side to side. Increase the thickness of the object as the exercise becomes easier Forward jaw movement: Place an object about one fourth of an inch thick between your front teeth and move the bottom jaw forward so that the bottom teeth are in front of the top teeth. Increase the thickness of the object as the exercise becomes easier. These exercises should not be painful. If it hurts to do these exercises, stop doing them and talk to your family doctor.    Generalized Anxiety Disorder, Adult Generalized anxiety disorder (GAD) is a mental health disorder. People with this condition constantly worry about everyday events. Unlike normal anxiety, worry related to GAD is not triggered by a specific event. These worries also do not fade or get better with time. GAD interferes with life functions, including relationships, work, and school. GAD can vary from mild to severe. People with severe GAD can have intense waves of anxiety with physical symptoms (panic attacks). What are the causes? The exact cause of GAD is not known. What increases the risk? This condition is more likely to develop in:  Women.  People who have a family history of anxiety disorders.  People who are very shy.  People who experience very stressful life events, such as the death of a loved one.  People who have a very stressful  family environment.  What are the signs or symptoms? People with GAD often worry excessively about many things in their lives, such as their health and family. They may also be overly concerned about:  Doing well at work.  Being on time.  Natural disasters.  Friendships.  Physical symptoms of GAD include:  Fatigue.  Muscle tension or having muscle twitches.  Trembling or feeling shaky.  Being easily startled.  Feeling like your heart is pounding or  racing.  Feeling out of breath or like you cannot take a deep breath.  Having trouble falling asleep or staying asleep.  Sweating.  Nausea, diarrhea, or irritable bowel syndrome (IBS).  Headaches.  Trouble concentrating or remembering facts.  Restlessness.  Irritability.  How is this diagnosed? Your health care provider can diagnose GAD based on your symptoms and medical history. You will also have a physical exam. The health care provider will ask specific questions about your symptoms, including how severe they are, when they started, and if they come and go. Your health care provider may ask you about your use of alcohol or drugs, including prescription medicines. Your health care provider may refer you to a mental health specialist for further evaluation. Your health care provider will do a thorough examination and may perform additional tests to rule out other possible causes of your symptoms. To be diagnosed with GAD, a person must have anxiety that:  Is out of his or her control.  Affects several different aspects of his or her life, such as work and relationships.  Causes distress that makes him or her unable to take part in normal activities.  Includes at least three physical symptoms of GAD, such as restlessness, fatigue, trouble concentrating, irritability, muscle tension, or sleep problems.  Before your health care provider can confirm a diagnosis of GAD, these symptoms must be present more days than they are not, and they must last for six months or longer. How is this treated? The following therapies are usually used to treat GAD:  Medicine. Antidepressant medicine is usually prescribed for long-term daily control. Antianxiety medicines may be added in severe cases, especially when panic attacks occur.  Talk therapy (psychotherapy). Certain types of talk therapy can be helpful in treating GAD by providing support, education, and guidance. Options  include: ? Cognitive behavioral therapy (CBT). People learn coping skills and techniques to ease their anxiety. They learn to identify unrealistic or negative thoughts and behaviors and to replace them with positive ones. ? Acceptance and commitment therapy (ACT). This treatment teaches people how to be mindful as a way to cope with unwanted thoughts and feelings. ? Biofeedback. This process trains you to manage your body's response (physiological response) through breathing techniques and relaxation methods. You will work with a therapist while machines are used to monitor your physical symptoms.  Stress management techniques. These include yoga, meditation, and exercise.  A mental health specialist can help determine which treatment is best for you. Some people see improvement with one type of therapy. However, other people require a combination of therapies. Follow these instructions at home:  Take over-the-counter and prescription medicines only as told by your health care provider.  Try to maintain a normal routine.  Try to anticipate stressful situations and allow extra time to manage them.  Practice any stress management or self-calming techniques as taught by your health care provider.  Do not punish yourself for setbacks or for not making progress.  Try to recognize your accomplishments, even if they  are small.  Keep all follow-up visits as told by your health care provider. This is important. Contact a health care provider if:  Your symptoms do not get better.  Your symptoms get worse.  You have signs of depression, such as: ? A persistently sad, cranky, or irritable mood. ? Loss of enjoyment in activities that used to bring you joy. ? Change in weight or eating. ? Changes in sleeping habits. ? Avoiding friends or family members. ? Loss of energy for normal tasks. ? Feelings of guilt or worthlessness. Get help right away if:  You have serious thoughts about hurting  yourself or others. If you ever feel like you may hurt yourself or others, or have thoughts about taking your own life, get help right away. You can go to your nearest emergency department or call:  Your local emergency services (911 in the [place]  A suicide crisis helpline, such as the [facility] at 2056825904. This is open 24 hours a day.  Summary  Generalized anxiety disorder (GAD) is a mental health disorder that involves worry that is not triggered by a specific event.  People with GAD often worry excessively about many things in their lives, such as their health and family.  GAD may cause physical symptoms such as restlessness, trouble concentrating, sleep problems, frequent sweating, nausea, diarrhea, headaches, and trembling or muscle twitching.  A mental health specialist can help determine which treatment is best for you. Some people see improvement with one type of therapy. However, other people require a combination of therapies. This information is not intended to replace advice given to you by your health care provider. Make sure you discuss any questions you have with your health care provider. Document Released: 07/06/2012 Document Revised: 01/30/2016 Document Reviewed: 01/30/2016 Elsevier Interactive Patient Education  [employer].

## 2016-09-13 LAB — BASIC METABOLIC PANEL WITH GFR
BUN: 13 mg/dL (ref 7–25)
CO2: 26 mmol/L (ref 20–31)
CREATININE: 0.76 mg/dL (ref 0.50–1.05)
Calcium: 9.4 mg/dL (ref 8.6–10.4)
Chloride: 101 mmol/L (ref 98–110)
GFR, Est African American: >89 mL/min (ref >=60)
GFR, Est Non African American: 89 mL/min (ref >=60)
Glucose, Bld: 74 mg/dL (ref 65–99)
POTASSIUM: 3.9 mmol/L (ref 3.5–5.3)
Sodium: 138 mmol/L (ref 135–146)

## 2016-09-13 LAB — HEPATIC FUNCTION PANEL
ALBUMIN: 4.2 g/dL (ref 3.6–5.1)
ALK PHOS: 62 U/L (ref 33–130)
ALT: 13 U/L (ref 6–29)
AST: 18 U/L (ref 10–35)
Bilirubin, Direct: 0.1 mg/dL (ref <=0.2)
Indirect Bilirubin: 0.4 mg/dL (ref 0.2–1.2)
TOTAL PROTEIN: 6.9 g/dL (ref 6.1–8.1)
Total Bilirubin: 0.5 mg/dL (ref 0.2–1.2)

## 2016-09-13 LAB — URINALYSIS, ROUTINE W REFLEX MICROSCOPIC
Bilirubin Urine: NEGATIVE
Glucose, UA: NEGATIVE
HGB URINE DIPSTICK: NEGATIVE
Leukocytes, UA: NEGATIVE
NITRITE: NEGATIVE
PH: 6 (ref 5.0–8.0)
Specific Gravity, Urine: 1.037 — ABNORMAL HIGH (ref 1.001–1.035)

## 2016-09-13 LAB — IRON AND TIBC
%SAT: 56 % — ABNORMAL HIGH (ref 11–50)
Iron: 161 ug/dL — ABNORMAL HIGH (ref 45–160)
TIBC: 288 ug/dL (ref 250–450)
UIBC: 127 ug/dL

## 2016-09-13 LAB — VITAMIN D 25 HYDROXY (VIT D DEFICIENCY, FRACTURES): VIT D 25 HYDROXY: 34 ng/mL (ref 30–100)

## 2016-09-13 LAB — VITAMIN B12: Vitamin B-12: 342 pg/mL (ref 200–1100)

## 2016-09-13 LAB — MICROALBUMIN / CREATININE URINE RATIO
Creatinine, Urine: 420 mg/dL — ABNORMAL HIGH (ref 20–320)
MICROALB UR: 6.8 mg/dL
Microalb Creat Ratio: 16 mcg/mg creat (ref <30)

## 2016-09-13 LAB — LIPID PANEL
Cholesterol: 233 mg/dL — ABNORMAL HIGH (ref <200)
HDL: 84 mg/dL (ref >50)
LDL Cholesterol: 136 mg/dL — ABNORMAL HIGH (ref <100)
Total CHOL/HDL Ratio: 2.8 Ratio (ref <5.0)
Triglycerides: 64 mg/dL (ref <150)
VLDL: 13 mg/dL (ref <30)

## 2016-09-13 LAB — MAGNESIUM: MAGNESIUM: 1.9 mg/dL (ref 1.5–2.5)

## 2016-09-14 LAB — FERRITIN: FERRITIN: 34 ng/mL (ref 10–232)

## 2016-09-17 LAB — URINE CULTURE

## 2016-11-06 DIAGNOSIS — Z713 Dietary counseling and surveillance: Secondary | ICD-10-CM | POA: Diagnosis not present

## 2016-12-03 ENCOUNTER — Other Ambulatory Visit: Payer: Self-pay | Admitting: Physician Assistant

## 2016-12-03 ENCOUNTER — Other Ambulatory Visit: Payer: Self-pay | Admitting: Internal Medicine

## 2016-12-03 DIAGNOSIS — J42 Unspecified chronic bronchitis: Secondary | ICD-10-CM

## 2016-12-03 DIAGNOSIS — F325 Major depressive disorder, single episode, in full remission: Secondary | ICD-10-CM

## 2016-12-03 DIAGNOSIS — G44209 Tension-type headache, unspecified, not intractable: Secondary | ICD-10-CM

## 2016-12-04 MED ORDER — BUTALBITAL-APAP-CAFFEINE 50-325-40 MG PO TABS: 1.0000 | ORAL_TABLET | Freq: Four times a day (QID) | ORAL | 0 refills | Status: DC | PRN

## 2016-12-04 MED ORDER — AMPHETAMINE-DEXTROAMPHETAMINE 20 MG PO TABS: 10.0000 mg | ORAL_TABLET | Freq: Two times a day (BID) | ORAL | 0 refills | Status: DC

## 2016-12-04 MED ORDER — BUPROPION HCL ER (XL) 150 MG PO TB24: 150.0000 mg | ORAL_TABLET | Freq: Every morning | ORAL | 1 refills | Status: DC

## 2016-12-04 MED ORDER — FLUOXETINE HCL 20 MG PO CAPS: 20.0000 mg | ORAL_CAPSULE | Freq: Every day | ORAL | 1 refills | Status: DC

## 2016-12-19 DIAGNOSIS — M542 Cervicalgia: Secondary | ICD-10-CM | POA: Diagnosis not present

## 2016-12-25 DIAGNOSIS — M542 Cervicalgia: Secondary | ICD-10-CM | POA: Diagnosis not present

## 2016-12-25 DIAGNOSIS — M5030 Other cervical disc degeneration, unspecified cervical region: Secondary | ICD-10-CM | POA: Diagnosis not present

## 2017-01-01 DIAGNOSIS — M542 Cervicalgia: Secondary | ICD-10-CM | POA: Diagnosis not present

## 2017-01-01 DIAGNOSIS — M5030 Other cervical disc degeneration, unspecified cervical region: Secondary | ICD-10-CM | POA: Diagnosis not present

## 2017-01-08 DIAGNOSIS — M542 Cervicalgia: Secondary | ICD-10-CM | POA: Diagnosis not present

## 2017-01-08 DIAGNOSIS — M5030 Other cervical disc degeneration, unspecified cervical region: Secondary | ICD-10-CM | POA: Diagnosis not present

## 2017-01-15 DIAGNOSIS — M5030 Other cervical disc degeneration, unspecified cervical region: Secondary | ICD-10-CM | POA: Diagnosis not present

## 2017-01-15 DIAGNOSIS — M542 Cervicalgia: Secondary | ICD-10-CM | POA: Diagnosis not present

## 2017-01-22 DIAGNOSIS — M542 Cervicalgia: Secondary | ICD-10-CM | POA: Diagnosis not present

## 2017-01-22 DIAGNOSIS — Z23 Encounter for immunization: Secondary | ICD-10-CM | POA: Diagnosis not present

## 2017-01-22 DIAGNOSIS — M5030 Other cervical disc degeneration, unspecified cervical region: Secondary | ICD-10-CM | POA: Diagnosis not present

## 2017-03-03 ENCOUNTER — Ambulatory Visit: Payer: Self-pay | Admitting: Adult Health

## 2017-03-04 NOTE — Progress Notes (Signed)
FOLLOW UP  Assessment and Plan:   Cholesterol Not currently on medication; patient reports significant dietary changes -  Anticipate improvement Continue low cholesterol diet and exercise.  Check lipid panel.   Major Depression currently in remission/anxiety Continue medications - discussed risks of daily benzo use, recommended regular medication vacations and minimal necessary use Lifestyle discussed: diet/exerise, sleep hygiene, stress management, hydration  GERD/Barrett's esophagus Well managed on current medications - dexilant 60 mg daily - Followed by Dr. Ferdinand Lango  Discussed diet, avoiding triggers and other lifestyle changes  Vitamin D Def/ osteoporosis prevention Continue supplementation Check Vit D level  Cervical degenerative disc disease Dry needling helped associated trapezius tension/spasms - muscle relaxers were not helpful Interfering with sleep but patient declining further interventions at this time - patient may call back should she change her mind Follow up with Dr. Rip Harbour -   Continue diet and meds as discussed. Further disposition pending results of labs. Discussed med's effects and SE's.   Over 30 minutes of exam, counseling, chart review, and critical decision making was performed.   Future Appointments  Date Time Provider [place]  09/15/2017  9:00 AM Vicie Mutters, PA-C [id number] None    ----------------------------------------------------------------------------------------------------------------------  HPI 56 y.o. female  presents for 6 month follow up and medication management for severe GERD with Barrett's esphagitis, hyperlipidemia with goal LDL <100, major depression in remission, anxiety, vitamin D deficiency. She has ongoing neck pain for which she has been seeing Dr. 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 She is following up this week. Symptoms do significantly interfere with sleep quality - has tried several medications including amitriptyline, Soma - she reported she did not like SE - stopped taking. Alternatives discussed today- declines.   BMI is Body mass index is 21.97 kg/m., she has been working on diet and exercise. Wt Readings from Last 3 Encounters:  03/05/17 128 lb (58.1 kg)  09/12/16 124 lb 12.8 oz (56.6 kg)  03/13/16 131 lb 6.4 oz (59.6 kg)    Today their BP is BP: 112/74   she has a diagnosis of Major depression in remission with anxiety and is currently on wellbutrin 150 mg XR, xanax 0.5 mg TID PRN - has needed xanax nightly but only uses occasionally during day - has needed more lately due to increased stress at work, reports symptoms are well controlled on current regimen. She was previously on prozac 40 mg for several years after death of family member but requested to taper off at last vist - she reports she is doing well.   she has a diagnosis of GERD/Barrett's which is currently managed by dexilant 60 mg daily  -  she reports symptoms is currently well controlled, and denies breakthrough reflux, burning in chest, hoarseness or cough.  Followed by Dr. Ferdinand Lango at [facility] - will have repeat endoscopy early next year due to dx of Barrett's esophagus.   She is not on cholesterol medication and denies myalgias- she is following a nutritarian diet for cholesterol for past few months and anticipates significant cholesterol improvement.  Her cholesterol is not at goal at the last visit.  The cholesterol last visit was:   Lab Results  Component Value Date   CHOL 233 (H) 09/12/2016   HDL 84 09/12/2016   LDLCALC 136 (H) 09/12/2016  TRIG 64 09/12/2016   CHOLHDL 2.8 09/12/2016   Patient is on Vitamin D supplement but was below goal at the last visit:    Lab Results  Component Value Date    VD25OH 34 09/12/2016      Current Medications:  Current Outpatient Medications on File Prior to Visit  Medication Sig  . albuterol (PROVENTIL HFA;VENTOLIN HFA) 108 (90 Base) MCG/ACT inhaler Inhale 2 puffs into the lungs every 6 (six) hours as needed for wheezing or shortness of breath.  . AMITIZA 24 MCG capsule Take 24 mcg by mouth daily.  . Cholecalciferol (VITAMIN D3) 5000 units CAPS Take by mouth.  . DEXILANT 60 MG capsule   . estradiol (ESTRACE) 2 MG tablet Take 0.5-1 tablets (1-2 mg total) by mouth daily.  Marland Kitchen tretinoin (RETIN-A) 0.025 % gel Apply topically at bedtime.  . Adapalene-Benzoyl Peroxide 0.1-2.5 % gel Apply small amount at bed time as needed (Patient not taking: Reported on 03/05/2017)   No current facility-administered medications on file prior to visit.      Allergies:  Allergies  Allergen Reactions  . Brintellix [Vortioxetine] Nausea Only     Medical History:  Past Medical History:  Diagnosis Date  . Allergy   . Anxiety   . Barrett's esophagus   . Depression   . GERD (gastroesophageal reflux disease)   . History of bladder surgery   . Miscarriage    twice  . Peptic ulcer disease    Family history- Reviewed and unchanged Social history- Reviewed and unchanged   Review of Systems:  Review of Systems  Constitutional: Negative for malaise/fatigue and weight loss.  HENT: Negative for hearing loss and tinnitus.   Eyes: Negative for blurred vision and double vision.  Respiratory: Negative for cough, shortness of breath and wheezing.   Cardiovascular: Negative for chest pain, palpitations, orthopnea, claudication and leg swelling.  Gastrointestinal: Negative for abdominal pain, blood in stool, constipation, diarrhea, heartburn, melena, nausea and vomiting.  Genitourinary: Negative.   Musculoskeletal: Positive for neck pain. Negative for joint pain and myalgias.  Skin: Negative for rash.  Neurological: Negative for dizziness, tingling, sensory change,  weakness and headaches.  Endo/Heme/Allergies: Negative for polydipsia.  Psychiatric/Behavioral: Negative for depression. The patient is nervous/anxious (Currently high stress related to work and neck pain) and has insomnia (Related to neck pain).   All other systems reviewed and are negative.    Physical Exam: BP 112/74   Pulse 100   Temp (!) 97.5 F (36.4 C)   Ht 5\' 4"  (1.626 m)   Wt 128 lb (58.1 kg)   SpO2 97%   BMI 21.97 kg/m  Wt Readings from Last 3 Encounters:  03/05/17 128 lb (58.1 kg)  09/12/16 124 lb 12.8 oz (56.6 kg)  03/13/16 131 lb 6.4 oz (59.6 kg)   General Appearance: Well nourished, in no apparent distress. Eyes: PERRLA, EOMs, conjunctiva no swelling or erythema Sinuses: No Frontal/maxillary tenderness ENT/Mouth: Ext aud canals clear, TMs without erythema, bulging. No erythema, swelling, or exudate on post pharynx.  Tonsils not swollen or erythematous. Hearing normal.  Neck: Supple, thyroid normal.  Respiratory: Respiratory effort normal, BS equal bilaterally without rales, rhonchi, wheezing or stridor.  Cardio: RRR with no MRGs. Brisk peripheral pulses without edema.  Abdomen: Soft, + BS.  Non tender, no guarding, rebound, hernias, masses. Lymphatics: Non tender without lymphadenopathy.  Musculoskeletal: Full ROM, 5/5 strength, Normal gait. No cervical point/bony tenderness, some tension through bilateral traps Skin: Warm, dry without rashes, lesions, ecchymosis.  Neuro:  Cranial nerves intact. No cerebellar symptoms.  Psych: Awake and oriented X 3, normal affect, Insight and Judgment appropriate.    [name], NP 12:14 PM [facility]

## 2017-03-05 ENCOUNTER — Ambulatory Visit: Payer: BLUE CROSS/BLUE SHIELD | Admitting: Adult Health

## 2017-03-05 ENCOUNTER — Encounter: Payer: Self-pay | Admitting: Adult Health

## 2017-03-05 VITALS — BP 112/74 | HR 100 | Temp 97.5°F | Ht 64.0 in | Wt 128.0 lb

## 2017-03-05 DIAGNOSIS — Z79899 Other long term (current) drug therapy: Secondary | ICD-10-CM | POA: Diagnosis not present

## 2017-03-05 DIAGNOSIS — K2100 Gastro-esophageal reflux disease with esophagitis, without bleeding: Secondary | ICD-10-CM

## 2017-03-05 DIAGNOSIS — E559 Vitamin D deficiency, unspecified: Secondary | ICD-10-CM | POA: Diagnosis not present

## 2017-03-05 DIAGNOSIS — G44209 Tension-type headache, unspecified, not intractable: Secondary | ICD-10-CM

## 2017-03-05 DIAGNOSIS — F325 Major depressive disorder, single episode, in full remission: Secondary | ICD-10-CM | POA: Diagnosis not present

## 2017-03-05 DIAGNOSIS — F419 Anxiety disorder, unspecified: Secondary | ICD-10-CM | POA: Diagnosis not present

## 2017-03-05 DIAGNOSIS — E785 Hyperlipidemia, unspecified: Secondary | ICD-10-CM

## 2017-03-05 DIAGNOSIS — M5030 Other cervical disc degeneration, unspecified cervical region: Secondary | ICD-10-CM | POA: Diagnosis not present

## 2017-03-05 DIAGNOSIS — J42 Unspecified chronic bronchitis: Secondary | ICD-10-CM

## 2017-03-05 MED ORDER — AMPHETAMINE-DEXTROAMPHETAMINE 20 MG PO TABS: 10.0000 mg | ORAL_TABLET | Freq: Two times a day (BID) | ORAL | 0 refills | Status: DC

## 2017-03-05 MED ORDER — BUTALBITAL-APAP-CAFFEINE 50-325-40 MG PO TABS: 1.0000 | ORAL_TABLET | Freq: Four times a day (QID) | ORAL | 0 refills | Status: DC | PRN

## 2017-03-05 MED ORDER — BUPROPION HCL ER (XL) 150 MG PO TB24: 150.0000 mg | ORAL_TABLET | Freq: Every morning | ORAL | 1 refills | Status: DC

## 2017-03-05 MED ORDER — ALPRAZOLAM 0.5 MG PO TABS: ORAL_TABLET | ORAL | 1 refills | Status: DC

## 2017-03-05 NOTE — Patient Instructions (Addendum)
Be patient with weight loss - your BMI/weight is in normal range - BMI 21 - this may be why you are not losing much weight. May need to focus more on toning/muscle distribution - may consider getting a fat monitor - handheld versions available off of [employer] that are reasonably accurate within a few % points.   Be sure to drink plenty of water - 80-100+ fluid ounces daily  Here is some information to help you keep your heart healthy: Move it! - Aim for 30 mins of activity every day. Take it slowly at first. Talk to [place] before starting any new exercise program.   Lose it.  -Body Mass Index (BMI) can indicate if you need to lose weight. A healthy range is 18.5-24.9. For a BMI calculator, go to [employer]  Waist Management -Excess abdominal fat is a risk factor for heart disease, diabetes, asthma, stroke and more. Ideal waist circumference is less than 35" for women and less than 40" for men.   Eat Right -focus on fruits, vegetables, whole grains, and meals you make yourself. Avoid foods with trans fat and high sugar/sodium content.   Snooze or Snore? - Loud snoring can be a sign of sleep apnea, a significant risk factor for high blood pressure, heart attach, stroke, and heart arrhythmias.  Kick the habit -Quit Smoking! Avoid second hand smoke. A single cigarette raises your blood pressure for 20 mins and increases the risk of heart attack and stroke for the next 24 hours.   Are Aspirin and Supplements right for you? -Add ENTERIC COATED low dose 81 mg Aspirin daily OR can do every other day if you have easy bruising to protect your heart and head. As well as to reduce risk of Colon Cancer by 20 %, Skin Cancer by 26 % , Melanoma by 46% and Pancreatic cancer by 60%  Say "No to Stress -There may be little you can do about problems that cause stress. However, techniques such as long walks, meditation, and exercise can help you manage it.   Start Now! - Make changes one at a time and set  reasonable goals to increase your likelihood of success.

## 2017-03-06 LAB — CBC WITH DIFFERENTIAL/PLATELET
BASOS ABS: 49 {cells}/uL (ref 0–200)
Basophils Relative: 0.9 %
EOS ABS: 221 {cells}/uL (ref 15–500)
Eosinophils Relative: 4.1 %
HEMATOCRIT: 36 % (ref 35.0–45.0)
Hemoglobin: 12.4 g/dL (ref 11.7–15.5)
LYMPHS ABS: 2192 {cells}/uL (ref 850–3900)
MCH: 33.4 pg — AB (ref 27.0–33.0)
MCHC: 34.4 g/dL (ref 32.0–36.0)
MCV: 97 fL (ref 80.0–100.0)
MPV: 9.9 fL (ref 7.5–12.5)
Monocytes Relative: 9.3 %
NEUTROS PCT: 45.1 %
Neutro Abs: 2435 cells/uL (ref 1500–7800)
PLATELETS: 256 10*3/uL (ref 140–400)
RBC: 3.71 10*6/uL — ABNORMAL LOW (ref 3.80–5.10)
RDW: 12.1 % (ref 11.0–15.0)
TOTAL LYMPHOCYTE: 40.6 %
WBC: 5.4 10*3/uL (ref 3.8–10.8)
WBCMIX: 502 {cells}/uL (ref 200–950)

## 2017-03-06 LAB — LIPID PANEL
CHOL/HDL RATIO: 3 (calc) (ref <5.0)
Cholesterol: 219 mg/dL — ABNORMAL HIGH (ref <200)
HDL: 73 mg/dL (ref >50)
LDL CHOLESTEROL (CALC): 128 mg/dL — AB
NON-HDL CHOLESTEROL (CALC): 146 mg/dL — AB (ref <130)
TRIGLYCERIDES: 79 mg/dL (ref <150)

## 2017-03-06 LAB — BASIC METABOLIC PANEL WITH GFR
BUN: 10 mg/dL (ref 7–25)
CALCIUM: 9.3 mg/dL (ref 8.6–10.4)
CHLORIDE: 104 mmol/L (ref 98–110)
CO2: 30 mmol/L (ref 20–32)
Creat: 0.68 mg/dL (ref 0.50–1.05)
GFR, EST AFRICAN AMERICAN: 115 mL/min/{1.73_m2} (ref >=60)
GFR, EST NON AFRICAN AMERICAN: 99 mL/min/{1.73_m2} (ref >=60)
Glucose, Bld: 77 mg/dL (ref 65–99)
POTASSIUM: 4.3 mmol/L (ref 3.5–5.3)
SODIUM: 141 mmol/L (ref 135–146)

## 2017-03-06 LAB — HEPATIC FUNCTION PANEL
AG RATIO: 1.7 (calc) (ref 1.0–2.5)
ALBUMIN MSPROF: 4.2 g/dL (ref 3.6–5.1)
ALT: 11 U/L (ref 6–29)
AST: 16 U/L (ref 10–35)
Alkaline phosphatase (APISO): 59 U/L (ref 33–130)
BILIRUBIN DIRECT: 0.1 mg/dL (ref 0.0–0.2)
BILIRUBIN TOTAL: 0.4 mg/dL (ref 0.2–1.2)
Globulin: 2.5 g/dL (calc) (ref 1.9–3.7)
Indirect Bilirubin: 0.3 mg/dL (calc) (ref 0.2–1.2)
Total Protein: 6.7 g/dL (ref 6.1–8.1)

## 2017-03-06 LAB — TSH: TSH: 1.55 mIU/L

## 2017-03-06 LAB — VITAMIN D 25 HYDROXY (VIT D DEFICIENCY, FRACTURES): VIT D 25 HYDROXY: 33 ng/mL (ref 30–100)

## 2017-03-06 LAB — MAGNESIUM: Magnesium: 1.9 mg/dL (ref 1.5–2.5)

## 2017-03-07 DIAGNOSIS — M5090 Cervical disc disorder, unspecified, unspecified cervical region: Secondary | ICD-10-CM | POA: Diagnosis not present

## 2017-03-07 DIAGNOSIS — M542 Cervicalgia: Secondary | ICD-10-CM | POA: Diagnosis not present

## 2017-03-09 ENCOUNTER — Other Ambulatory Visit: Payer: Self-pay | Admitting: Physician Assistant

## 2017-03-19 ENCOUNTER — Ambulatory Visit: Payer: Self-pay | Admitting: Physician Assistant

## 2017-04-05 DIAGNOSIS — M542 Cervicalgia: Secondary | ICD-10-CM | POA: Diagnosis not present

## 2017-04-11 DIAGNOSIS — M542 Cervicalgia: Secondary | ICD-10-CM | POA: Diagnosis not present

## 2017-04-18 DIAGNOSIS — M5412 Radiculopathy, cervical region: Secondary | ICD-10-CM | POA: Diagnosis not present

## 2017-04-22 DIAGNOSIS — M542 Cervicalgia: Secondary | ICD-10-CM | POA: Diagnosis not present

## 2017-04-29 DIAGNOSIS — M542 Cervicalgia: Secondary | ICD-10-CM | POA: Diagnosis not present

## 2017-05-06 DIAGNOSIS — M5412 Radiculopathy, cervical region: Secondary | ICD-10-CM | POA: Diagnosis not present

## 2017-05-13 DIAGNOSIS — M542 Cervicalgia: Secondary | ICD-10-CM | POA: Diagnosis not present

## 2017-05-21 DIAGNOSIS — Z713 Dietary counseling and surveillance: Secondary | ICD-10-CM | POA: Diagnosis not present

## 2017-05-22 DIAGNOSIS — M5412 Radiculopathy, cervical region: Secondary | ICD-10-CM | POA: Diagnosis not present

## 2017-05-30 IMAGING — CT CT ANGIO CHEST
2 of 6 series · 19 of 36 positions shown · IV contrast (OMNIPAQUE)
Comparison: Chest radiograph, 08/29/2010

CLINICAL DATA: Chest pain that extends through to the back of the
shoulder blades. History of gastroesophageal reflux disease and
Barrett's esophagus. There is a pleuritic component to the chest
pain. Denies cough. No trauma. No dyspnea.

EXAM:
CT ANGIOGRAPHY CHEST WITH CONTRAST
TECHNIQUE: Multidetector CT imaging of the chest was performed using the
standard protocol during bolus administration of intravenous
contrast. Multiplanar CT image reconstructions and MIPs were
obtained to evaluate the vascular anatomy.
CONTRAST:  100mL OMNIPAQUE IOHEXOL 350 MG/ML SOLN

[Series 7: pe thins @ 1mm · axial · 0.62mm/px · z∈[-338,-36]mm · 18 of 336 slices shown]
[im 17/336  lung]
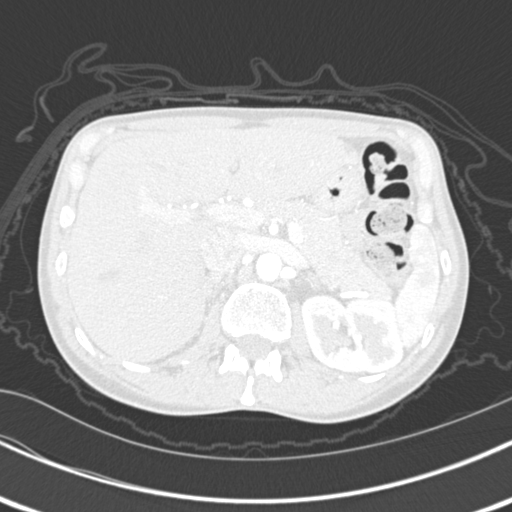
[im 34/336  mediastinal]
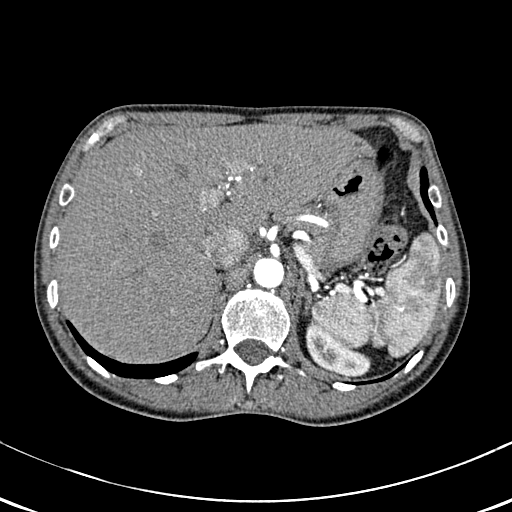
[im 51/336  lung]
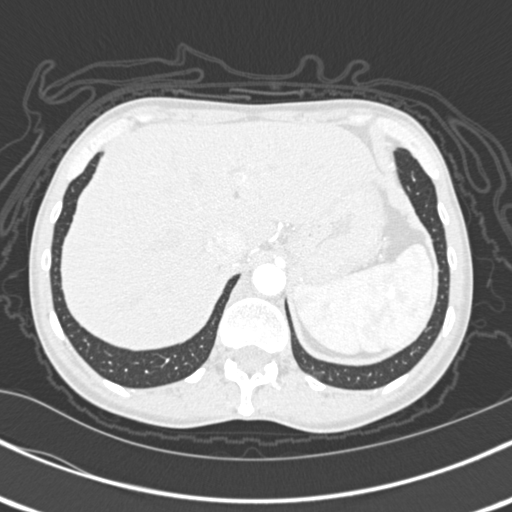
[im 68/336  mediastinal]
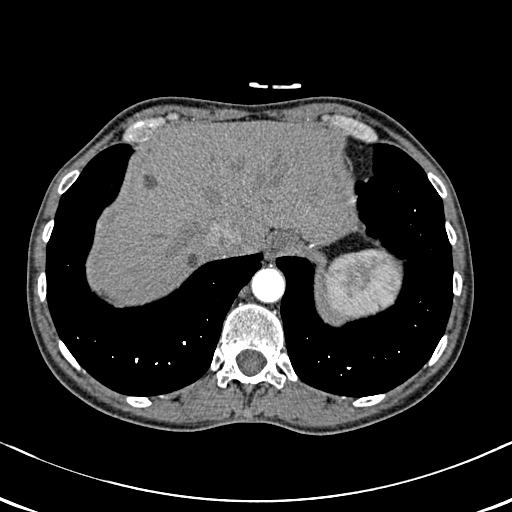
[im 84/336  lung]
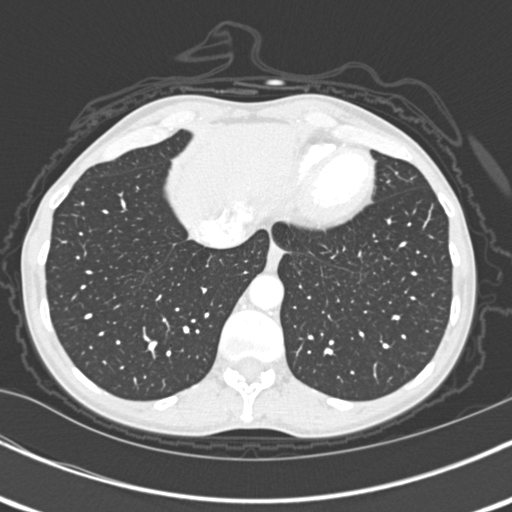
[im 101/336  mediastinal]
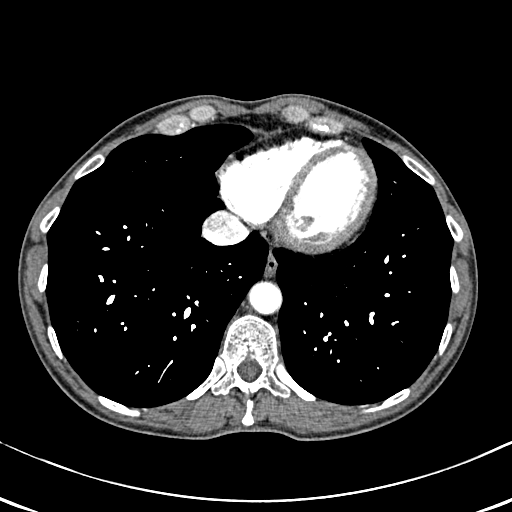
[im 118/336  lung]
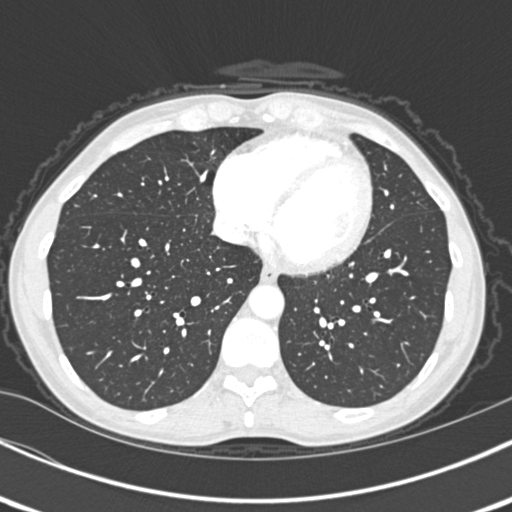
[im 135/336  mediastinal]
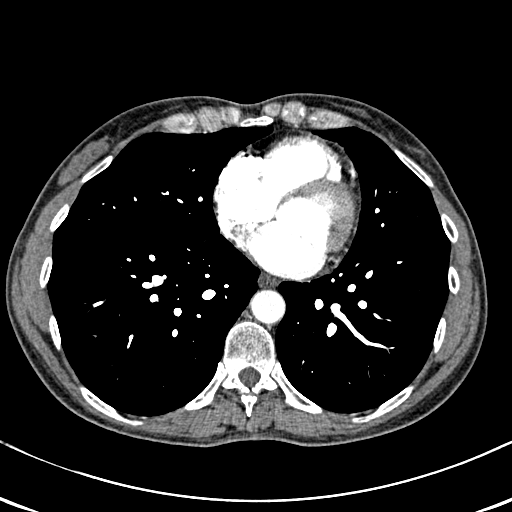
[im 151/336  lung]
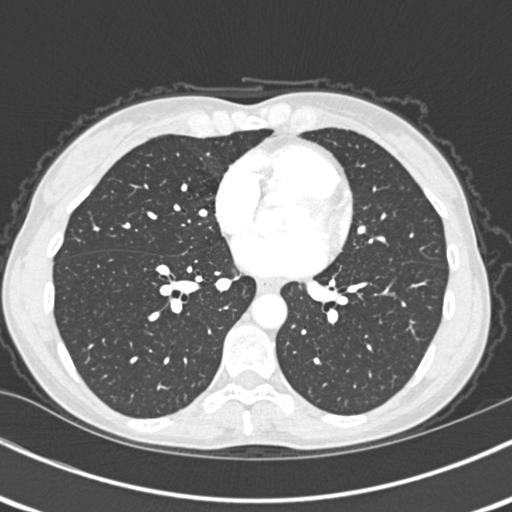
[im 185/336  mediastinal]
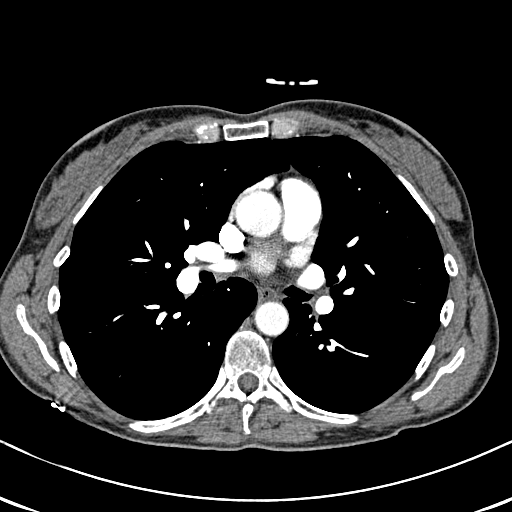
[im 202/336  lung]
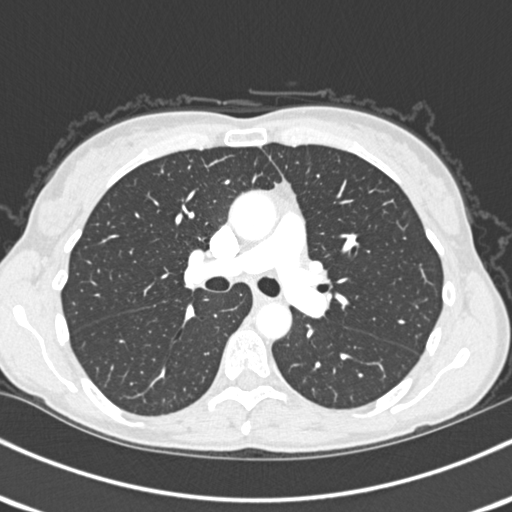
[im 218/336  mediastinal]
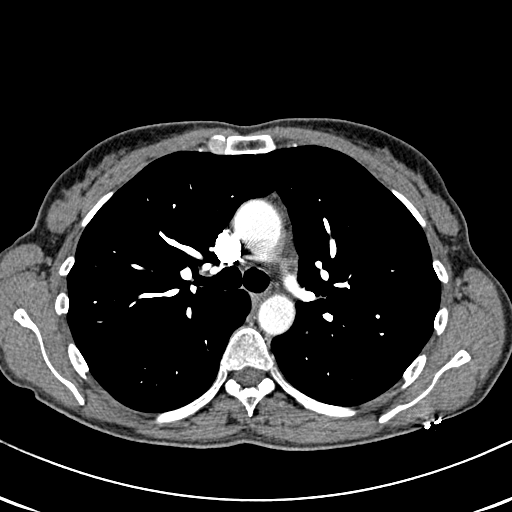
[im 235/336  lung]
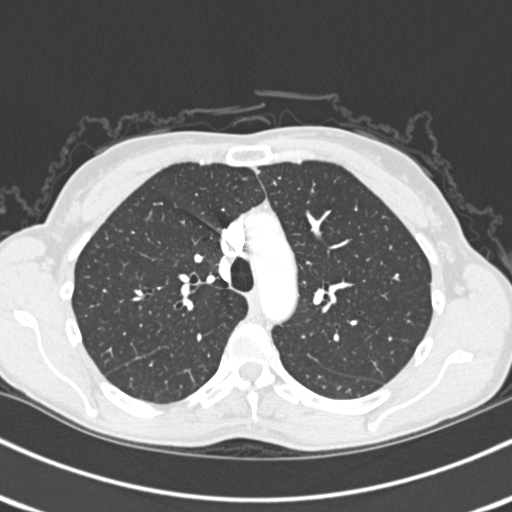
[im 252/336  mediastinal]
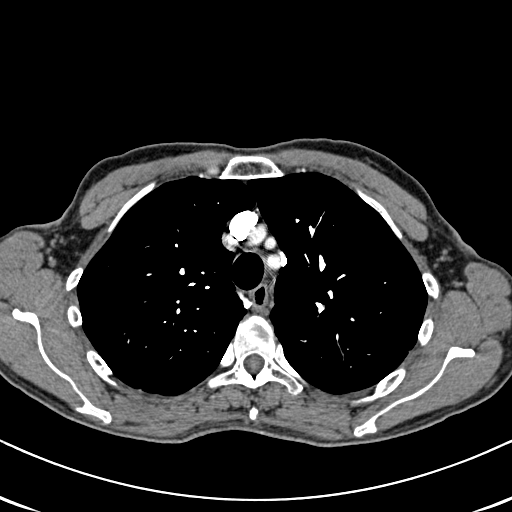
[im 269/336  lung]
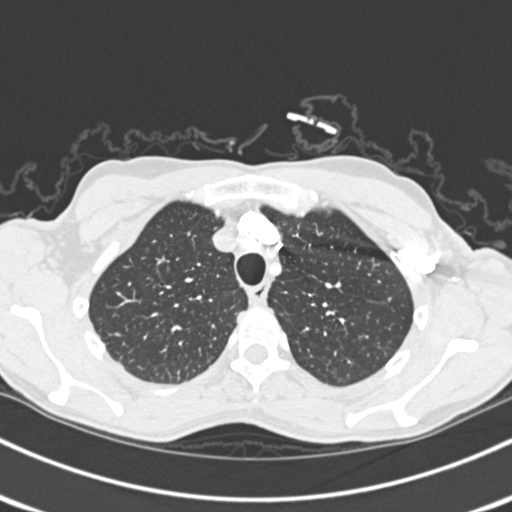
[im 285/336  mediastinal]
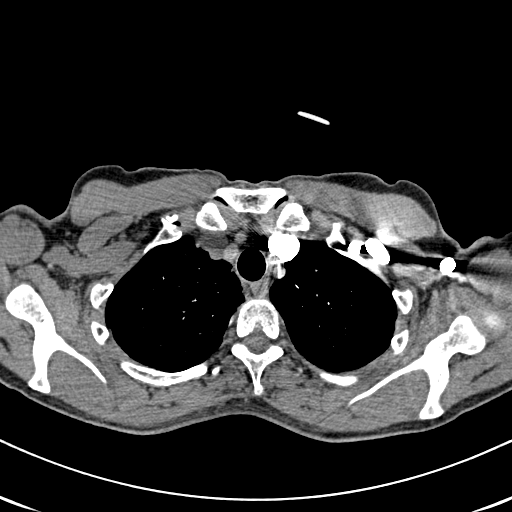
[im 302/336  lung]
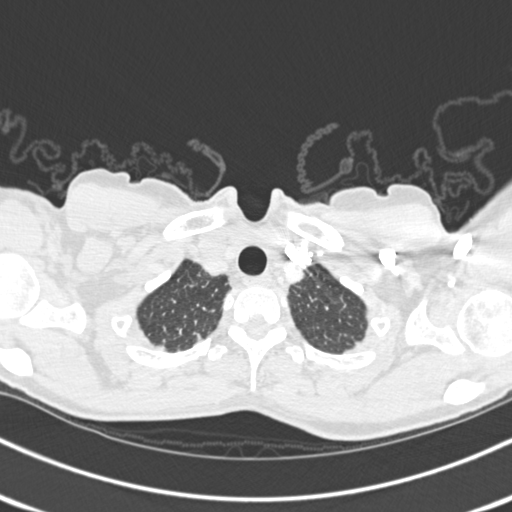
[im 319/336  mediastinal]
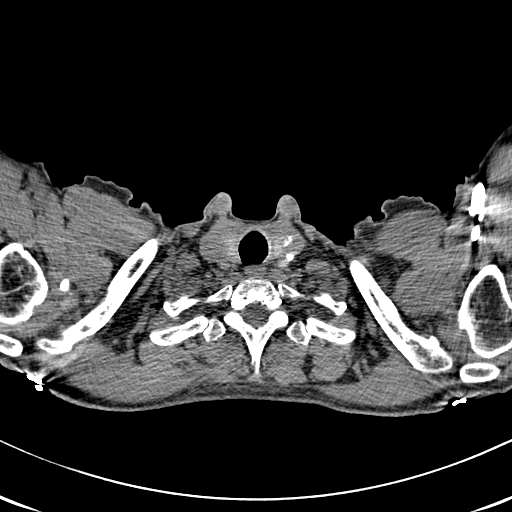

[Series 602: <mpr thick range> · coronal · 0.66mm/px · 1 of 120 slices shown]
[im 60/120  mediastinal]
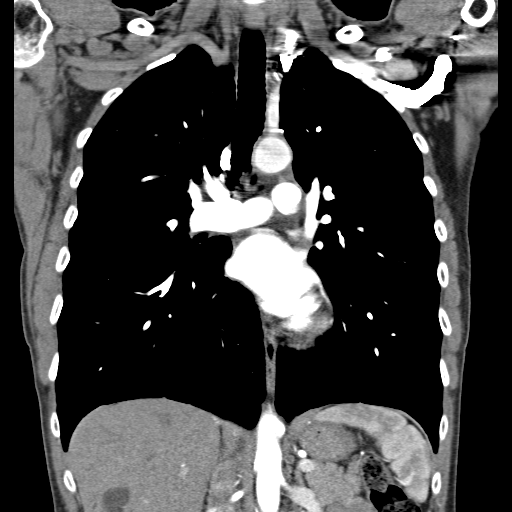

[19 of 36 positions shown; findings below may reference images not displayed]

FINDINGS: Angiographic study: No evidence of a pulmonary embolism. Great
vessels are normal in caliber. No aortic dissection. No
atherosclerotic plaque.

Neck base and axilla: No mass or adenopathy. Visualized thyroid is
unremarkable.

Mediastinum and hila: Heart normal in size and configuration. No
coronary artery calcifications are evident. No mediastinal or hilar
masses or evidence of adenopathy.

Lungs and pleura: Minor scarring at the apices. Lungs are otherwise
clear. No pleural effusion. No pneumothorax.

Limited upper abdomen: Multiple low-density liver lesions likely all
cysts. Otherwise unremarkable.

Musculoskeletal:  Normal.

Review of the MIP images confirms the above findings.
IMPRESSION: 1. No evidence of a pulmonary embolus. No acute findings. Normal CTA
of the chest.

## 2017-06-05 DIAGNOSIS — M542 Cervicalgia: Secondary | ICD-10-CM | POA: Diagnosis not present

## 2017-06-24 DIAGNOSIS — G4700 Insomnia, unspecified: Secondary | ICD-10-CM | POA: Diagnosis not present

## 2017-06-24 DIAGNOSIS — N951 Menopausal and female climacteric states: Secondary | ICD-10-CM | POA: Diagnosis not present

## 2017-07-15 ENCOUNTER — Other Ambulatory Visit: Payer: Self-pay | Admitting: Adult Health

## 2017-07-15 DIAGNOSIS — J42 Unspecified chronic bronchitis: Secondary | ICD-10-CM

## 2017-07-23 DIAGNOSIS — Z713 Dietary counseling and surveillance: Secondary | ICD-10-CM | POA: Diagnosis not present

## 2017-08-12 ENCOUNTER — Other Ambulatory Visit: Payer: Self-pay | Admitting: Physician Assistant

## 2017-08-12 DIAGNOSIS — J42 Unspecified chronic bronchitis: Secondary | ICD-10-CM

## 2017-08-13 ENCOUNTER — Other Ambulatory Visit: Payer: Self-pay | Admitting: Internal Medicine

## 2017-08-13 ENCOUNTER — Other Ambulatory Visit: Payer: Self-pay | Admitting: Adult Health

## 2017-08-13 DIAGNOSIS — J42 Unspecified chronic bronchitis: Secondary | ICD-10-CM

## 2017-08-13 DIAGNOSIS — F325 Major depressive disorder, single episode, in full remission: Secondary | ICD-10-CM

## 2017-08-13 MED ORDER — AMPHETAMINE-DEXTROAMPHETAMINE 20 MG PO TABS: 10.0000 mg | ORAL_TABLET | Freq: Two times a day (BID) | ORAL | 0 refills | Status: DC

## 2017-08-26 DIAGNOSIS — M5412 Radiculopathy, cervical region: Secondary | ICD-10-CM | POA: Diagnosis not present

## 2017-08-28 ENCOUNTER — Ambulatory Visit: Payer: BLUE CROSS/BLUE SHIELD | Admitting: Adult Health

## 2017-08-28 ENCOUNTER — Ambulatory Visit: Payer: Self-pay | Admitting: Adult Health

## 2017-08-28 ENCOUNTER — Encounter: Payer: Self-pay | Admitting: Adult Health

## 2017-08-28 VITALS — BP 110/76 | HR 96 | Temp 97.3°F | Ht 64.0 in | Wt 129.4 lb

## 2017-08-28 DIAGNOSIS — E559 Vitamin D deficiency, unspecified: Secondary | ICD-10-CM | POA: Diagnosis not present

## 2017-08-28 DIAGNOSIS — J42 Unspecified chronic bronchitis: Secondary | ICD-10-CM

## 2017-08-28 DIAGNOSIS — F419 Anxiety disorder, unspecified: Secondary | ICD-10-CM | POA: Diagnosis not present

## 2017-08-28 DIAGNOSIS — Z79899 Other long term (current) drug therapy: Secondary | ICD-10-CM

## 2017-08-28 DIAGNOSIS — K2100 Gastro-esophageal reflux disease with esophagitis, without bleeding: Secondary | ICD-10-CM

## 2017-08-28 DIAGNOSIS — G44209 Tension-type headache, unspecified, not intractable: Secondary | ICD-10-CM | POA: Diagnosis not present

## 2017-08-28 DIAGNOSIS — R51 Headache: Secondary | ICD-10-CM | POA: Diagnosis not present

## 2017-08-28 DIAGNOSIS — E785 Hyperlipidemia, unspecified: Secondary | ICD-10-CM | POA: Diagnosis not present

## 2017-08-28 DIAGNOSIS — F325 Major depressive disorder, single episode, in full remission: Secondary | ICD-10-CM | POA: Diagnosis not present

## 2017-08-28 DIAGNOSIS — R519 Headache, unspecified: Secondary | ICD-10-CM

## 2017-08-28 MED ORDER — AMPHETAMINE-DEXTROAMPHETAMINE 20 MG PO TABS: 10.0000 mg | ORAL_TABLET | Freq: Two times a day (BID) | ORAL | 0 refills | Status: DC

## 2017-08-28 MED ORDER — BUTALBITAL-APAP-CAFFEINE 50-325-40 MG PO TABS: 1.0000 | ORAL_TABLET | Freq: Four times a day (QID) | ORAL | 0 refills | Status: DC | PRN

## 2017-08-28 NOTE — Progress Notes (Signed)
FOLLOW UP  Assessment and Plan:   Cholesterol Not currently on medication; patient reports significant dietary changes -  Anticipate improvement Continue low cholesterol diet and exercise.  Check lipid panel.   Major Depression currently in remission/anxiety Continue medications - discussed risks of daily benzo use, recommended regular medication vacations and minimal necessary use Lifestyle discussed: diet/exerise, sleep hygiene, stress management, hydration  GERD/Barrett's esophagus Well managed on current medications - dexilant 60 mg daily - Followed by Dr. Ferdinand Lango  Discussed diet, avoiding triggers and other lifestyle changes  Vitamin D Def/ osteoporosis prevention Continue supplementation Check Vit D level  Cervical degenerative disc disease Dry needling helped associated trapezius tension/spasms - muscle relaxers were not helpful Interfering with sleep but patient declining further interventions at this time - patient may call back should she change her mind Follow up with Dr. Rip Harbour -   Continue diet and meds as discussed. Further disposition pending results of labs. Discussed med's effects and SE's.   Over 30 minutes of exam, counseling, chart review, and critical decision making was performed.   No future appointments.  ----------------------------------------------------------------------------------------------------------------------  HPI 57 y.o. female  presents for 6 month follow up and medication management for severe GERD with Barrett's esphagitis, hyperlipidemia with goal LDL <100, major depression in remission, anxiety, vitamin D deficiency. She has ongoing neck pain for which she hadbeen seeing Dr. Rip Harbour at [place] - reports she was told she has "bone on bone" degeneration - did PT for a while but recently has seen Dr. Gladstone Lighter and Dr. Nelva Bush and is having injections - she reports some improvement with injections ~2 months.   Symptoms do  significantly interfere with sleep quality - has tried several medications including amitriptyline, Soma - she reported she did not like SE - stopped taking. She has been sleeping upright in a recliner. She is prescribed xanax 0.5 mg once at night and doing fairly with this.   Patient is on an ADD medication, she states that the medication is helping and she denies any adverse reactions. She currently takes on days that she works.   BMI is Body mass index is 22.21 kg/m., she has been working on diet and exercise. Wt Readings from Last 3 Encounters:  08/28/17 129 lb 6.4 oz (58.7 kg)  03/05/17 128 lb (58.1 kg)  09/12/16 124 lb 12.8 oz (56.6 kg)   Today their BP is BP: 110/76    she has a diagnosis of GERD/Barrett's which is currently managed by dexilant 60 mg daily  -  she reports symptoms is currently well controlled, and denies breakthrough reflux, burning in chest, hoarseness or cough.  Followed by [facility] Ferdinand [facility]ango at [facility] - will have repeat endoscopy soon.    She is not on cholesterol medication and denies myalgias- she is following a nutritarian diet for cholesterol for past few months and anticipates significant cholesterol improvement.  Her cholesterol is not at goal at the last visit.  The cholesterol last visit was:   Lab Results  Component Value Date   CHOL 219 (H) 03/05/2017   HDL 73 03/05/2017   LDLCALC 128 (H) 03/05/2017   TRIG 79 03/05/2017   CHOLHDL 3.0 03/05/2017   Patient is on Vitamin D supplement but not currently taking consistently:    Lab Results  Component Value Date   VD25OH 33 03/05/2017      Current Medications:  Current Outpatient Medications on File Prior to Visit  Medication Sig  . albuterol (PROVENTIL HFA;VENTOLIN HFA) 108 (90 Base) MCG/ACT  inhaler Inhale 2 puffs into the lungs every 6 (six) hours as needed for wheezing or shortness of breath.  . ALPRAZolam (XANAX) 0.5 MG tablet Take 1/2 to 1 tablet 2 to 3 x / day ONLY if needed for Anxiety Attack  or Sleep  & please try to limit to 5 days / week to avoid addiction  . amphetamine-dextroamphetamine (ADDERALL) 20 MG tablet Take 0.5 tablets (10 mg total) by mouth 2 (two) times daily with a meal.  . butalbital-acetaminophen-caffeine (FIORICET, ESGIC) 50-325-40 MG tablet Take 1 tablet by mouth every 6 (six) hours as needed for headache.  . Cholecalciferol (VITAMIN D3) 5000 units CAPS Take by mouth.  . DEXILANT 60 MG capsule   . EPIDUO 0.1-2.5 % gel APPLY SMALL AMOUNT AT BEDTIME AS NEEDED  . estradiol (ESTRACE) 2 MG tablet Take 0.5-1 tablets (1-2 mg total) by mouth daily. (Patient taking differently: Take 4 mg by mouth daily. )  . tretinoin (RETIN-A) 0.025 % gel Apply topically at bedtime.  . AMITIZA 24 MCG capsule Take 24 mcg by mouth daily.   No current facility-administered medications on file prior to visit.      Allergies:  Allergies  Allergen Reactions  . Brintellix [Vortioxetine] Nausea Only     Medical History:  Past Medical History:  Diagnosis Date  . Allergy   . Anxiety   . Barrett's esophagus   . Depression   . GERD (gastroesophageal reflux disease)   . History of bladder surgery   . Miscarriage    twice  . Peptic ulcer disease    Family history- Reviewed and unchanged Social history- Reviewed and unchanged   Review of Systems:  Review of Systems  Constitutional: Negative for malaise/fatigue and weight loss.  HENT: Negative for hearing loss and tinnitus.   Eyes: Negative for blurred vision and double vision.  Respiratory: Negative for cough, shortness of breath and wheezing.   Cardiovascular: Negative for chest pain, palpitations, orthopnea, claudication and leg swelling.  Gastrointestinal: Negative for abdominal pain, blood in stool, constipation, diarrhea, heartburn, melena, nausea and vomiting.  Genitourinary: Negative.   Musculoskeletal: Positive for neck pain. Negative for joint pain and myalgias.  Skin: Negative for rash.  Neurological: Positive for  headaches (r/t neck pain). Negative for dizziness, tingling, sensory change and weakness.  Endo/Heme/Allergies: Negative for polydipsia.  Psychiatric/Behavioral: Negative for depression. The patient has insomnia (Related to neck pain). The patient is not nervous/anxious (Currently high stress related to work and neck pain).   All other systems reviewed and are negative.    Physical Exam: BP 110/76   Pulse 96   Temp (!) 97.3 F (36.3 C)   Ht 5\' 4"  (1.626 m)   Wt 129 lb 6.4 oz (58.7 kg)   SpO2 99%   BMI 22.21 kg/m  Wt Readings from Last 3 Encounters:  08/28/17 129 lb 6.4 oz (58.7 kg)  03/05/17 128 lb (58.1 kg)  09/12/16 124 lb 12.8 oz (56.6 kg)   General Appearance: Well nourished, in no apparent distress. Eyes: PERRLA, EOMs, conjunctiva no swelling or erythema Sinuses: No Frontal/maxillary tenderness ENT/Mouth: Ext aud canals clear, TMs without erythema, bulging. No erythema, swelling, or exudate on post pharynx.  Tonsils not swollen or erythematous. Hearing normal.  Neck: Supple, thyroid normal.  Respiratory: Respiratory effort normal, BS equal bilaterally without rales, rhonchi, wheezing or stridor.  Cardio: RRR with no MRGs. Brisk peripheral pulses without edema.  Abdomen: Soft, + BS.  Non tender, no guarding, rebound, hernias, masses. Lymphatics: Non tender without  lymphadenopathy.  Musculoskeletal: Full ROM, 5/5 strength, Normal gait. No cervical point/bony tenderness, some tension through bilateral traps Skin: Warm, dry without rashes, lesions, ecchymosis.  Neuro: Cranial nerves intact. No cerebellar symptoms.  Psych: Awake and oriented X 3, normal affect, Insight and Judgment appropriate.    [name], NP 4:10 PM [facility]

## 2017-08-28 NOTE — Patient Instructions (Addendum)
Aim for 7+ servings of fruits and vegetables daily  80+ fluid ounces of water or unsweet tea for healthy kidneys  Limit alcohol intake  Limit animal fats in diet for cholesterol and heart health - choose grass fed whenever available  Aim for low stress - take time to unwind and care for your mental health  Aim for 150 min of moderate intensity exercise weekly for heart health, and weights twice weekly for bone health  Aim for 7-9 hours of sleep daily    Please remember, common headache triggers are: sleep deprivation, dehydration, overheating, stress, hypoglycemia or skipping meals and blood sugar fluctuations, excessive pain medications or excessive alcohol use or caffeine withdrawal.   Some people have food triggers such as aged cheese, orange juice or chocolate, especially dark chocolate, or MSG (monosodium glutamate). Try to avoid these headache triggers as much possible.   It may be helpful to keep a headache diary to figure out what makes your headaches worse or brings them on and what alleviates them. Some people report headache onset after exercise but studies have shown that regular exercise may actually prevent headaches from coming. If you have exercise-induced headaches, please make sure that you drink plenty of fluid before and after exercising and that you do not over do it and do not overheat.  Also you can try CoQ10 100mg  TID or B2 400mg  a day as prevention. Can always use tylenol, aleve or ibuprofen for pain as well.   Also there is such a thing called rebound headache from over use of acute medications.  Please do not use rescue or acute medications more than 10 days a month or more than 3 days per week, this can cause a withdrawal and a rebound headache.   Please go to the ER if there is weakness, thunderclap headache, visual changes, or any concerning factors     Fat and Cholesterol Restricted Diet Getting too much fat and cholesterol in your diet may cause health  problems. Following this diet helps keep your fat and cholesterol at normal levels. This can keep you from getting sick. What types of fat should I choose?  Choose monosaturated and polyunsaturated fats. These are found in foods such as olive oil, canola oil, flaxseeds, walnuts, almonds, and seeds.  Eat more omega-3 fats. Good choices include salmon, mackerel, sardines, tuna, flaxseed oil, and ground flaxseeds.  Limit saturated fats. These are in animal products such as meats, butter, and cream. They can also be in plant products such as palm oil, palm kernel oil, and coconut oil.  Avoid foods with partially hydrogenated oils in them. These contain trans fats. Examples of foods that have trans fats are stick margarine, some tub margarines, cookies, crackers, and other baked goods. What general guidelines do I need to follow?  Check food labels. Look for the words "trans fat" and "saturated fat."  When preparing a meal: ? Fill half of your plate with vegetables and green salads. ? Fill one fourth of your plate with whole grains. Look for the word "whole" as the first word in the ingredient list. ? Fill one fourth of your plate with lean protein foods.  Eat more foods that have fiber, like apples, carrots, beans, peas, and barley.  Eat more home-cooked foods. Eat less at restaurants and buffets.  Limit or avoid alcohol.  Limit foods high in starch and sugar.  Limit fried foods.  Cook foods without frying them. Baking, boiling, grilling, and broiling are all great options.  Lose weight if you are overweight. Losing even a small amount of weight can help your overall health. It can also help prevent diseases such as diabetes and heart disease. What foods can I eat? Grains Whole grains, such as whole wheat or whole grain breads, crackers, cereals, and pasta. Unsweetened oatmeal, bulgur, barley, quinoa, or brown rice. Corn or whole wheat flour tortillas. Vegetables Fresh or frozen  vegetables (raw, steamed, roasted, or grilled). Green salads. Fruits All fresh, canned (in natural juice), or frozen fruits. Meat and Other Protein Products Ground beef (85% or leaner), grass-fed beef, or beef trimmed of fat. Skinless chicken or [place]. Ground chicken or [place]. Pork trimmed of fat. All fish and seafood. Eggs. Dried beans, peas, or lentils. Unsalted nuts or seeds. Unsalted canned or dry beans. Dairy Low-fat dairy products, such as skim or 1% milk, 2% or reduced-fat cheeses, low-fat ricotta or cottage cheese, or plain low-fat yogurt. Fats and Oils Tub margarines without trans fats. Light or reduced-fat mayonnaise and salad dressings. Avocado. Olive, canola, sesame, or safflower oils. Natural peanut or almond butter (choose ones without added sugar and oil). The items listed above may not be a complete list of recommended foods or beverages. Contact your dietitian for more options. What foods are not recommended? Grains White bread. White pasta. White rice. Cornbread. Bagels, pastries, and croissants. Crackers that contain trans fat. Vegetables White potatoes. Corn. Creamed or fried vegetables. Vegetables in a cheese sauce. Fruits Dried fruits. Canned fruit in light or heavy syrup. Fruit juice. Meat and Other Protein Products Fatty cuts of meat. Ribs, chicken wings, bacon, sausage, bologna, salami, chitterlings, fatback, hot dogs, bratwurst, and packaged luncheon meats. Liver and organ meats. Dairy Whole or 2% milk, cream, half-and-half, and cream cheese. Whole milk cheeses. Whole-fat or sweetened yogurt. Full-fat cheeses. Nondairy creamers and whipped toppings. Processed cheese, cheese spreads, or cheese curds. Sweets and Desserts Corn syrup, sugars, honey, and molasses. Candy. Jam and jelly. Syrup. Sweetened cereals. Cookies, pies, cakes, donuts, muffins, and ice cream. Fats and Oils Butter, stick margarine, lard, shortening, ghee, or bacon fat. Coconut, palm kernel, or palm  oils. Beverages Alcohol. Sweetened drinks (such as sodas, lemonade, and fruit drinks or punches). The items listed above may not be a complete list of foods and beverages to avoid. Contact your dietitian for more information. This information is not intended to replace advice given to you by your health care provider. Make sure you discuss any questions you have with your health care provider. Document Released: 09/10/2011 Document Revised: 11/16/2015 Document Reviewed: 06/10/2013 Elsevier Interactive Patient Education  [employer].

## 2017-08-29 LAB — CBC WITH DIFFERENTIAL/PLATELET
BASOS ABS: 50 {cells}/uL (ref 0–200)
BASOS PCT: 0.7 %
EOS ABS: 170 {cells}/uL (ref 15–500)
EOS PCT: 2.4 %
HEMATOCRIT: 35.8 % (ref 35.0–45.0)
HEMOGLOBIN: 12.4 g/dL (ref 11.7–15.5)
LYMPHS ABS: 2627 {cells}/uL (ref 850–3900)
MCH: 33.2 pg — ABNORMAL HIGH (ref 27.0–33.0)
MCHC: 34.6 g/dL (ref 32.0–36.0)
MCV: 95.7 fL (ref 80.0–100.0)
MPV: 10 fL (ref 7.5–12.5)
Monocytes Relative: 10.2 %
NEUTROS ABS: 3529 {cells}/uL (ref 1500–7800)
Neutrophils Relative %: 49.7 %
Platelets: 268 10*3/uL (ref 140–400)
RBC: 3.74 10*6/uL — ABNORMAL LOW (ref 3.80–5.10)
RDW: 12.1 % (ref 11.0–15.0)
Total Lymphocyte: 37 %
WBC mixed population: 724 cells/uL (ref 200–950)
WBC: 7.1 10*3/uL (ref 3.8–10.8)

## 2017-08-29 LAB — COMPLETE METABOLIC PANEL WITH GFR
AG Ratio: 1.8 (calc) (ref 1.0–2.5)
ALKALINE PHOSPHATASE (APISO): 60 U/L (ref 33–130)
ALT: 13 U/L (ref 6–29)
AST: 16 U/L (ref 10–35)
Albumin: 4.5 g/dL (ref 3.6–5.1)
BUN: 14 mg/dL (ref 7–25)
CALCIUM: 9.7 mg/dL (ref 8.6–10.4)
CO2: 32 mmol/L (ref 20–32)
CREATININE: 0.62 mg/dL (ref 0.50–1.05)
Chloride: 103 mmol/L (ref 98–110)
GFR, EST NON AFRICAN AMERICAN: 102 mL/min/{1.73_m2} (ref >=60)
GFR, Est African American: 118 mL/min/{1.73_m2} (ref >=60)
GLOBULIN: 2.5 g/dL (ref 1.9–3.7)
GLUCOSE: 92 mg/dL (ref 65–99)
Potassium: 4.8 mmol/L (ref 3.5–5.3)
SODIUM: 141 mmol/L (ref 135–146)
Total Bilirubin: 0.3 mg/dL (ref 0.2–1.2)
Total Protein: 7 g/dL (ref 6.1–8.1)

## 2017-08-29 LAB — LIPID PANEL
CHOL/HDL RATIO: 3.3 (calc) (ref <5.0)
CHOLESTEROL: 235 mg/dL — AB (ref <200)
HDL: 71 mg/dL (ref >50)
LDL CHOLESTEROL (CALC): 142 mg/dL — AB
NON-HDL CHOLESTEROL (CALC): 164 mg/dL — AB (ref <130)
TRIGLYCERIDES: 104 mg/dL (ref <150)

## 2017-08-29 LAB — TSH: TSH: 0.74 m[IU]/L

## 2017-09-02 ENCOUNTER — Ambulatory Visit: Payer: Self-pay | Admitting: Adult Health

## 2017-09-15 ENCOUNTER — Encounter: Payer: Self-pay | Admitting: Physician Assistant

## 2017-10-20 ENCOUNTER — Other Ambulatory Visit: Payer: Self-pay

## 2017-10-20 DIAGNOSIS — J42 Unspecified chronic bronchitis: Secondary | ICD-10-CM

## 2017-10-20 NOTE — Telephone Encounter (Signed)
Refill request for Alprazolam. PMP checked, last filled on 08/28/17. LOV on 08/28/17, NOV on 11/18/17.

## 2017-10-21 MED ORDER — ALPRAZOLAM 0.5 MG PO TABS: ORAL_TABLET | ORAL | 0 refills | Status: DC

## 2017-10-29 DIAGNOSIS — Z713 Dietary counseling and surveillance: Secondary | ICD-10-CM | POA: Diagnosis not present

## 2017-11-17 NOTE — Progress Notes (Signed)
Complete Physical  Assessment and Plan:   Routine general medical examination at a health care facility   Hyperlipidemia LDL goal <100 - check lipids, decrease fatty foods, increase activity, wants to avoid medications  - CBC with Differential/Platelet - CMP/GFR - Lipid panel   Vitamin D deficiency - VITAMIN D 25 Hydroxy (Vit-D Deficiency, Fractures)  Medication management - Magnesium  Barrett's esophagus with dysplasia -controlled, continue the same medications, follow up Dr. Collier Salina  Gastroesophageal reflux disease with esophagitis Continue PPI/H2 blocker, diet discussed, follow up Dr. Collier Salina   Liver cyst Monitor/benign, pt reassured   Dysplastic nevus Continue fu derm   Depression, major, in remission ([place]) - TSH - amphetamine-dextroamphetamine (ADDERALL) 20 MG tablet; Take 0.5 tablets (10 mg total) by mouth 2 (two) times daily with a meal.  Dispense: 60 tablet; Refill: 0   Nonintractable headache, unspecified chronicity pattern, unspecified headache type Continue meds PRN, get on allergy pill  Insomnia Insomnia- good sleep hygiene discussed, increase day time activity, try melatonin or benadryl, continue xanax for now but discussed once work settles down we need to try something else, discussed trazodone. Patient has failed gabapentin in the past  Allergy, subsequent encounter - Allegra OTC, increase H20, allergy hygiene explained.  Screening for blood or protein in urine - Urinalysis, Routine w reflex microscopic (not at [facility]) - Microalbumin / creatinine urine ratio  Acne, unspecified acne type - tretinoin (RETIN-A) 0.025 % gel; Apply topically at bedtime.  Dispense: 45 g; Refill: 1 -     Adapalene-Benzoyl Peroxide 0.1-2.5 % gel; Apply small amount at bed time as needed  Anemia, unspecified type -     CBC -     Iron and TIBC  Discussed med's effects and SE's. Screening labs and tests as requested with regular follow-up as recommended. Over 40 minutes of exam,  counseling, chart review, and complex, high level critical decision making was performed this visit.   Future Appointments  Date Time Provider [place]  11/19/2018  3:00 PM [name], NP [facility] None     HPI  57 y.o. female  presents for a complete physical. She has Environmental allergies; Anxiety; Depression, major, in remission ([place]); Hyperlipidemia LDL goal <100; History of dysplastic nevus; Vitamin D deficiency; Gastroesophageal reflux disease with esophagitis; Barrett's esophagus; Liver cyst; and DDD (degenerative disc disease), cervical on their problem list. She is followed by GYN Dr. Corinna Capra; She has had TAH and has osteopenia, on estrogen for osteopenia as well as hot flashes, on bASA.   She had some pseudodementia with normal testing after her grandmother passed, husband had surgery this year, was on now on adderall for focus at work only, currently understaffed and very busy having to do overtime. Doing fairly. Has xanax but rarely uses during the day, typically takes to sleep at night. Has tried gabapentin before and had too much fogginess. Not a good time to try to taper off of xanax, will discuss next visit.   She has history of dysplastic nevus, and follows with Dr. Ubaldo Glassing.   She has neck pain, arthritis, with intermittent R radicular symptoms, following with neurology and getting shots. Too soon for surgery   BMI is Body mass index is 22.49 kg/m., she has been working on diet and exercise. Wt Readings from Last 3 Encounters:  11/18/17 131 lb (59.4 kg)  08/28/17 129 lb 6.4 oz (58.7 kg)  03/05/17 128 lb (58.1 kg)   Her blood pressure has been controlled at home, today their BP is BP: 116/80  She does workout, walks daily. She denies chest pain, shortness of breath, dizziness.   She is not on cholesterol medication and denies myalgias. Her cholesterol is at goal. The cholesterol last visit was:   Lab Results  Component Value Date   CHOL 235 (H) 08/28/2017    HDL 71 08/28/2017   LDLCALC 142 (H) 08/28/2017   TRIG 104 08/28/2017   CHOLHDL 3.3 08/28/2017   Last A1C in the office was:  Lab Results  Component Value Date   HGBA1C 5.0 09/08/2013   Patient is on Vitamin D supplement.   Lab Results  Component Value Date   VD25OH 33 03/05/2017        Current Medications:  Current Outpatient Medications on File Prior to Visit  Medication Sig Dispense Refill  . ALPRAZolam (XANAX) 0.5 MG tablet Take 1/2 to 1 tablet 2 to 3 x / day ONLY if needed for Anxiety Attack or Sleep  & please try to limit to 5 days / week to avoid addiction 60 tablet 0  . amphetamine-dextroamphetamine (ADDERALL) 20 MG tablet Take 0.5 tablets (10 mg total) by mouth 2 (two) times daily with a meal. 60 tablet 0  . butalbital-acetaminophen-caffeine (FIORICET, ESGIC) 50-325-40 MG tablet Take 1 tablet by mouth every 6 (six) hours as needed for headache. 30 tablet 0  . EPIDUO 0.1-2.5 % gel APPLY SMALL AMOUNT AT BEDTIME AS NEEDED 45 g 3  . estradiol (ESTRACE) 2 MG tablet Take 0.5-1 tablets (1-2 mg total) by mouth daily. (Patient taking differently: Take 4 mg by mouth daily. ) 90 tablet 6  . tretinoin (RETIN-A) 0.025 % gel Apply topically at bedtime. 45 g 1  . albuterol (PROVENTIL HFA;VENTOLIN HFA) 108 (90 Base) MCG/ACT inhaler Inhale 2 puffs into the lungs every 6 (six) hours as needed for wheezing or shortness of breath. 1 Inhaler 2  . Cholecalciferol (VITAMIN D3) 5000 units CAPS Take by mouth.     No current facility-administered medications on file prior to visit.    Allergies:  Allergies  Allergen Reactions  . Brintellix [Vortioxetine] Nausea Only   Medical History:  Shehas Environmental allergies; Anxiety; Depression, major, in remission ([place]); Hyperlipidemia LDL goal <100; History of dysplastic nevus; Vitamin D deficiency; Gastroesophageal reflux disease with esophagitis; Barrett's esophagus; Liver cyst; and DDD (degenerative disc disease), cervical on their problem list.    Health Maintenance:   Immunization History  Administered Date(s) Administered  . Influenza Split 12/25/2012, 12/20/2014  . Tdap 09/04/2012    Tetanus: 2014 Pneumovax: N/A Prevnar 13:  N/A Flu vaccine: 2016 Zostavax: N/A  LMP TAH Pap: 2018 Dr. Corinna Capra MGM: 04/2016 gets with Dr. Corinna Capra  DEXA: 2018 osteopenia Dr. Corinna Capra Colonoscopy: Aug 2016 with Dr. Claretta [facility] [place], due 2019  EGD: 2017, + gastritis/ulcer still, was on reglan for 1 month CXR 2012 CT AB 2001 CTA chest negative 2017 PFTs 2017 normal, Dr. Ashok Cordia  Vision: Dr. Jerline Pain, [place], 09/2017 Dental: Dr. Randol Kern, last 2019, goes q35m  Patient Care Team: Unk Pinto, MD as PCP - General (Internal Medicine) Donell Beers, MD as Referring Physician (Orthopedic Surgery) Melrose Nakayama, MD as Consulting Physician (Orthopedic Surgery) Coral Spikes, MD (Unknown Physician Specialty)  Surgical History:  She  has a past surgical history that includes Abdominal hysterectomy (2004); Cesarean section; Bladder suspension; and Tonsillectomy. Family History:  She had a family history includes Asthma in her paternal grandmother; COPD in her maternal grandmother; Heart disease in her father and paternal grandfather; Hyperlipidemia in her father; Hypertension  in her father and mother; Pulmonary embolism in her maternal grandmother. Social History:  She  reports that she is a non-smoker but has been exposed to tobacco smoke. She has never used smokeless tobacco. She reports that she drinks alcohol. She reports that she does not use drugs.  Review of Systems: Review of Systems  Constitutional: Negative.   HENT: Negative.   Eyes: Negative.   Respiratory: Negative.   Cardiovascular: Negative.   Gastrointestinal: Negative.   Genitourinary: Negative for dysuria, flank pain, frequency, hematuria and urgency.  Musculoskeletal: Positive for neck pain (at night, some intermittent right radicular).  Skin: Negative.    Neurological: Positive for headaches (mild, r/t stress).  Endo/Heme/Allergies: Negative.   Psychiatric/Behavioral: Negative for depression, hallucinations, memory loss, substance abuse and suicidal ideas. The patient is nervous/anxious and has insomnia.     Physical Exam: Estimated body mass index is 22.49 kg/m as calculated from the following:   Height as of this encounter: 5\' 4"  (1.626 m).   Weight as of this encounter: 131 lb (59.4 kg). BP 116/80   Pulse 74   Temp (!) 97.2 F (36.2 C)   Ht 5\' 4"  (1.626 m)   Wt 131 lb (59.4 kg)   SpO2 99%   BMI 22.49 kg/m  General Appearance: Well nourished, in no apparent distress.  Eyes: PERRLA, EOMs, conjunctiva no swelling or erythema, normal fundi and vessels.  Sinuses: No Frontal/maxillary tenderness  ENT/Mouth: Ext aud canals clear, normal light reflex with TMs without erythema, bulging. Good dentition. No erythema, swelling, or exudate on post pharynx. Tonsils not swollen or erythematous. Hearing normal. + TMJ Neck: Supple, thyroid normal. No bruits  Respiratory: Respiratory effort normal, BS equal bilaterally without rales, rhonchi, wheezing or stridor.  Cardio: RRR without murmurs, rubs or gallops. Brisk peripheral pulses without edema.  Chest: symmetric, with normal excursions and percussion.  Breasts: defer to GYN Abdomen: Soft, nontender, no guarding, rebound, hernias, masses, or organomegaly.  Lymphatics: Non tender without lymphadenopathy.  Genitourinary: defer to GYN Musculoskeletal: Full ROM all peripheral extremities,5/5 strength, and normal gait., normal distal neuro Skin: Warm, dry without rashes, lesions, ecchymosis. Neuro: Cranial nerves intact, reflexes equal bilaterally. Normal muscle tone, no cerebellar symptoms. Sensation intact.  Psych: Awake and oriented X 3, normal affect, Insight and Judgment appropriate.   EKG: normal last year, low risk, defer  [name] 3:18 PM [facility]  [facility]

## 2017-11-18 ENCOUNTER — Encounter: Payer: Self-pay | Admitting: Adult Health

## 2017-11-18 ENCOUNTER — Ambulatory Visit: Payer: BLUE CROSS/BLUE SHIELD | Admitting: Adult Health

## 2017-11-18 VITALS — BP 116/80 | HR 74 | Temp 97.2°F | Ht 64.0 in | Wt 131.0 lb

## 2017-11-18 DIAGNOSIS — Z86018 Personal history of other benign neoplasm: Secondary | ICD-10-CM

## 2017-11-18 DIAGNOSIS — Z13220 Encounter for screening for lipoid disorders: Secondary | ICD-10-CM | POA: Diagnosis not present

## 2017-11-18 DIAGNOSIS — K2100 Gastro-esophageal reflux disease with esophagitis, without bleeding: Secondary | ICD-10-CM

## 2017-11-18 DIAGNOSIS — Z0000 Encounter for general adult medical examination without abnormal findings: Secondary | ICD-10-CM

## 2017-11-18 DIAGNOSIS — Z131 Encounter for screening for diabetes mellitus: Secondary | ICD-10-CM

## 2017-11-18 DIAGNOSIS — J42 Unspecified chronic bronchitis: Secondary | ICD-10-CM

## 2017-11-18 DIAGNOSIS — D649 Anemia, unspecified: Secondary | ICD-10-CM

## 2017-11-18 DIAGNOSIS — M5030 Other cervical disc degeneration, unspecified cervical region: Secondary | ICD-10-CM

## 2017-11-18 DIAGNOSIS — Z130 Encounter for screening for diseases of the blood and blood-forming organs and certain disorders involving the immune mechanism: Secondary | ICD-10-CM

## 2017-11-18 DIAGNOSIS — E559 Vitamin D deficiency, unspecified: Secondary | ICD-10-CM

## 2017-11-18 DIAGNOSIS — Z136 Encounter for screening for cardiovascular disorders: Secondary | ICD-10-CM

## 2017-11-18 DIAGNOSIS — K22719 Barrett's esophagus with dysplasia, unspecified: Secondary | ICD-10-CM

## 2017-11-18 DIAGNOSIS — Z1389 Encounter for screening for other disorder: Secondary | ICD-10-CM

## 2017-11-18 DIAGNOSIS — F325 Major depressive disorder, single episode, in full remission: Secondary | ICD-10-CM

## 2017-11-18 DIAGNOSIS — Z1329 Encounter for screening for other suspected endocrine disorder: Secondary | ICD-10-CM | POA: Diagnosis not present

## 2017-11-18 DIAGNOSIS — Z79899 Other long term (current) drug therapy: Secondary | ICD-10-CM

## 2017-11-18 DIAGNOSIS — Z0001 Encounter for general adult medical examination with abnormal findings: Secondary | ICD-10-CM

## 2017-11-18 DIAGNOSIS — F419 Anxiety disorder, unspecified: Secondary | ICD-10-CM

## 2017-11-18 DIAGNOSIS — Z9109 Other allergy status, other than to drugs and biological substances: Secondary | ICD-10-CM

## 2017-11-18 DIAGNOSIS — Z6822 Body mass index (BMI) 22.0-22.9, adult: Secondary | ICD-10-CM

## 2017-11-18 DIAGNOSIS — K7689 Other specified diseases of liver: Secondary | ICD-10-CM

## 2017-11-18 DIAGNOSIS — E785 Hyperlipidemia, unspecified: Secondary | ICD-10-CM

## 2017-11-18 MED ORDER — AMPHETAMINE-DEXTROAMPHETAMINE 20 MG PO TABS: 10.0000 mg | ORAL_TABLET | Freq: Two times a day (BID) | ORAL | 0 refills | Status: DC

## 2017-11-18 NOTE — Patient Instructions (Addendum)
Goals    . LDL CALC < 125         Know what a healthy weight is for you (roughly BMI <25) and aim to maintain this  Aim for 7+ servings of fruits and vegetables daily  65-80+ fluid ounces of water or unsweet tea for healthy kidneys  Limit to max 1 drink of alcohol per day; avoid smoking/tobacco  Limit animal fats in diet for cholesterol and heart health - choose grass fed whenever available  Avoid highly processed foods, and foods high in saturated/trans fats  Aim for low stress - take time to unwind and care for your mental health  Aim for 150 min of moderate intensity exercise weekly for heart health, and weights twice weekly for bone health  Aim for 7-9 hours of sleep daily    Fat and Cholesterol Restricted Diet Getting too much fat and cholesterol in your diet may cause health problems. Following this diet helps keep your fat and cholesterol at normal levels. This can keep you from getting sick. What types of fat should I choose?  Choose monosaturated and polyunsaturated fats. These are found in foods such as olive oil, canola oil, flaxseeds, walnuts, almonds, and seeds.  Eat more omega-3 fats. Good choices include salmon, mackerel, sardines, tuna, flaxseed oil, and ground flaxseeds.  Limit saturated fats. These are in animal products such as meats, butter, and cream. They can also be in plant products such as palm oil, palm kernel oil, and coconut oil.  Avoid foods with partially hydrogenated oils in them. These contain trans fats. Examples of foods that have trans fats are stick margarine, some tub margarines, cookies, crackers, and other baked goods. What general guidelines do I need to follow?  Check food labels. Look for the words "trans fat" and "saturated fat."  When preparing a meal: ? Fill half of your plate with vegetables and green salads. ? Fill one fourth of your plate with whole grains. Look for the word "whole" as the first word in the ingredient  list. ? Fill one fourth of your plate with lean protein foods.  Eat more foods that have fiber, like apples, carrots, beans, peas, and barley.  Eat more home-cooked foods. Eat less at restaurants and buffets.  Limit or avoid alcohol.  Limit foods high in starch and sugar.  Limit fried foods.  Cook foods without frying them. Baking, boiling, grilling, and broiling are all great options.  Lose weight if you are overweight. Losing even a small amount of weight can help your overall health. It can also help prevent diseases such as diabetes and heart disease. What foods can I eat? Grains Whole grains, such as whole wheat or whole grain breads, crackers, cereals, and pasta. Unsweetened oatmeal, bulgur, barley, quinoa, or brown rice. Corn or whole wheat flour tortillas. Vegetables Fresh or frozen vegetables (raw, steamed, roasted, or grilled). Green salads. Fruits All fresh, canned (in natural juice), or frozen fruits. Meat and Other Protein Products Ground beef (85% or leaner), grass-fed beef, or beef trimmed of fat. Skinless chicken or [place]. Ground chicken or [place]. Pork trimmed of fat. All fish and seafood. Eggs. Dried beans, peas, or lentils. Unsalted nuts or seeds. Unsalted canned or dry beans. Dairy Low-fat dairy products, such as skim or 1% milk, 2% or reduced-fat cheeses, low-fat ricotta or cottage cheese, or plain low-fat yogurt. Fats and Oils Tub margarines without trans fats. Light or reduced-fat mayonnaise and salad dressings. Avocado. Olive, canola, sesame, or safflower oils. Natural peanut  or almond butter (choose ones without added sugar and oil). The items listed above may not be a complete list of recommended foods or beverages. Contact your dietitian for more options. What foods are not recommended? Grains White bread. White pasta. White rice. Cornbread. Bagels, pastries, and croissants. Crackers that contain trans fat. Vegetables White potatoes. Corn. Creamed or  fried vegetables. Vegetables in a cheese sauce. Fruits Dried fruits. Canned fruit in light or heavy syrup. Fruit juice. Meat and Other Protein Products Fatty cuts of meat. Ribs, chicken wings, bacon, sausage, bologna, salami, chitterlings, fatback, hot dogs, bratwurst, and packaged luncheon meats. Liver and organ meats. Dairy Whole or 2% milk, cream, half-and-half, and cream cheese. Whole milk cheeses. Whole-fat or sweetened yogurt. Full-fat cheeses. Nondairy creamers and whipped toppings. Processed cheese, cheese spreads, or cheese curds. Sweets and Desserts Corn syrup, sugars, honey, and molasses. Candy. Jam and jelly. Syrup. Sweetened cereals. Cookies, pies, cakes, donuts, muffins, and ice cream. Fats and Oils Butter, stick margarine, lard, shortening, ghee, or bacon fat. Coconut, palm kernel, or palm oils. Beverages Alcohol. Sweetened drinks (such as sodas, lemonade, and fruit drinks or punches). The items listed above may not be a complete list of foods and beverages to avoid. Contact your dietitian for more information. This information is not intended to replace advice given to you by your health care provider. Make sure you discuss any questions you have with your health care provider. Document Released: 09/10/2011 Document Revised: 11/16/2015 Document Reviewed: 06/10/2013 Elsevier Interactive Patient Education  [employer].

## 2017-11-19 LAB — CBC WITH DIFFERENTIAL/PLATELET
BASOS ABS: 37 {cells}/uL (ref 0–200)
Basophils Relative: 0.5 %
EOS ABS: 131 {cells}/uL (ref 15–500)
EOS PCT: 1.8 %
HCT: 38.2 % (ref 35.0–45.0)
Hemoglobin: 12.8 g/dL (ref 11.7–15.5)
Lymphs Abs: 2161 cells/uL (ref 850–3900)
MCH: 32.7 pg (ref 27.0–33.0)
MCHC: 33.5 g/dL (ref 32.0–36.0)
MCV: 97.4 fL (ref 80.0–100.0)
MONOS PCT: 9.7 %
MPV: 9.5 fL (ref 7.5–12.5)
Neutro Abs: 4263 cells/uL (ref 1500–7800)
Neutrophils Relative %: 58.4 %
PLATELETS: 284 10*3/uL (ref 140–400)
RBC: 3.92 10*6/uL (ref 3.80–5.10)
RDW: 11.9 % (ref 11.0–15.0)
TOTAL LYMPHOCYTE: 29.6 %
WBC mixed population: 708 cells/uL (ref 200–950)
WBC: 7.3 10*3/uL (ref 3.8–10.8)

## 2017-11-19 LAB — LIPID PANEL
Cholesterol: 233 mg/dL — ABNORMAL HIGH (ref <200)
HDL: 92 mg/dL (ref >50)
LDL CHOLESTEROL (CALC): 126 mg/dL — AB
Non-HDL Cholesterol (Calc): 141 mg/dL (calc) — ABNORMAL HIGH (ref <130)
TRIGLYCERIDES: 60 mg/dL (ref <150)
Total CHOL/HDL Ratio: 2.5 (calc) (ref <5.0)

## 2017-11-19 LAB — COMPLETE METABOLIC PANEL WITH GFR
AG Ratio: 1.5 (calc) (ref 1.0–2.5)
ALKALINE PHOSPHATASE (APISO): 60 U/L (ref 33–130)
ALT: 11 U/L (ref 6–29)
AST: 17 U/L (ref 10–35)
Albumin: 4.4 g/dL (ref 3.6–5.1)
BILIRUBIN TOTAL: 0.8 mg/dL (ref 0.2–1.2)
BUN: 12 mg/dL (ref 7–25)
CHLORIDE: 100 mmol/L (ref 98–110)
CO2: 28 mmol/L (ref 20–32)
Calcium: 9.9 mg/dL (ref 8.6–10.4)
Creat: 0.64 mg/dL (ref 0.50–1.05)
GFR, Est African American: 116 mL/min/{1.73_m2} (ref >=60)
GFR, Est Non African American: 100 mL/min/{1.73_m2} (ref >=60)
GLUCOSE: 83 mg/dL (ref 65–99)
Globulin: 2.9 g/dL (calc) (ref 1.9–3.7)
Potassium: 4 mmol/L (ref 3.5–5.3)
Sodium: 138 mmol/L (ref 135–146)
Total Protein: 7.3 g/dL (ref 6.1–8.1)

## 2017-11-19 LAB — URINALYSIS W MICROSCOPIC + REFLEX CULTURE
BILIRUBIN URINE: NEGATIVE
Glucose, UA: NEGATIVE
Hgb urine dipstick: NEGATIVE
Hyaline Cast: NONE SEEN /LPF
KETONES UR: NEGATIVE
Leukocyte Esterase: NEGATIVE
NITRITES URINE, INITIAL: NEGATIVE
PH: 7.5 (ref 5.0–8.0)
Protein, ur: NEGATIVE
Specific Gravity, Urine: 1.009 (ref 1.001–1.03)

## 2017-11-19 LAB — HEMOGLOBIN A1C
Hgb A1c MFr Bld: 4.9 % of total Hgb (ref <5.7)
Mean Plasma Glucose: 94 (calc)
eAG (mmol/L): 5.2 (calc)

## 2017-11-19 LAB — MICROALBUMIN / CREATININE URINE RATIO: Creatinine, Urine: 23 mg/dL (ref 20–275)

## 2017-11-19 LAB — MAGNESIUM: Magnesium: 2 mg/dL (ref 1.5–2.5)

## 2017-11-19 LAB — IRON, TOTAL/TOTAL IRON BINDING CAP
%SAT: 21 % (calc) (ref 16–45)
IRON: 62 ug/dL (ref 45–160)
TIBC: 298 mcg/dL (calc) (ref 250–450)

## 2017-11-19 LAB — NO CULTURE INDICATED

## 2017-11-19 LAB — VITAMIN D 25 HYDROXY (VIT D DEFICIENCY, FRACTURES): VIT D 25 HYDROXY: 37 ng/mL (ref 30–100)

## 2017-11-19 LAB — TSH: TSH: 1.19 m[IU]/L

## 2017-12-11 ENCOUNTER — Other Ambulatory Visit: Payer: Self-pay

## 2017-12-11 DIAGNOSIS — J42 Unspecified chronic bronchitis: Secondary | ICD-10-CM

## 2017-12-11 MED ORDER — ALPRAZOLAM 0.5 MG PO TABS: ORAL_TABLET | ORAL | 0 refills | Status: DC

## 2017-12-16 DIAGNOSIS — K2270 Barrett's esophagus without dysplasia: Secondary | ICD-10-CM | POA: Diagnosis not present

## 2018-01-07 DIAGNOSIS — Z01818 Encounter for other preprocedural examination: Secondary | ICD-10-CM | POA: Diagnosis not present

## 2018-01-07 DIAGNOSIS — K2270 Barrett's esophagus without dysplasia: Secondary | ICD-10-CM | POA: Diagnosis not present

## 2018-01-12 DIAGNOSIS — K2270 Barrett's esophagus without dysplasia: Secondary | ICD-10-CM | POA: Diagnosis not present

## 2018-01-14 DIAGNOSIS — Z713 Dietary counseling and surveillance: Secondary | ICD-10-CM | POA: Diagnosis not present

## 2018-01-14 DIAGNOSIS — Z23 Encounter for immunization: Secondary | ICD-10-CM | POA: Diagnosis not present

## 2018-01-21 DIAGNOSIS — K644 Residual hemorrhoidal skin tags: Secondary | ICD-10-CM | POA: Diagnosis not present

## 2018-01-21 DIAGNOSIS — K219 Gastro-esophageal reflux disease without esophagitis: Secondary | ICD-10-CM | POA: Diagnosis not present

## 2018-01-21 DIAGNOSIS — K2270 Barrett's esophagus without dysplasia: Secondary | ICD-10-CM | POA: Diagnosis not present

## 2018-01-21 DIAGNOSIS — K259 Gastric ulcer, unspecified as acute or chronic, without hemorrhage or perforation: Secondary | ICD-10-CM | POA: Diagnosis not present

## 2018-02-26 ENCOUNTER — Other Ambulatory Visit: Payer: Self-pay

## 2018-02-26 DIAGNOSIS — J42 Unspecified chronic bronchitis: Secondary | ICD-10-CM

## 2018-02-26 DIAGNOSIS — G44209 Tension-type headache, unspecified, not intractable: Secondary | ICD-10-CM

## 2018-02-26 DIAGNOSIS — F325 Major depressive disorder, single episode, in full remission: Secondary | ICD-10-CM

## 2018-02-26 MED ORDER — BUTALBITAL-APAP-CAFFEINE 50-325-40 MG PO TABS: 1.0000 | ORAL_TABLET | Freq: Four times a day (QID) | ORAL | 0 refills | Status: DC | PRN

## 2018-02-26 MED ORDER — AMPHETAMINE-DEXTROAMPHETAMINE 20 MG PO TABS: 10.0000 mg | ORAL_TABLET | Freq: Two times a day (BID) | ORAL | 0 refills | Status: DC

## 2018-02-26 MED ORDER — ALPRAZOLAM 0.5 MG PO TABS: ORAL_TABLET | ORAL | 0 refills | Status: DC

## 2018-03-03 DIAGNOSIS — K253 Acute gastric ulcer without hemorrhage or perforation: Secondary | ICD-10-CM | POA: Diagnosis not present

## 2018-03-03 DIAGNOSIS — K2270 Barrett's esophagus without dysplasia: Secondary | ICD-10-CM | POA: Diagnosis not present

## 2018-03-09 DIAGNOSIS — Z0000 Encounter for general adult medical examination without abnormal findings: Secondary | ICD-10-CM | POA: Diagnosis not present

## 2018-03-15 ENCOUNTER — Other Ambulatory Visit: Payer: Self-pay | Admitting: Internal Medicine

## 2018-03-16 ENCOUNTER — Other Ambulatory Visit: Payer: Self-pay | Admitting: Adult Health

## 2018-03-16 MED ORDER — ADAPALENE-BENZOYL PEROXIDE 0.1-2.5 % EX GEL: CUTANEOUS | 3 refills | Status: DC

## 2018-04-10 DIAGNOSIS — Z01818 Encounter for other preprocedural examination: Secondary | ICD-10-CM | POA: Diagnosis not present

## 2018-04-10 DIAGNOSIS — K253 Acute gastric ulcer without hemorrhage or perforation: Secondary | ICD-10-CM | POA: Diagnosis not present

## 2018-05-03 ENCOUNTER — Encounter (HOSPITAL_BASED_OUTPATIENT_CLINIC_OR_DEPARTMENT_OTHER): Payer: Self-pay | Admitting: Emergency Medicine

## 2018-05-03 ENCOUNTER — Emergency Department (HOSPITAL_BASED_OUTPATIENT_CLINIC_OR_DEPARTMENT_OTHER)
Admission: EM | Admit: 2018-05-03 | Discharge: 2018-05-03 | Disposition: A | Discharge status: Home / Self Care | Payer: BLUE CROSS/BLUE SHIELD | Attending: Emergency Medicine | Admitting: Emergency Medicine

## 2018-05-03 ENCOUNTER — Other Ambulatory Visit: Payer: Self-pay

## 2018-05-03 ENCOUNTER — Emergency Department (HOSPITAL_BASED_OUTPATIENT_CLINIC_OR_DEPARTMENT_OTHER): Payer: BLUE CROSS/BLUE SHIELD

## 2018-05-03 DIAGNOSIS — Z7722 Contact with and (suspected) exposure to environmental tobacco smoke (acute) (chronic): Secondary | ICD-10-CM | POA: Insufficient documentation

## 2018-05-03 DIAGNOSIS — N23 Unspecified renal colic: Secondary | ICD-10-CM | POA: Insufficient documentation

## 2018-05-03 DIAGNOSIS — N132 Hydronephrosis with renal and ureteral calculous obstruction: Secondary | ICD-10-CM | POA: Diagnosis not present

## 2018-05-03 DIAGNOSIS — Z79899 Other long term (current) drug therapy: Secondary | ICD-10-CM | POA: Insufficient documentation

## 2018-05-03 DIAGNOSIS — R1031 Right lower quadrant pain: Secondary | ICD-10-CM | POA: Diagnosis present

## 2018-05-03 DIAGNOSIS — R109 Unspecified abdominal pain: Secondary | ICD-10-CM | POA: Diagnosis not present

## 2018-05-03 HISTORY — DX: Disorder of kidney and ureter, unspecified: N28.9

## 2018-05-03 LAB — URINALYSIS, ROUTINE W REFLEX MICROSCOPIC
Bilirubin Urine: NEGATIVE
Glucose, UA: NEGATIVE mg/dL
KETONES UR: NEGATIVE mg/dL
Leukocytes, UA: NEGATIVE
NITRITE: NEGATIVE
PH: 6 (ref 5.0–8.0)
Protein, ur: NEGATIVE mg/dL
Specific Gravity, Urine: >1.03 — ABNORMAL HIGH (ref 1.005–1.030)

## 2018-05-03 LAB — COMPREHENSIVE METABOLIC PANEL
ALT: 14 U/L (ref 0–44)
AST: 18 U/L (ref 15–41)
Albumin: 4.1 g/dL (ref 3.5–5.0)
Alkaline Phosphatase: 52 U/L (ref 38–126)
Anion gap: 9 (ref 5–15)
BUN: 15 mg/dL (ref 6–20)
CO2: 26 mmol/L (ref 22–32)
Calcium: 9.1 mg/dL (ref 8.9–10.3)
Chloride: 105 mmol/L (ref 98–111)
Creatinine, Ser: 0.64 mg/dL (ref 0.44–1.00)
GFR calc Af Amer: >60 mL/min (ref >60)
GFR calc non Af Amer: >60 mL/min (ref >60)
Glucose, Bld: 87 mg/dL (ref 70–99)
POTASSIUM: 3.7 mmol/L (ref 3.5–5.1)
Sodium: 140 mmol/L (ref 135–145)
TOTAL PROTEIN: 7.3 g/dL (ref 6.5–8.1)
Total Bilirubin: 0.6 mg/dL (ref 0.3–1.2)

## 2018-05-03 LAB — URINALYSIS, MICROSCOPIC (REFLEX)

## 2018-05-03 MED ORDER — ONDANSETRON 8 MG PO TBDP
8.0000 mg | ORAL_TABLET | Freq: Once | ORAL | Status: AC
  Administered 2018-05-03: 8 mg via ORAL
  Filled 2018-05-03: qty 1

## 2018-05-03 MED ORDER — TAMSULOSIN HCL 0.4 MG PO CAPS: 0.4000 mg | ORAL_CAPSULE | Freq: Every day | ORAL | 0 refills | Status: DC

## 2018-05-03 MED ORDER — HYDROCODONE-ACETAMINOPHEN 5-325 MG PO TABS: 1.0000 | ORAL_TABLET | Freq: Three times a day (TID) | ORAL | 0 refills | Status: AC | PRN

## 2018-05-03 MED ORDER — FENTANYL CITRATE (PF) 100 MCG/2ML IJ SOLN
50.0000 ug | Freq: Once | INTRAMUSCULAR | Status: AC
  Administered 2018-05-03: 50 ug via INTRAVENOUS
  Filled 2018-05-03: qty 2

## 2018-05-03 MED ORDER — METOCLOPRAMIDE HCL 5 MG/ML IJ SOLN: 10.0000 mg | Freq: Once | INTRAMUSCULAR | Status: DC

## 2018-05-03 MED ORDER — KETOROLAC TROMETHAMINE 30 MG/ML IJ SOLN
15.0000 mg | Freq: Once | INTRAMUSCULAR | Status: AC
  Administered 2018-05-03: 15 mg via INTRAVENOUS
  Filled 2018-05-03: qty 1

## 2018-05-03 MED ORDER — IBUPROFEN 400 MG PO TABS: 400.0000 mg | ORAL_TABLET | Freq: Four times a day (QID) | ORAL | 0 refills | Status: AC | PRN

## 2018-05-03 MED ORDER — ACETAMINOPHEN ER 650 MG PO TBCR: 650.0000 mg | EXTENDED_RELEASE_TABLET | Freq: Three times a day (TID) | ORAL | 0 refills | Status: DC | PRN

## 2018-05-03 MED ORDER — ONDANSETRON 8 MG PO TBDP: 8.0000 mg | ORAL_TABLET | Freq: Three times a day (TID) | ORAL | 0 refills | Status: DC | PRN

## 2018-05-03 MED ORDER — HYDROCODONE-ACETAMINOPHEN 5-325 MG PO TABS
1.0000 | ORAL_TABLET | Freq: Once | ORAL | Status: AC
  Administered 2018-05-03: 1 via ORAL
  Filled 2018-05-03: qty 1

## 2018-05-03 NOTE — ED Provider Notes (Signed)
[facility] EMERGENCY DEPARTMENT Provider Note   CSN: [id number] Arrival date & time: 05/03/18  1829     History   Chief Complaint Chief Complaint  Patient presents with  . Flank Pain    HPI Haley Sparks is a 58 y.o. female.  HPI  58 year old female with history of kidney stones, GERD, depression comes in with chief complaint of right-sided flank pain.  Patient states that the pain woke her up earlier this morning.  Pain is described as sharp pain that is constant and radiating towards the groin.  Patient has had associated nausea without vomiting.  The pain reminds her of a kidney stone type pain she had 15 years ago for which she required lithotripsy.  Review of system is negative for any UTI-like symptoms, vaginal discharge or bleeding.  Past Medical History:  Diagnosis Date  . Allergy   . Anxiety   . Barrett's esophagus   . Depression   . GERD (gastroesophageal reflux disease)   . History of bladder surgery   . Miscarriage    twice  . Peptic ulcer disease   . Renal disorder    kidney stones    Patient Active Problem List   Diagnosis Date Noted  . DDD (degenerative disc disease), cervical 03/05/2017  . Liver cyst 06/07/2015  . Barrett's esophagus 03/14/2015  . Hyperlipidemia LDL goal <100 09/12/2014  . History of dysplastic nevus 09/12/2014  . Vitamin D deficiency 09/12/2014  . Gastroesophageal reflux disease with esophagitis 09/12/2014  . Environmental allergies   . Anxiety   . Depression, major, in remission [facility])     Past Surgical History:  Procedure Laterality Date  . ABDOMINAL HYSTERECTOMY  2004   has 1 ovary left, Dr. Corinna Capra  . BLADDER SUSPENSION    . CESAREAN SECTION    . TONSILLECTOMY       OB History   No obstetric history on file.      Home Medications    Prior to Admission medications   Medication Sig Start Date End Date Taking? Authorizing Provider  acetaminophen (TYLENOL 8 HOUR) 650 MG CR tablet Take 1 tablet (650 mg  total) by mouth every 8 (eight) hours as needed for pain or fever. 05/03/18   Varney Biles, MD  Adapalene-Benzoyl Peroxide (EPIDUO) 0.1-2.5 % gel APPLY A SMALL AMOUNT AT BEDTIME AS NEEDED 03/16/18   [name], NP  ALPRAZolam Duanne Moron) 0.5 MG tablet Take 1/2 to 1 tablet 2 to 3 x / day ONLY if needed for Anxiety Attack or Sleep  & please try to limit to 5 days / week to avoid addiction 02/26/18   [name], NP  amphetamine-dextroamphetamine (ADDERALL) 20 MG tablet Take 0.5 tablets (10 mg total) by mouth 2 (two) times daily with a meal. 02/26/18 02/26/19  [name], NP  butalbital-acetaminophen-caffeine (FIORICET, ESGIC) 50-325-40 MG tablet Take 1 tablet by mouth every 6 (six) hours as needed for headache. 02/26/18   [name], NP  estradiol (ESTRACE) 2 MG tablet Take 0.5-1 tablets (1-2 mg total) by mouth daily. Patient taking differently: Take 4 mg by mouth daily.  09/12/15   Vicie Mutters, PA-C  HYDROcodone-acetaminophen (NORCO/VICODIN) 5-325 MG tablet Take 1 tablet by mouth every 8 (eight) hours as needed for up to 3 days for severe pain. 05/03/18 05/06/18  Varney Biles, MD  ibuprofen (ADVIL,MOTRIN) 400 MG tablet Take 1 tablet (400 mg total) by mouth every 6 (six) hours as needed. 05/03/18   Varney Biles, MD  ondansetron (ZOFRAN ODT) 8  MG disintegrating tablet Take 1 tablet (8 mg total) by mouth every 8 (eight) hours as needed for nausea. 05/03/18   Varney Biles, MD  tamsulosin (FLOMAX) 0.4 MG CAPS capsule Take 1 capsule (0.4 mg total) by mouth daily. 05/03/18   Varney Biles, MD  tretinoin (RETIN-A) 0.025 % gel Apply topically at bedtime. 09/12/15   Vicie Mutters, PA-C    Family History Family History  Problem Relation Age of Onset  . Hypertension Mother   . Hypertension Father   . Heart disease Father   . Hyperlipidemia Father   . COPD Maternal Grandmother   . Pulmonary embolism Maternal Grandmother   . Asthma Paternal Grandmother   . Heart disease Paternal  Grandfather     Social History Social History   Tobacco Use  . Smoking status: Passive Smoke Exposure - Never Smoker  . Smokeless tobacco: Never Used  . Tobacco comment: Smoke exposure as a child through parents.  Substance Use Topics  . Alcohol use: Yes    Alcohol/week: 0.0 standard drinks    Comment: wine occ  . Drug use: No     Allergies   Brintellix [vortioxetine]   Review of Systems Review of Systems  Constitutional: Positive for activity change.  Gastrointestinal: Positive for abdominal pain and nausea.  Genitourinary: Positive for flank pain. Negative for dysuria.  Allergic/Immunologic: Negative for immunocompromised state.  Hematological: Does not bruise/bleed easily.  All other systems reviewed and are negative.    Physical Exam Updated Vital Signs BP 116/71 (BP Location: Left Arm)   Pulse 78   Temp 98 F (36.7 C) (Oral)   Resp 18   Ht 5\' 3"  (1.6 m)   Wt 56.7 kg   SpO2 100%   BMI 22.14 kg/m   Physical Exam Vitals signs and nursing note reviewed.  Constitutional:      Appearance: She is well-developed.  HENT:     Head: Normocephalic and atraumatic.  Neck:     Musculoskeletal: Normal range of motion and neck supple.  Cardiovascular:     Rate and Rhythm: Normal rate.  Pulmonary:     Effort: Pulmonary effort is normal.  Abdominal:     General: Bowel sounds are normal.  Skin:    General: Skin is warm and dry.  Neurological:     Mental Status: She is alert and oriented to person, place, and time.      ED Treatments / Results  Labs (all labs ordered are listed, but only abnormal results are displayed) Labs Reviewed  URINALYSIS, ROUTINE W REFLEX MICROSCOPIC - Abnormal; Notable for the following components:      Result Value   APPearance CLOUDY (*)    Specific Gravity, Urine >1.030 (*)    Hgb urine dipstick SMALL (*)    All other components within normal limits  URINALYSIS, MICROSCOPIC (REFLEX) - Abnormal; Notable for the following  components:   Bacteria, UA MANY (*)    All other components within normal limits  COMPREHENSIVE METABOLIC PANEL    EKG None  Radiology Ct Renal Stone Study  Result Date: 05/03/2018 CLINICAL DATA:  Acute onset of right groin pain and back pain. EXAM: CT ABDOMEN AND PELVIS WITHOUT CONTRAST TECHNIQUE: Multidetector CT imaging of the abdomen and pelvis was performed following the standard protocol without IV contrast. COMPARISON:  None available. FINDINGS: Lower chest: The lung bases are clear of acute process. No pleural effusion or pulmonary lesions. The heart is normal in size. No pericardial effusion. The distal esophagus and aorta are  unremarkable. Hepatobiliary: Several benign-appearing hepatic cysts. No worrisome hepatic lesions or intrahepatic biliary dilatation. The gallbladder appears normal. No common bile duct dilatation. Pancreas: No mass, inflammation or ductal dilatation. Spleen: Normal size.  No focal lesions. Adrenals/Urinary Tract: The adrenal glands are normal. Small lower pole left renal calculus but no hydroureteronephrosis. Right-sided hydronephrosis and right hydroureter due to a 2 mm right UVJ calculus. The bladder is unremarkable.  No mass or calculi. Stomach/Bowel: The stomach, duodenum, small bowel and colon are grossly normal without oral contrast. No inflammatory changes, mass lesions or obstructive findings. Low lying cecum deep in the pelvis. Moderate stool throughout the colon and down into the rectum suggesting constipation. Vascular/Lymphatic: The aorta is normal in caliber. No atheroscerlotic calcifications. No mesenteric of retroperitoneal mass or adenopathy. Small scattered lymph nodes are noted. Reproductive: The uterus is surgically absent. I believe the right ovary is still present and appears normal. I do not see the left ovary for certain. Other: No ascites or free pelvic fluid collections. No inguinal mass or adenopathy. Musculoskeletal: No significant bony findings.  IMPRESSION: 1. 2 mm right UVJ calculus causing right-sided hydroureteronephrosis. 2. Small lower pole left renal calculus but no left-sided ureteral calculi and no bladder calculi. 3. Moderate stool throughout the colon suggesting constipation. 4. Simple appearing hepatic cysts. Electronically Signed   By: Marijo Sanes M.D.   On: 05/03/2018 08:00    Procedures Procedures (including critical care time)  Medications Ordered in ED Medications  ketorolac (TORADOL) 30 MG/ML injection 15 mg (15 mg Intravenous Given 05/03/18 0728)  fentaNYL (SUBLIMAZE) injection 50 mcg (50 mcg Intravenous Given 05/03/18 0835)  HYDROcodone-acetaminophen (NORCO/VICODIN) 5-325 MG per tablet 1 tablet (1 tablet Oral Given 05/03/18 0838)  ketorolac (TORADOL) 30 MG/ML injection 15 mg (15 mg Intravenous Given 05/03/18 0942)  ondansetron (ZOFRAN-ODT) disintegrating tablet 8 mg (8 mg Oral Given 05/03/18 0944)     Initial Impression / Assessment and Plan / ED Course  I have reviewed the triage vital signs and the nursing notes.  Pertinent labs & imaging results that were available during my care of the patient were reviewed by me and considered in my medical decision making (see chart for details).  Clinical Course as of May 03 1017  Sun May 03, 2018  0928 Still has persistent pain over the flank.  Patient has now received fentanyl as well.  We will give her another round of Toradol.  Oral challenge has been initiated.  UA has many bacteria without WBCs, unlikely to be UTI.  Urinalysis, Microscopic (reflex)(!) [AN]    Clinical Course User Index [AN] Varney Biles, MD    58 year old female comes in with chief complaint of sudden onset right-sided flank pain.  She does not have any history of pelvic disorders but does have a history of kidney stones.  Clinically this appears to be kidney stone.  CT renal stone ordered as she does not have any recent history of stones and patient would prefer a confirming diagnostic test.  CT  scan does reveal a 2 mm UVJ stone.  Patient has persistent pain despite Toradol.  We will give her some IV fentanyl and reassess.   10:19 AM Pain improved but not resolved. She is comfortable going home with the pain that is about 6 out of 10.  Strict ER return precautions discussed.  P.o. challenge passed. Final Clinical Impressions(s) / ED Diagnoses   Final diagnoses:  Ureteral colic    ED Discharge Orders  Ordered    ondansetron (ZOFRAN ODT) 8 MG disintegrating tablet  Every 8 hours PRN     05/03/18 1018    tamsulosin (FLOMAX) 0.4 MG CAPS capsule  Daily     05/03/18 1018    acetaminophen (TYLENOL 8 HOUR) 650 MG CR tablet  Every 8 hours PRN     05/03/18 1018    HYDROcodone-acetaminophen (NORCO/VICODIN) 5-325 MG tablet  Every 8 hours PRN     05/03/18 1018    ibuprofen (ADVIL,MOTRIN) 400 MG tablet  Every 6 hours PRN     05/03/18 1018           Varney Biles, MD 05/03/18 1019

## 2018-05-03 NOTE — ED Triage Notes (Signed)
C/o sudden onset R groin and flank pain, sharp in nature at 0500 this morning. Pt states hx of kidney stones. Rates pain 8/10, states she is able to void. Reports no sx prior to 0500.

## 2018-05-03 NOTE — Discharge Instructions (Signed)
We saw you in the ER for the abdominal pain. °Our results indicate that you have a kidney stone. °We were able to get your pain is relative control, and we can safely send you home. ° °Take the meds prescribed. °Set up an appointment with the Urologist. °If the pain is unbearable, you start having fevers, chills, and are unable to keep any meds down - then return to the ER. °  °

## 2018-05-03 NOTE — ED Notes (Signed)
Pt up to bathroom with husband for urine sample.

## 2018-05-03 NOTE — ED Notes (Signed)
Patient transported to CT 

## 2018-05-03 NOTE — ED Notes (Signed)
ED Provider at bedside. 

## 2018-05-06 DIAGNOSIS — N201 Calculus of ureter: Secondary | ICD-10-CM | POA: Diagnosis not present

## 2018-05-06 DIAGNOSIS — N132 Hydronephrosis with renal and ureteral calculous obstruction: Secondary | ICD-10-CM | POA: Diagnosis not present

## 2018-05-06 DIAGNOSIS — N23 Unspecified renal colic: Secondary | ICD-10-CM | POA: Diagnosis not present

## 2018-05-21 ENCOUNTER — Ambulatory Visit: Payer: Self-pay | Admitting: Adult Health

## 2018-05-22 DIAGNOSIS — Z713 Dietary counseling and surveillance: Secondary | ICD-10-CM | POA: Diagnosis not present

## 2018-06-29 ENCOUNTER — Other Ambulatory Visit: Payer: Self-pay

## 2018-06-29 DIAGNOSIS — F325 Major depressive disorder, single episode, in full remission: Secondary | ICD-10-CM

## 2018-06-29 DIAGNOSIS — J42 Unspecified chronic bronchitis: Secondary | ICD-10-CM

## 2018-06-29 MED ORDER — ALPRAZOLAM 0.5 MG PO TABS: ORAL_TABLET | ORAL | 0 refills | Status: DC

## 2018-06-29 MED ORDER — AMPHETAMINE-DEXTROAMPHETAMINE 20 MG PO TABS: 10.0000 mg | ORAL_TABLET | Freq: Two times a day (BID) | ORAL | 0 refills | Status: DC

## 2018-07-21 DIAGNOSIS — Z713 Dietary counseling and surveillance: Secondary | ICD-10-CM | POA: Diagnosis not present

## 2018-09-03 DIAGNOSIS — D171 Benign lipomatous neoplasm of skin and subcutaneous tissue of trunk: Secondary | ICD-10-CM | POA: Diagnosis not present

## 2018-09-15 DIAGNOSIS — Z713 Dietary counseling and surveillance: Secondary | ICD-10-CM | POA: Diagnosis not present

## 2018-10-08 DIAGNOSIS — R222 Localized swelling, mass and lump, trunk: Secondary | ICD-10-CM | POA: Diagnosis not present

## 2018-11-10 DIAGNOSIS — Z713 Dietary counseling and surveillance: Secondary | ICD-10-CM | POA: Diagnosis not present

## 2018-11-18 ENCOUNTER — Encounter: Payer: Self-pay | Admitting: Adult Health

## 2018-11-18 NOTE — Progress Notes (Signed)
Complete Physical  Assessment and Plan:   Routine general medical examination at a health care facility   Hyperlipidemia LDL goal <100 - check lipids, decrease fatty foods, increase activity, wants to avoid medications  - low risk, had normal vessels on CTA 2017 - CBC with Differential/Platelet - CMP/GFR - Lipid panel   Vitamin D deficiency - VITAMIN D 25 Hydroxy (Vit-D Deficiency, Fractures)  Medication management - Magnesium  Barrett's esophagus with dysplasia -controlled, continue the same medications, follow up Dr. Collier Salina NEEDS TO STOP GOODY POWDERS  Gastroesophageal reflux disease with esophagitis Continue PPI/H2 blocker, diet discussed, follow up Dr. Collier Salina   Liver cyst Monitor/benign, pt reassured   Dysplastic nevus Continue fu derm   Depression, major, in remission ([place]) - TSH - amphetamine-dextroamphetamine (ADDERALL) 20 MG tablet; Take 0.5 tablets (10 mg total) by mouth 2 (two) times daily with a meal.  Dispense: 60 tablet; Refill: 0  Stress headaches Having daily headaches; AM headaches Possible undiagnosed sleep apnea - declines sleep study  Will try topamax at night Alternately could be tension headache; discussed PT, massage, heat application  Insomnia Insomnia- good sleep hygiene discussed, increase day time activity, try melatonin or benadryl, continue xanax as uses occasional only   Allergy, subsequent encounter - Allegra OTC, increase H20, allergy hygiene explained.  Screening for blood or protein in urine - Urinalysis, Routine w reflex microscopic (not at [facility]) - Microalbumin / creatinine urine ratio  Acne, unspecified acne type - tretinoin (RETIN-A) 0.025 % gel; Apply topically at bedtime.  Dispense: 45 g; Refill: 1 -     Adapalene-Benzoyl Peroxide 0.1-2.5 % gel; Apply small amount at bed time as needed  Anemia, unspecified type -     CBC -     B12  Snoring + AM headaches; advsied sleep study, she adamantly declines despite discussion of  risks of untreated OSA; encouraged to discuss with dentist for possible oral appliance   Discussed med's effects and SE's. Screening labs and tests as requested with regular follow-up as recommended. Over 40 minutes of exam, counseling, chart review, and complex, high level critical decision making was performed this visit.   Future Appointments  Date Time Provider [place]  11/22/2019  9:00 AM [name], NP [facility] None     HPI  58 y.o. female  presents for a complete physical. She has Environmental allergies; Anxiety; Depression, major, in remission ([place]); Stress headaches; Hyperlipidemia LDL goal <100; History of dysplastic nevus; Vitamin D deficiency; Gastroesophageal reflux disease with esophagitis; Barrett's esophagus; and DDD (degenerative disc disease), cervical on their problem list.   She is married, 2 daughters, no grandkids yet. She works as a Psychologist, counselling. Youngest daughter will be getting married soon.   She is followed by GYN Dr. Corinna Capra; She has had TAH and has osteopenia, on estrogen for osteopenia as well as hot flashes, on bASA.   She had some pseudodementia with normal testing after her grandmother passed, no issues since.   She has headaches associated with stress; having nearly daily and admits to using goody powders. She does report she snores at night, wakes up with headaches frequently. She adamantly declines sleep study today.   Has hx of GERD with Barrett's esophagus, followed by Dr. Collier Salina, reports did have ulcers recently. She is on dexilant 60 mg daily.   Has xanax but rarely uses during the day, typically takes to sleep at night. Hasn't needed in a a while. Has tried gabapentin before and had too much fogginess.   She  takes 1/2 tab adderall once daily, rarely second tab in hour if running late, takes Mon-Fri without SE.   She has history of dysplastic nevus, and follows with Dr. Ubaldo Glassing.   She has neck pain, arthritis, with intermittent R  radicular symptoms, following with neurology and getting shots. Too soon for surgery.   BMI is Body mass index is 22.49 kg/m., she has been working on diet and exercise, though not as much since working from home, [place] in [place] last month feeling much better mentally doing this, 1-2 miles daily.  Wt Readings from Last 3 Encounters:  11/19/18 131 lb (59.4 kg)  05/03/18 125 lb (56.7 kg)  11/18/17 131 lb (59.4 kg)   Her blood pressure has been controlled at home, today their BP is BP: 114/72 She does workout, walks daily. She denies chest pain, shortness of breath, dizziness.  She had CTA in 2017 which showed normal caliber arteries and no atherosclerosis  She is not on cholesterol medication and denies myalgias. Her cholesterol is not at goal. The cholesterol last visit was:   Lab Results  Component Value Date   CHOL 233 (H) 11/18/2017   HDL 92 11/18/2017   LDLCALC 126 (H) 11/18/2017   TRIG 60 11/18/2017   CHOLHDL 2.5 11/18/2017   Last A1C in the office was:  Lab Results  Component Value Date   HGBA1C 4.9 11/18/2017   Lab Results  Component Value Date   GFRNONAA >60 05/03/2018   Patient is not on Vitamin D supplement.   Lab Results  Component Value Date   VD25OH 37 11/18/2017      Lab Results  Component Value Date   [id number] 09/12/2016      Current Medications:  Current Outpatient Medications on File Prior to Visit  Medication Sig Dispense Refill  . acetaminophen (TYLENOL 8 HOUR) 650 MG CR tablet Take 1 tablet (650 mg total) by mouth every 8 (eight) hours as needed for pain or fever. 30 tablet 0  . Adapalene-Benzoyl Peroxide (EPIDUO) 0.1-2.5 % gel APPLY A SMALL AMOUNT AT BEDTIME AS NEEDED 45 g 3  . ALPRAZolam (XANAX) 0.5 MG tablet Take 1/2 to 1 tablet 2 to 3 x / day ONLY if needed for Anxiety Attack or Sleep  & please try to limit to 5 days / week to avoid addiction 60 tablet 0  . amphetamine-dextroamphetamine (ADDERALL) 20 MG tablet  Take 0.5 tablets (10 mg total) by mouth 2 (two) times daily with a meal. 60 tablet 0  . dexlansoprazole (DEXILANT) 60 MG capsule Take 60 mg by mouth daily.    Marland Kitchen estradiol (ESTRACE) 2 MG tablet Take 0.5-1 tablets (1-2 mg total) by mouth daily. (Patient taking differently: Take 4 mg by mouth daily. ) 90 tablet 6  . ibuprofen (ADVIL,MOTRIN) 400 MG tablet Take 1 tablet (400 mg total) by mouth every 6 (six) hours as needed. 30 tablet 0  . ondansetron (ZOFRAN ODT) 8 MG disintegrating tablet Take 1 tablet (8 mg total) by mouth every 8 (eight) hours as needed for nausea. 20 tablet 0  . tretinoin (RETIN-A) 0.025 % gel Apply topically at bedtime. 45 g 1  . butalbital-acetaminophen-caffeine (FIORICET, ESGIC) 50-325-40 MG tablet Take 1 tablet by mouth every 6 (six) hours as needed for headache. (Patient not taking: Reported on 11/19/2018) 30 tablet 0  . tamsulosin (FLOMAX) 0.4 MG CAPS capsule Take 1 capsule (0.4 mg total) by mouth daily. 10 capsule 0   No current facility-administered medications  on file prior to visit.    Allergies:  Allergies  Allergen Reactions  . Brintellix [Vortioxetine] Nausea Only   Medical History:  Shehas Environmental allergies; Anxiety; Depression, major, in remission ([place]); Stress headaches; Hyperlipidemia LDL goal <100; History of dysplastic nevus; Vitamin D deficiency; Gastroesophageal reflux disease with esophagitis; Barrett's esophagus; and DDD (degenerative disc disease), cervical on their problem list.   Health Maintenance:   Immunization History  Administered Date(s) Administered  . Influenza Split 12/25/2012, 12/20/2014  . Tdap 09/04/2012    Tetanus: 2014 Pneumovax: N/A Prevnar 13:  N/A Flu vaccine: 2016 Zostavax: N/A  LMP TAH Pap: 2018 Dr. Corinna Capra MGM: 04/2016 gets with Dr. Corinna Capra, wants to schedule with breast center - ordered DEXA: 2018 osteopenia Dr. Corinna Capra Colonoscopy: Aug 2016 with Dr. Claretta [facility] [place] EGD: 03/2018, + gastritis/ulcer still,  - on dexilant CXR 2012 CT AB 2001 CT renal 04/2018 CTA chest negative 2017 PFTs 2017 normal, Dr. Ashok Cordia  Vision: Dr. Jerline Pain, [place], 12/2017 Dental: Dr. Randol Kern, last 2019, goes q63m, has upcoming   Patient Care Team: Unk Pinto, MD as PCP - General (Internal Medicine) Donell Beers, MD as Referring Physician (Orthopedic Surgery) Melrose Nakayama, MD as Consulting Physician (Orthopedic Surgery) Coral Spikes, MD (Unknown Physician Specialty)  Surgical History:  She  has a past surgical history that includes Abdominal hysterectomy (2004); Cesarean section; Bladder suspension; and Tonsillectomy. Family History:  She had a family history includes Asthma in her paternal grandmother; COPD in her maternal grandmother; Heart disease in her father and paternal grandfather; Hyperlipidemia in her father; Hypertension in her father and mother; Pulmonary embolism in her maternal grandmother. Social History:  She  reports that she is a non-smoker but has been exposed to tobacco smoke. She has never used smokeless tobacco. She reports current alcohol use. She reports that she does not use drugs.  Review of Systems: Review of Systems  Constitutional: Negative.   HENT: Negative.   Eyes: Negative.   Respiratory: Negative.   Cardiovascular: Negative.   Gastrointestinal: Positive for heartburn (intermittent, after trigger foods). Negative for abdominal pain, blood in stool, constipation, diarrhea, melena, nausea and vomiting.  Genitourinary: Negative for dysuria, flank pain, frequency, hematuria and urgency.  Musculoskeletal: Positive for neck pain (at night, some intermittent right radicular).  Skin: Negative.   Neurological: Positive for headaches (nearly daily; frequently AM).  Endo/Heme/Allergies: Negative.   Psychiatric/Behavioral: Negative for depression, hallucinations, memory loss, substance abuse and suicidal ideas. The patient is not nervous/anxious and does not have insomnia.      Physical Exam: Estimated body mass index is 22.49 kg/m as calculated from the following:   Height as of this encounter: 5\' 4"  (1.626 m).   Weight as of this encounter: 131 lb (59.4 kg). BP 114/72   Pulse 77   Temp (!) 97 F (36.1 C)   Ht 5\' 4"  (1.626 m)   Wt 131 lb (59.4 kg)   SpO2 99%   BMI 22.49 kg/m  General Appearance: Well nourished, in no apparent distress.  Eyes: PERRLA, EOMs, conjunctiva no swelling or erythema, normal fundi and vessels.  Sinuses: No Frontal/maxillary tenderness  ENT/Mouth: Ext aud canals clear, normal light reflex with TMs without erythema, bulging. Good dentition. No erythema, swelling, or exudate on post pharynx. Tonsils not swollen or erythematous. Hearing normal. + TMJ Neck: Supple, thyroid normal. No bruits  Respiratory: Respiratory effort normal, BS equal bilaterally without rales, rhonchi, wheezing or stridor.  Cardio: RRR without murmurs, rubs or gallops. Brisk peripheral pulses  without edema.  Chest: symmetric, with normal excursions and percussion.  Breasts: defer to GYN Abdomen: Soft, nontender, no guarding, rebound, hernias, masses, or organomegaly.  Lymphatics: Non tender without lymphadenopathy.  Genitourinary: defer to GYN Musculoskeletal: Full ROM all peripheral extremities,5/5 strength, and normal gait., normal distal neuro Skin: Warm, dry without rashes, lesions, ecchymosis. Neuro: Cranial nerves intact, reflexes equal bilaterally. Normal muscle tone, no cerebellar symptoms. Sensation intact.  Psych: Awake and oriented X 3, normal affect, Insight and Judgment appropriate.   EKG: WNL, NSCPT  Gorden Harms Patricio Popwell 3:20 PM [facility]

## 2018-11-19 ENCOUNTER — Encounter: Payer: Self-pay | Admitting: Adult Health

## 2018-11-19 ENCOUNTER — Other Ambulatory Visit: Payer: Self-pay

## 2018-11-19 ENCOUNTER — Ambulatory Visit: Payer: BC Managed Care – PPO | Admitting: Adult Health

## 2018-11-19 VITALS — BP 114/72 | HR 77 | Temp 97.0°F | Ht 64.0 in | Wt 131.0 lb

## 2018-11-19 DIAGNOSIS — F325 Major depressive disorder, single episode, in full remission: Secondary | ICD-10-CM

## 2018-11-19 DIAGNOSIS — Z8249 Family history of ischemic heart disease and other diseases of the circulatory system: Secondary | ICD-10-CM | POA: Diagnosis not present

## 2018-11-19 DIAGNOSIS — J42 Unspecified chronic bronchitis: Secondary | ICD-10-CM

## 2018-11-19 DIAGNOSIS — Z79899 Other long term (current) drug therapy: Secondary | ICD-10-CM

## 2018-11-19 DIAGNOSIS — Z136 Encounter for screening for cardiovascular disorders: Secondary | ICD-10-CM | POA: Diagnosis not present

## 2018-11-19 DIAGNOSIS — I10 Essential (primary) hypertension: Secondary | ICD-10-CM

## 2018-11-19 DIAGNOSIS — K7689 Other specified diseases of liver: Secondary | ICD-10-CM

## 2018-11-19 DIAGNOSIS — Z1329 Encounter for screening for other suspected endocrine disorder: Secondary | ICD-10-CM

## 2018-11-19 DIAGNOSIS — M5030 Other cervical disc degeneration, unspecified cervical region: Secondary | ICD-10-CM

## 2018-11-19 DIAGNOSIS — Z1389 Encounter for screening for other disorder: Secondary | ICD-10-CM | POA: Diagnosis not present

## 2018-11-19 DIAGNOSIS — E559 Vitamin D deficiency, unspecified: Secondary | ICD-10-CM

## 2018-11-19 DIAGNOSIS — Z6822 Body mass index (BMI) 22.0-22.9, adult: Secondary | ICD-10-CM

## 2018-11-19 DIAGNOSIS — Z86018 Personal history of other benign neoplasm: Secondary | ICD-10-CM

## 2018-11-19 DIAGNOSIS — Z0000 Encounter for general adult medical examination without abnormal findings: Secondary | ICD-10-CM

## 2018-11-19 DIAGNOSIS — K2100 Gastro-esophageal reflux disease with esophagitis, without bleeding: Secondary | ICD-10-CM

## 2018-11-19 DIAGNOSIS — Z13220 Encounter for screening for lipoid disorders: Secondary | ICD-10-CM

## 2018-11-19 DIAGNOSIS — E785 Hyperlipidemia, unspecified: Secondary | ICD-10-CM

## 2018-11-19 DIAGNOSIS — K22719 Barrett's esophagus with dysplasia, unspecified: Secondary | ICD-10-CM

## 2018-11-19 DIAGNOSIS — Z1239 Encounter for other screening for malignant neoplasm of breast: Secondary | ICD-10-CM

## 2018-11-19 DIAGNOSIS — D649 Anemia, unspecified: Secondary | ICD-10-CM

## 2018-11-19 DIAGNOSIS — Z9109 Other allergy status, other than to drugs and biological substances: Secondary | ICD-10-CM

## 2018-11-19 DIAGNOSIS — Z130 Encounter for screening for diseases of the blood and blood-forming organs and certain disorders involving the immune mechanism: Secondary | ICD-10-CM

## 2018-11-19 DIAGNOSIS — F419 Anxiety disorder, unspecified: Secondary | ICD-10-CM

## 2018-11-19 DIAGNOSIS — F4541 Pain disorder exclusively related to psychological factors: Secondary | ICD-10-CM

## 2018-11-19 MED ORDER — ALPRAZOLAM 0.5 MG PO TABS: ORAL_TABLET | ORAL | 0 refills | Status: DC

## 2018-11-19 MED ORDER — AMPHETAMINE-DEXTROAMPHETAMINE 20 MG PO TABS: 10.0000 mg | ORAL_TABLET | Freq: Two times a day (BID) | ORAL | 0 refills | Status: DC

## 2018-11-19 MED ORDER — TOPIRAMATE 25 MG PO TABS: ORAL_TABLET | ORAL | 2 refills | Status: DC

## 2018-11-19 NOTE — Patient Instructions (Addendum)
HOW TO SCHEDULE A MAMMOGRAM  [facility]  7 a.m.-6:30 p.m., Monday 7 a.m.-5 p.m., Tuesday-Friday Schedule an appointment by calling [phone].   Try topamax 25-50 mg at night for headache prevention;   Ideally want to limit fioricet, excedrine, etc to <10 per month  Could be undiagnosed sleep apnea - discuss snoring with dentist  Could be tension headaches - related to neck - physical therapy could help  Try applying heat to neck   Consider a magnesium supplement - 250 mg daily   Topiramate tablets What is this medicine? TOPIRAMATE (toe PYRE a mate) is used to treat seizures in adults or children with epilepsy. It is also used for the prevention of migraine headaches. This medicine may be used for other purposes; ask your health care provider or pharmacist if you have questions. COMMON BRAND NAME(S): Topamax, Topiragen What should I tell my health care provider before I take this medicine? They need to know if you have any of these conditions:  bleeding disorders  cirrhosis of the liver or liver disease  diarrhea  glaucoma  kidney stones or kidney disease  low blood counts, like low white cell, platelet, or red cell counts  lung disease like asthma, obstructive pulmonary disease, emphysema  metabolic acidosis  on a ketogenic diet  schedule for surgery or a procedure  suicidal thoughts, plans, or attempt; a previous suicide attempt by you or a family member  an unusual or allergic reaction to topiramate, other medicines, foods, dyes, or preservatives  pregnant or trying to get pregnant  breast-feeding How should I use this medicine? Take this medicine by mouth with a glass of water. Follow the directions on the prescription label. Do not crush or chew. You may take this medicine with meals. Take your medicine at regular intervals. Do not take it more often than directed. Talk to your pediatrician regarding the use of  this medicine in children. Special care may be needed. While this drug may be prescribed for children as young as 33 years of age for selected conditions, precautions do apply. Overdosage: If you think you have taken too much of this medicine contact a poison control center or emergency room at once. NOTE: This medicine is only for you. Do not share this medicine with others. What if I miss a dose? If you miss a dose, take it as soon as you can. If your next dose is to be taken in less than 6 hours, then do not take the missed dose. Take the next dose at your regular time. Do not take double or extra doses. What may interact with this medicine? Do not take this medicine with any of the following medications:  probenecid This medicine may also interact with the following medications:  acetazolamide  alcohol  amitriptyline  aspirin and aspirin-like medicines  birth control pills  certain medicines for depression  certain medicines for seizures  certain medicines that treat or prevent blood clots like warfarin, enoxaparin, dalteparin, apixaban, dabigatran, and rivaroxaban  digoxin  hydrochlorothiazide  lithium  medicines for pain, sleep, or muscle relaxation  metformin  methazolamide  NSAIDS, medicines for pain and inflammation, like ibuprofen or naproxen  pioglitazone  risperidone This list may not describe all possible interactions. Give your health care provider a list of all the medicines, herbs, non-prescription drugs, or dietary supplements you use. Also tell them if you smoke, drink alcohol, or use illegal drugs. Some items may interact  with your medicine. What should I watch for while using this medicine? Visit your doctor or health care professional for regular checks on your progress. Do not stop taking this medicine suddenly. This increases the risk of seizures if you are using this medicine to control epilepsy. Wear a medical identification bracelet or chain to  say you have epilepsy or seizures, and carry a card that lists all your medicines. This medicine can decrease sweating and increase your body temperature. Watch for signs of deceased sweating or fever, especially in children. Avoid extreme heat, hot baths, and saunas. Be careful about exercising, especially in hot weather. Contact your health care provider right away if you notice a fever or decrease in sweating. You should drink plenty of fluids while taking this medicine. If you have had kidney stones in the past, this will help to reduce your chances of forming kidney stones. If you have stomach pain, with nausea or vomiting and yellowing of your eyes or skin, call your doctor immediately. You may get drowsy, dizzy, or have blurred vision. Do not drive, use machinery, or do anything that needs mental alertness until you know how this medicine affects you. To reduce dizziness, do not sit or stand up quickly, especially if you are an older patient. Alcohol can increase drowsiness and dizziness. Avoid alcoholic drinks. If you notice blurred vision, eye pain, or other eye problems, seek medical attention at once for an eye exam. The use of this medicine may increase the chance of suicidal thoughts or actions. Pay special attention to how you are responding while on this medicine. Any worsening of mood, or thoughts of suicide or dying should be reported to your health care professional right away. This medicine may increase the chance of developing metabolic acidosis. If left untreated, this can cause kidney stones, bone disease, or slowed growth in children. Symptoms include breathing fast, fatigue, loss of appetite, irregular heartbeat, or loss of consciousness. Call your doctor immediately if you experience any of these side effects. Also, tell your doctor about any surgery you plan on having while taking this medicine since this may increase your risk for metabolic acidosis. Birth control pills may not  work properly while you are taking this medicine. Talk to your doctor about using an extra method of birth control. Women who become pregnant while using this medicine may enroll in the [place] Pregnancy Registry by calling [phone]. This registry collects information about the safety of antiepileptic drug use during pregnancy. What side effects may I notice from receiving this medicine? Side effects that you should report to your doctor or health care professional as soon as possible:  allergic reactions like skin rash, itching or hives, swelling of the face, lips, or tongue  decreased sweating and/or rise in body temperature  depression  difficulty breathing, fast or irregular breathing patterns  difficulty speaking  difficulty walking or controlling muscle movements  hearing impairment  redness, blistering, peeling or loosening of the skin, including inside the mouth  tingling, pain or numbness in the hands or feet  unusual bleeding or bruising  unusually weak or tired  worsening of mood, thoughts or actions of suicide or dying Side effects that usually do not require medical attention (report to your doctor or health care professional if they continue or are bothersome):  altered taste  back pain, joint or muscle aches and pains  diarrhea, or constipation  headache  loss of appetite  nausea  stomach upset, indigestion  tremors  This list may not describe all possible side effects. Call your doctor for medical advice about side effects. You may report side effects to FDA at 1-800-FDA-1088. Where should I keep my medicine? Keep out of the reach of children. Store at room temperature between 15 and 30 degrees C (59 and 86 degrees F) in a tightly closed container. Protect from moisture. Throw away any unused medicine after the expiration date. NOTE: This sheet is a summary. It may not cover all possible information. If you have questions  about this medicine, talk to your doctor, pharmacist, or health care provider.  2020 Elsevier/Gold Standard (2013-03-15 23:17:57)     Tension Headache, Adult A tension headache is a feeling of pain, pressure, or aching in the head that is often felt over the front and sides of the head. The pain can be dull, or it can feel tight (constricting). There are two types of tension headache:  Episodic tension headache. This is when the headaches happen fewer than 15 days a month.  Chronic tension headache. This is when the headaches happen more than 15 days a month during a 32-month period. A tension headache can last from 30 minutes to several days. It is the most common kind of headache. Tension headaches are not normally associated with nausea or vomiting, and they do not get worse with physical activity. What are the causes? The exact cause of this condition is not known. Tension headaches are often triggered by stress, anxiety, or depression. Other triggers include:  Alcohol.  Too much caffeine or caffeine withdrawal.  Respiratory infections, such as colds, flu, or sinus infections.  Dental problems or teeth clenching.  Tiredness (fatigue).  Holding your head and neck in the same position for a long period of time, such as while using a computer.  Smoking.  Arthritis of the neck. What are the signs or symptoms? Symptoms of this condition include:  A feeling of pressure or tightness around the head.  Dull, aching head pain.  Pain over the front and sides of the head.  Tenderness in the muscles of the head, neck, and shoulders. How is this diagnosed? This condition may be diagnosed based on your symptoms, your medical history, and a physical exam. If your symptoms are severe or unusual, you may have imaging tests, such as a CT scan or an MRI of your head. Your vision may also be checked. How is this treated? This condition may be treated with lifestyle changes and with  medicines that help relieve symptoms. Follow these instructions at home: Managing pain  Take over-the-counter and prescription medicines only as told by your health care provider.  When you have a headache, lie down in a dark, quiet room.  If directed, apply ice to the head and neck: ? Put ice in a plastic bag. ? Place a towel between your skin and the bag. ? Leave the ice on for 20 minutes, 2-3 times a day.  If directed, apply heat to the back of your neck as often as told by your health care provider. Use the heat source that your health care provider recommends, such as a moist heat pack or a heating pad. ? Place a towel between your skin and the heat source. ? Leave the heat on for 20-30 minutes. ? Remove the heat if your skin turns bright red. This is especially important if you are unable to feel pain, heat, or cold. You may have a greater risk of getting burned. Eating and drinking  Eat meals on a regular schedule.  Limit alcohol intake to no more than 1 drink a day for nonpregnant women and 2 drinks a day for men. One drink equals 12 oz of beer, 5 oz of wine, or 1 oz of hard liquor.  Drink enough fluid to keep your urine pale yellow.  Decrease your caffeine intake, or stop using caffeine. Lifestyle  Get 7-9 hours of sleep each night, or get the amount of sleep recommended by your health care provider.  At bedtime, remove all electronic devices from your room. Electronic devices include computers, phones, and tablets.  Find ways to manage your stress. Some things that can help relieve stress include: ? Exercise. ? Deep breathing exercises. ? Yoga. ? Listening to music. ? Positive mental imagery.  Try to sit up straight and avoid tensing your muscles.  Do not use any products that contain nicotine or tobacco, such as cigarettes and e-cigarettes. If you need help quitting, ask your health care provider. General instructions   Keep all follow-up visits as told by  your health care provider. This is important.  Avoid any headache triggers. Keep a headache journal to help find out what may trigger your headaches. For example, write down: ? What you eat and drink. ? How much sleep you get. ? Any change to your diet or medicines. Contact a health care provider if:  Your headache does not get better.  Your headache comes back.  You are sensitive to sounds, light, or smells because of a headache.  You have nausea or you vomit.  Your stomach hurts. Get help right away if:  You suddenly develop a very severe headache along with any of the following: ? A stiff neck. ? Nausea and vomiting. ? Confusion. ? Weakness. ? Double vision or loss of vision. ? Shortness of breath. ? Rash. ? Unusual sleepiness. ? Fever. ? Trouble speaking. ? Pain in your eyes or ears. ? Trouble walking or balancing. ? Feeling faint or passing out. Summary  A tension headache is a feeling of pain, pressure, or aching in the head that is often felt over the front and sides of the head.  A tension headache can last from 30 minutes to several days. It is the most common kind of headache.  This condition may be diagnosed based on your symptoms, your medical history, and a physical exam.  This condition may be treated with lifestyle changes and with medicines that help relieve symptoms. This information is not intended to replace advice given to you by your health care provider. Make sure you discuss any questions you have with your health care provider. Document Released: 03/11/2005 Document Revised: 02/21/2017 Document Reviewed: 06/21/2016 Elsevier Patient Education  [employer].       I think it is possible that you have sleep apnea. It can cause interrupted sleep, headaches, frequent awakenings, fatigue, dry mouth, fast/slow heart beats, memory issues, anxiety/depression, swelling, numbness tingling hands/feet, weight gain, shortness of breath, and the list  goes on. Sleep apnea needs to be ruled out because if it is left untreated it does eventually lead to abnormal heart beats, lung failure or heart failure as well as increasing the risk of heart attack and stroke. There are masks you can wear OR a mouth piece that I can give you information about. Often times though people feel MUCH better after getting treatment.   Sleep Apnea  Sleep apnea is a sleep disorder characterized by abnormal pauses in breathing while you  sleep. When your breathing pauses, the level of oxygen in your blood decreases. This causes you to move out of deep sleep and into light sleep. As a result, your quality of sleep is poor, and the system that carries your blood throughout your body (cardiovascular system) experiences stress. If sleep apnea remains untreated, the following conditions can develop:  High blood pressure (hypertension).  Coronary artery disease.  Inability to achieve or maintain an erection (impotence).  Impairment of your thought process (cognitive dysfunction). There are three types of sleep apnea: 1. Obstructive sleep apnea--Pauses in breathing during sleep because of a blocked airway. 2. Central sleep apnea--Pauses in breathing during sleep because the area of the brain that controls your breathing does not send the correct signals to the muscles that control breathing. 3. Mixed sleep apnea--A combination of both obstructive and central sleep apnea.  RISK FACTORS The following risk factors can increase your risk of developing sleep apnea:  Being overweight.  Smoking.  Having narrow passages in your nose and throat.  Being of older age.  Being female.  Alcohol use.  Sedative and tranquilizer use.  Ethnicity. Among individuals younger than 35 years, African Americans are at increased risk of sleep apnea. SYMPTOMS   Difficulty staying asleep.  Daytime sleepiness and fatigue.  Loss of energy.  Irritability.  Loud, heavy snoring.   Morning headaches.  Trouble concentrating.  Forgetfulness.  Decreased interest in sex. DIAGNOSIS  In order to diagnose sleep apnea, your caregiver will perform a physical examination. Your caregiver may suggest that you take a home sleep test. Your caregiver may also recommend that you spend the night in a sleep lab. In the sleep lab, several monitors record information about your heart, lungs, and brain while you sleep. Your leg and arm movements and blood oxygen level are also recorded. TREATMENT The following actions may help to resolve mild sleep apnea:  Sleeping on your side.   Using a decongestant if you have nasal congestion.   Avoiding the use of depressants, including alcohol, sedatives, and narcotics.   Losing weight and modifying your diet if you are overweight. There also are devices and treatments to help open your airway:  Oral appliances. These are custom-made mouthpieces that shift your lower jaw forward and slightly open your bite. This opens your airway.  Devices that create positive airway pressure. This positive pressure "splints" your airway open to help you breathe better during sleep. The following devices create positive airway pressure:  Continuous positive airway pressure (CPAP) device. The CPAP device creates a continuous level of air pressure with an air pump. The air is delivered to your airway through a mask while you sleep. This continuous pressure keeps your airway open.  Nasal expiratory positive airway pressure (EPAP) device. The EPAP device creates positive air pressure as you exhale. The device consists of single-use valves, which are inserted into each nostril and held in place by adhesive. The valves create very little resistance when you inhale but create much more resistance when you exhale. That increased resistance creates the positive airway pressure. This positive pressure while you exhale keeps your airway open, making it easier to breath  when you inhale again.  Bilevel positive airway pressure (BPAP) device. The BPAP device is used mainly in patients with central sleep apnea. This device is similar to the CPAP device because it also uses an air pump to deliver continuous air pressure through a mask. However, with the BPAP machine, the pressure is set at two different  levels. The pressure when you exhale is lower than the pressure when you inhale.  Surgery. Typically, surgery is only done if you cannot comply with less invasive treatments or if the less invasive treatments do not improve your condition. Surgery involves removing excess tissue in your airway to create a wider passage way. Document Released: 03/01/2002 Document Revised: 07/06/2012 Document Reviewed: 07/18/2011 [facility] Patient Information 2015 [place], [place]. This information is not intended to replace advice given to you by your health care provider. Make sure you discuss any questions you have with your health care provider.

## 2018-11-20 LAB — LIPID PANEL
Cholesterol: 235 mg/dL — ABNORMAL HIGH (ref <200)
HDL: 95 mg/dL (ref >=50)
LDL Cholesterol (Calc): 123 mg/dL (calc) — ABNORMAL HIGH
Non-HDL Cholesterol (Calc): 140 mg/dL (calc) — ABNORMAL HIGH (ref <130)
Total CHOL/HDL Ratio: 2.5 (calc) (ref <5.0)
Triglycerides: 76 mg/dL (ref <150)

## 2018-11-20 LAB — URINALYSIS, ROUTINE W REFLEX MICROSCOPIC
Bilirubin Urine: NEGATIVE
Glucose, UA: NEGATIVE
Hgb urine dipstick: NEGATIVE
Ketones, ur: NEGATIVE
Leukocytes,Ua: NEGATIVE
Nitrite: NEGATIVE
Protein, ur: NEGATIVE
Specific Gravity, Urine: 1.01 (ref 1.001–1.03)
pH: 6 (ref 5.0–8.0)

## 2018-11-20 LAB — COMPLETE METABOLIC PANEL WITH GFR
AG Ratio: 1.9 (calc) (ref 1.0–2.5)
ALT: 13 U/L (ref 6–29)
AST: 18 U/L (ref 10–35)
Albumin: 4.7 g/dL (ref 3.6–5.1)
Alkaline phosphatase (APISO): 62 U/L (ref 37–153)
BUN: 16 mg/dL (ref 7–25)
CO2: 29 mmol/L (ref 20–32)
Calcium: 9.8 mg/dL (ref 8.6–10.4)
Chloride: 103 mmol/L (ref 98–110)
Creat: 0.67 mg/dL (ref 0.50–1.05)
GFR, Est African American: 114 mL/min/{1.73_m2} (ref >=60)
GFR, Est Non African American: 98 mL/min/{1.73_m2} (ref >=60)
Globulin: 2.5 g/dL (calc) (ref 1.9–3.7)
Glucose, Bld: 86 mg/dL (ref 65–99)
Potassium: 3.9 mmol/L (ref 3.5–5.3)
Sodium: 140 mmol/L (ref 135–146)
Total Bilirubin: 0.7 mg/dL (ref 0.2–1.2)
Total Protein: 7.2 g/dL (ref 6.1–8.1)

## 2018-11-20 LAB — CBC WITH DIFFERENTIAL/PLATELET
Absolute Monocytes: 821 cells/uL (ref 200–950)
Basophils Absolute: 38 cells/uL (ref 0–200)
Basophils Relative: 0.5 %
Eosinophils Absolute: 213 cells/uL (ref 15–500)
Eosinophils Relative: 2.8 %
HCT: 37.7 % (ref 35.0–45.0)
Hemoglobin: 12.8 g/dL (ref 11.7–15.5)
Lymphs Abs: 2500 cells/uL (ref 850–3900)
MCH: 33 pg (ref 27.0–33.0)
MCHC: 34 g/dL (ref 32.0–36.0)
MCV: 97.2 fL (ref 80.0–100.0)
MPV: 9.9 fL (ref 7.5–12.5)
Monocytes Relative: 10.8 %
Neutro Abs: 4028 cells/uL (ref 1500–7800)
Neutrophils Relative %: 53 %
Platelets: 287 10*3/uL (ref 140–400)
RBC: 3.88 10*6/uL (ref 3.80–5.10)
RDW: 12.1 % (ref 11.0–15.0)
Total Lymphocyte: 32.9 %
WBC: 7.6 10*3/uL (ref 3.8–10.8)

## 2018-11-20 LAB — VITAMIN B12: Vitamin B-12: 424 pg/mL (ref 200–1100)

## 2018-11-20 LAB — VITAMIN D 25 HYDROXY (VIT D DEFICIENCY, FRACTURES): Vit D, 25-Hydroxy: 37 ng/mL (ref 30–100)

## 2018-11-20 LAB — MAGNESIUM: Magnesium: 2 mg/dL (ref 1.5–2.5)

## 2018-11-20 LAB — TSH: TSH: 0.82 mIU/L (ref 0.40–4.50)

## 2019-01-07 ENCOUNTER — Ambulatory Visit
Admission: RE | Admit: 2019-01-07 | Discharge: 2019-01-07 | Disposition: A | Discharge status: Home / Self Care | Payer: BC Managed Care – PPO | Source: Ambulatory Visit | Attending: Adult Health | Admitting: Adult Health

## 2019-01-07 ENCOUNTER — Other Ambulatory Visit: Payer: Self-pay

## 2019-01-07 DIAGNOSIS — Z1239 Encounter for other screening for malignant neoplasm of breast: Secondary | ICD-10-CM

## 2019-01-07 DIAGNOSIS — Z1231 Encounter for screening mammogram for malignant neoplasm of breast: Secondary | ICD-10-CM | POA: Diagnosis not present

## 2019-01-26 ENCOUNTER — Encounter: Payer: Self-pay | Admitting: Internal Medicine

## 2019-01-27 ENCOUNTER — Other Ambulatory Visit: Payer: Self-pay

## 2019-01-27 ENCOUNTER — Other Ambulatory Visit: Payer: Self-pay | Admitting: Adult Health

## 2019-01-27 DIAGNOSIS — J42 Unspecified chronic bronchitis: Secondary | ICD-10-CM

## 2019-01-27 MED ORDER — ADAPALENE-BENZOYL PEROXIDE 0.1-2.5 % EX GEL: CUTANEOUS | 3 refills | Status: DC

## 2019-01-27 MED ORDER — ESTRADIOL 2 MG PO TABS: 2.0000 mg | ORAL_TABLET | Freq: Every day | ORAL | 3 refills | Status: DC

## 2019-01-27 MED ORDER — ALPRAZOLAM 0.5 MG PO TABS: ORAL_TABLET | ORAL | 0 refills | Status: DC

## 2019-04-15 DIAGNOSIS — D171 Benign lipomatous neoplasm of skin and subcutaneous tissue of trunk: Secondary | ICD-10-CM | POA: Diagnosis not present

## 2019-04-26 DIAGNOSIS — Z4802 Encounter for removal of sutures: Secondary | ICD-10-CM | POA: Diagnosis not present

## 2019-05-14 ENCOUNTER — Other Ambulatory Visit: Payer: Self-pay

## 2019-05-14 DIAGNOSIS — F325 Major depressive disorder, single episode, in full remission: Secondary | ICD-10-CM

## 2019-05-14 DIAGNOSIS — J42 Unspecified chronic bronchitis: Secondary | ICD-10-CM

## 2019-05-14 MED ORDER — AMPHETAMINE-DEXTROAMPHETAMINE 20 MG PO TABS: ORAL_TABLET | ORAL | 0 refills | Status: DC

## 2019-05-25 NOTE — Progress Notes (Signed)
FOLLOW UP  Assessment and Plan:   Cholesterol Not currently on medication; patient reports significant dietary changes -  Initiate medication if LDL persistently 130+ Continue low cholesterol diet and exercise.  Check lipid panel.   Major Depression currently in remission/anxiety Continue medications - discussed risks of daily benzo use, recommended regular medication vacations and minimal necessary use.  Lifestyle discussed: diet/exerise, sleep hygiene, stress management, hydration  GERD/Barrett's esophagus Well managed on current medications - dexilant 60 mg daily - Followed by Dr. Ferdinand Lango  AVOID GOODY POWDERS, NSAIDS Discussed diet, avoiding triggers and other lifestyle changes  Vitamin D Def/ osteoporosis prevention Continue supplementation Check Vit D level  Cervical degenerative disc disease Dry needling helped associated trapezius tension/spasms - muscle relaxers were not helpful Follow up with Dr. Rip Harbour -   R elbow pain ? Lateral epicondylytis, tendonitis though night time sx are atypical Try tennis elbow band during the day and night for 2 weeks with antiinflammatory; follow up ortho as planned if not improving   Continue diet and meds as discussed. Further disposition pending results of labs. Discussed med's effects and SE's.   Over 30 minutes of exam, counseling, chart review, and critical decision making was performed.   Future Appointments  Date Time Provider [place]  11/22/2019  9:00 AM [name], NP [facility] None    ----------------------------------------------------------------------------------------------------------------------  HPI 59 y.o. female  presents for 6 month follow up and medication management for severe GERD with Barrett's esphagitis, hyperlipidemia with goal LDL <100, major depression in remission, anxiety, vitamin D deficiency.   Daughter is getting married in May 2021.   She has headaches associated with stress;  recently improved, come in in spurts, admits to using goody powders when severe but trying to cut down.  She has neck pain, arthritis, with intermittent R radicular symptoms, following with neurology and getting shots. Too soon for surgery.   She also has intermittent R elbow pain, tender over lateral epicondyle, worse at night and wakes her up. Denies numbness/tingling/radiating symptoms. Hasn't tried band for support. Plans to follow up with ortho.   Has hx of GERD with Barrett's esophagus, followed by Dr. Collier Salina, reports did have ulcers recently. She is on dexilant 60 mg daily.   Has xanax but rarely uses during the day, typically takes to sleep at night if has had trouble sleeping for several days. Has tried gabapentin before and had too much fogginess.   She takes 1/2 tab adderall once daily, rarely second tab in hour if running late, takes Mon-Fri without SE.   BMI is Body mass index is 23.28 kg/m., she has been working on diet and exercise, walking 2-4 miles daily. Trying to cut back on portions, was drinking 3-4 glasses of wine, has cut out wine and doing occasional vodka cranberry.  Wt Readings from Last 3 Encounters:  05/26/19 135 lb 9.6 oz (61.5 kg)  11/19/18 131 lb (59.4 kg)  05/03/18 125 lb (56.7 kg)   Today their BP is BP: 140/82    She is not on cholesterol medication and denies myalgias- she is following a nutritionist diet for cholesterol.  Her cholesterol is not at goal at the last visit.  The cholesterol last visit was:   Lab Results  Component Value Date   CHOL 235 (H) 11/19/2018   HDL 95 11/19/2018   LDLCALC 123 (H) 11/19/2018   TRIG 76 11/19/2018   CHOLHDL 2.5 11/19/2018    Last GFR:  Lab Results  Component Value Date   [facility]  98 11/19/2018   Patient is on Vitamin D supplement but not currently taking consistently:    Lab Results  Component Value Date   VD25OH 37 11/19/2018      Current Medications:  Current Outpatient Medications on File Prior to  Visit  Medication Sig  . acetaminophen (TYLENOL 8 HOUR) 650 MG CR tablet Take 1 tablet (650 mg total) by mouth every 8 (eight) hours as needed for pain or fever.  . Adapalene-Benzoyl Peroxide (EPIDUO) 0.1-2.5 % gel APPLY A SMALL AMOUNT AT BEDTIME AS NEEDED  . ALPRAZolam (XANAX) 0.5 MG tablet Take 1/2 to 1 tablet 2 to 3 x / day ONLY if needed for Anxiety Attack or Sleep  & please try to limit to 5 days / week to avoid addiction  . amphetamine-dextroamphetamine (ADDERALL) 20 MG tablet Take 1/2 tablet 2 x /day for Focus & Concentration  . dexlansoprazole (DEXILANT) 60 MG capsule Take 60 mg by mouth daily.  Marland Kitchen estradiol (ESTRACE) 2 MG tablet Take 1 tablet (2 mg total) by mouth daily.  Marland Kitchen ibuprofen (ADVIL,MOTRIN) 400 MG tablet Take 1 tablet (400 mg total) by mouth every 6 (six) hours as needed.  . tretinoin (RETIN-A) 0.025 % gel Apply topically at bedtime.  . butalbital-acetaminophen-caffeine (FIORICET, ESGIC) 50-325-40 MG tablet Take 1 tablet by mouth every 6 (six) hours as needed for headache. (Patient not taking: Reported on 11/19/2018)  . ondansetron (ZOFRAN ODT) 8 MG disintegrating tablet Take 1 tablet (8 mg total) by mouth every 8 (eight) hours as needed for nausea. (Patient not taking: Reported on 05/26/2019)  . topiramate (TOPAMAX) 25 MG tablet 1-2 at bedtime (Patient not taking: Reported on 05/26/2019)   No current facility-administered medications on file prior to visit.     Allergies:  Allergies  Allergen Reactions  . Brintellix [Vortioxetine] Nausea Only     Medical History:  Past Medical History:  Diagnosis Date  . Allergy   . Anxiety   . Barrett's esophagus   . Depression   . GERD (gastroesophageal reflux disease)   . History of bladder surgery   . Liver cyst 06/07/2015   Benign appearing liver cysts noted incidentally on multiple imaging scans. No further workup recommended.   . Miscarriage    twice  . Peptic ulcer disease   . Renal disorder    kidney stones   Family  history- Reviewed and unchanged Social history- Reviewed and unchanged   Review of Systems:  Review of Systems  Constitutional: Negative for malaise/fatigue and weight loss.  HENT: Negative for hearing loss and tinnitus.   Eyes: Negative for blurred vision and double vision.  Respiratory: Negative for cough, shortness of breath and wheezing.   Cardiovascular: Negative for chest pain, palpitations, orthopnea, claudication and leg swelling.  Gastrointestinal: Negative for abdominal pain, blood in stool, constipation, diarrhea, heartburn, melena, nausea and vomiting.  Genitourinary: Negative.   Musculoskeletal: Positive for neck pain. Negative for joint pain and myalgias.  Skin: Negative for rash.  Neurological: Positive for headaches (r/t neck pain). Negative for dizziness, tingling, sensory change and weakness.  Endo/Heme/Allergies: Negative for polydipsia.  Psychiatric/Behavioral: Negative for depression. The patient has insomnia (Related to neck pain). The patient is not nervous/anxious (Currently high stress related to work and neck pain).   All other systems reviewed and are negative.    Physical Exam: BP 140/82   Pulse 70   Temp (!) 96.8 F (36 C)   Wt 135 lb 9.6 oz (61.5 kg)   SpO2 98%   BMI  23.28 kg/m  Wt Readings from Last 3 Encounters:  05/26/19 135 lb 9.6 oz (61.5 kg)  11/19/18 131 lb (59.4 kg)  05/03/18 125 lb (56.7 kg)   General Appearance: Well nourished, in no apparent distress. Eyes: PERRLA, EOMs, conjunctiva no swelling or erythema Sinuses: No Frontal/maxillary tenderness ENT/Mouth: Ext aud canals clear, TMs without erythema, bulging. No erythema, swelling, or exudate on post pharynx.  Tonsils not swollen or erythematous. Hearing normal.  Neck: Supple, thyroid normal.  Respiratory: Respiratory effort normal, BS equal bilaterally without rales, rhonchi, wheezing or stridor.  Cardio: RRR with no MRGs. Brisk peripheral pulses without edema.  Abdomen: Soft, + BS.   Non tender, no guarding, rebound, hernias, masses. Lymphatics: Non tender without lymphadenopathy.  Musculoskeletal: Full ROM, 5/5 strength, Normal gait. No cervical point/bony tenderness, some tension through bilateral traps. She has R lateral epicondyle/tendon tenderness without erythema, swelling.  Skin: Warm, dry without rashes, lesions, ecchymosis.  Neuro: Cranial nerves intact. No cerebellar symptoms.  Psych: Awake and oriented X 3, normal affect, Insight and Judgment appropriate.    [name], NP 4:53 PM [facility]

## 2019-05-26 ENCOUNTER — Ambulatory Visit: Payer: BC Managed Care – PPO | Admitting: Adult Health

## 2019-05-26 ENCOUNTER — Encounter: Payer: Self-pay | Admitting: Adult Health

## 2019-05-26 ENCOUNTER — Other Ambulatory Visit: Payer: Self-pay

## 2019-05-26 VITALS — BP 140/82 | HR 70 | Temp 96.8°F | Wt 135.6 lb

## 2019-05-26 DIAGNOSIS — F325 Major depressive disorder, single episode, in full remission: Secondary | ICD-10-CM | POA: Diagnosis not present

## 2019-05-26 DIAGNOSIS — K2100 Gastro-esophageal reflux disease with esophagitis, without bleeding: Secondary | ICD-10-CM | POA: Diagnosis not present

## 2019-05-26 DIAGNOSIS — F419 Anxiety disorder, unspecified: Secondary | ICD-10-CM

## 2019-05-26 DIAGNOSIS — K22719 Barrett's esophagus with dysplasia, unspecified: Secondary | ICD-10-CM

## 2019-05-26 DIAGNOSIS — Z79899 Other long term (current) drug therapy: Secondary | ICD-10-CM

## 2019-05-26 DIAGNOSIS — F4541 Pain disorder exclusively related to psychological factors: Secondary | ICD-10-CM | POA: Diagnosis not present

## 2019-05-26 DIAGNOSIS — E785 Hyperlipidemia, unspecified: Secondary | ICD-10-CM | POA: Diagnosis not present

## 2019-05-26 DIAGNOSIS — Z6822 Body mass index (BMI) 22.0-22.9, adult: Secondary | ICD-10-CM

## 2019-05-26 DIAGNOSIS — M25521 Pain in right elbow: Secondary | ICD-10-CM

## 2019-05-26 DIAGNOSIS — E559 Vitamin D deficiency, unspecified: Secondary | ICD-10-CM

## 2019-05-26 DIAGNOSIS — G44209 Tension-type headache, unspecified, not intractable: Secondary | ICD-10-CM

## 2019-05-26 MED ORDER — MELOXICAM 15 MG PO TABS: ORAL_TABLET | ORAL | 1 refills | Status: DC

## 2019-05-26 MED ORDER — BUTALBITAL-APAP-CAFFEINE 50-325-40 MG PO TABS: 1.0000 | ORAL_TABLET | Freq: Four times a day (QID) | ORAL | 0 refills | Status: DC | PRN

## 2019-05-26 NOTE — Patient Instructions (Addendum)
Goals    . LDL CALC < 130      Try to plan on relatively health "splurges" that feel special for the weekend, including places that you can go out to with family or friends were you can make good choices that you enjoy    Drink 1/2 your body weight in fluid ounces of water daily; drink a tall glass of water 30 min before meals  Don't eat until you're stuffed- listen to your stomach and eat until you are 80% full   Try eating off of a salad plate; wait 10 min after finishing before going back for seconds  Start by eating the vegetables on your plate; aim for 50% of your meals to be fruits or vegetables  Then eat your protein - lean meats (grass fed if possible), fish, beans, nuts in moderation  Eat your carbs/starch last ONLY if you still are hungry. If you can, stop before finishing it all  Avoid sugar and flour - the closer it looks to it's original form in nature, typically the better it is for you  Splurge in moderation - "assign" days when you get to splurge and have the "bad stuff" - I like to follow a 80% - 20% plan- "good" choices 80 % of the time, "bad" choices in moderation 20% of the time  Simple equation is: Calories out > calories in = weight loss - even if you eat the bad stuff, if you limit portions, you will still lose weight

## 2019-05-27 LAB — COMPLETE METABOLIC PANEL WITH GFR
AG Ratio: 1.7 (calc) (ref 1.0–2.5)
ALT: 11 U/L (ref 6–29)
AST: 17 U/L (ref 10–35)
Albumin: 4.5 g/dL (ref 3.6–5.1)
Alkaline phosphatase (APISO): 58 U/L (ref 37–153)
BUN: 15 mg/dL (ref 7–25)
CO2: 29 mmol/L (ref 20–32)
Calcium: 9.6 mg/dL (ref 8.6–10.4)
Chloride: 103 mmol/L (ref 98–110)
Creat: 0.69 mg/dL (ref 0.50–1.05)
GFR, Est African American: 113 mL/min/{1.73_m2} (ref >=60)
GFR, Est Non African American: 97 mL/min/{1.73_m2} (ref >=60)
Globulin: 2.6 g/dL (calc) (ref 1.9–3.7)
Glucose, Bld: 99 mg/dL (ref 65–99)
Potassium: 4.4 mmol/L (ref 3.5–5.3)
Sodium: 139 mmol/L (ref 135–146)
Total Bilirubin: 0.7 mg/dL (ref 0.2–1.2)
Total Protein: 7.1 g/dL (ref 6.1–8.1)

## 2019-05-27 LAB — CBC WITH DIFFERENTIAL/PLATELET
Absolute Monocytes: 653 cells/uL (ref 200–950)
Basophils Absolute: 40 cells/uL (ref 0–200)
Basophils Relative: 0.6 %
Eosinophils Absolute: 112 cells/uL (ref 15–500)
Eosinophils Relative: 1.7 %
HCT: 36.6 % (ref 35.0–45.0)
Hemoglobin: 12.6 g/dL (ref 11.7–15.5)
Lymphs Abs: 1914 cells/uL (ref 850–3900)
MCH: 33.3 pg — ABNORMAL HIGH (ref 27.0–33.0)
MCHC: 34.4 g/dL (ref 32.0–36.0)
MCV: 96.8 fL (ref 80.0–100.0)
MPV: 9.4 fL (ref 7.5–12.5)
Monocytes Relative: 9.9 %
Neutro Abs: 3881 cells/uL (ref 1500–7800)
Neutrophils Relative %: 58.8 %
Platelets: 285 10*3/uL (ref 140–400)
RBC: 3.78 10*6/uL — ABNORMAL LOW (ref 3.80–5.10)
RDW: 12.3 % (ref 11.0–15.0)
Total Lymphocyte: 29 %
WBC: 6.6 10*3/uL (ref 3.8–10.8)

## 2019-05-27 LAB — LIPID PANEL
Cholesterol: 219 mg/dL — ABNORMAL HIGH (ref <200)
HDL: 83 mg/dL (ref >=50)
LDL Cholesterol (Calc): 119 mg/dL (calc) — ABNORMAL HIGH
Non-HDL Cholesterol (Calc): 136 mg/dL (calc) — ABNORMAL HIGH (ref <130)
Total CHOL/HDL Ratio: 2.6 (calc) (ref <5.0)
Triglycerides: 80 mg/dL (ref <150)

## 2019-05-27 LAB — TSH: TSH: 0.88 mIU/L (ref 0.40–4.50)

## 2019-05-27 LAB — MAGNESIUM: Magnesium: 2.1 mg/dL (ref 1.5–2.5)

## 2019-06-09 ENCOUNTER — Ambulatory Visit (INDEPENDENT_AMBULATORY_CARE_PROVIDER_SITE_OTHER): Payer: BC Managed Care – PPO

## 2019-06-09 ENCOUNTER — Ambulatory Visit
Admission: EM | Admit: 2019-06-09 | Discharge: 2019-06-09 | Disposition: A | Discharge status: Home / Self Care | Payer: BC Managed Care – PPO | Attending: Emergency Medicine | Admitting: Emergency Medicine

## 2019-06-09 ENCOUNTER — Other Ambulatory Visit: Payer: Self-pay

## 2019-06-09 DIAGNOSIS — R05 Cough: Secondary | ICD-10-CM

## 2019-06-09 DIAGNOSIS — R059 Cough, unspecified: Secondary | ICD-10-CM

## 2019-06-09 DIAGNOSIS — Z20822 Contact with and (suspected) exposure to covid-19: Secondary | ICD-10-CM | POA: Diagnosis not present

## 2019-06-09 MED ORDER — AEROCHAMBER PLUS FLO-VU MEDIUM MISC: 1.0000 | Freq: Once | 0 refills | Status: AC

## 2019-06-09 MED ORDER — ALBUTEROL SULFATE HFA 108 (90 BASE) MCG/ACT IN AERS: 2.0000 | INHALATION_SPRAY | RESPIRATORY_TRACT | 0 refills | Status: DC | PRN

## 2019-06-09 MED ORDER — BENZONATATE 100 MG PO CAPS: 100.0000 mg | ORAL_CAPSULE | Freq: Three times a day (TID) | ORAL | 0 refills | Status: DC

## 2019-06-09 NOTE — Discharge Instructions (Signed)
Your COVID test is pending - it is important to quarantine / isolate at home until your results are back. °If you test positive and would like further evaluation for persistent or worsening symptoms, you may schedule an E-visit or virtual (video) visit throughout the [facility] MyChart app or website. ° °PLEASE NOTE: If you develop severe chest pain or shortness of breath please go to the ER or call 9-1-1 for further evaluation --> DO NOT schedule electronic or virtual visits for this. °Please call our office for further guidance / recommendations as needed. ° °For information about the Covid vaccine, please visit [email or web address] °

## 2019-06-09 NOTE — ED Triage Notes (Signed)
Pt cough with a thick yellow sputum that she feels like she is choking on for 3days. Pt c/o chest congestion and tightness when coughing. Denies SOB. Pt states her husband had a  positive covid exposure Friday.

## 2019-06-09 NOTE — ED Provider Notes (Signed)
[name]    [name] Arrival date & time: 06/09/19  1144      History   Chief Complaint Chief Complaint  Patient presents with  . Cough    HPI Haley Sparks is a 59 y.o. female with history of GERD presenting for productive cough for the last 3 days.  Patient endorsing secondary on states he has been's coworker tested positive last week.  States her husband's Covid test is currently pending: He is symptomatic with cough.  Patient endorsing yellow sputum without blood.  Does endorse some chest tightness and dyspnea during coughing fits, though otherwise is without shortness of breath, chest pain.  Denying fever, myalgias, arthralgias, lower extremity edema, nausea, vomiting, abdominal pain.  Not currently take anything for symptoms.    Past Medical History:  Diagnosis Date  . Allergy   . Anxiety   . Barrett's esophagus   . Depression   . GERD (gastroesophageal reflux disease)   . History of bladder surgery   . Liver cyst 06/07/2015   Benign appearing liver cysts noted incidentally on multiple imaging scans. No further workup recommended.   . Miscarriage    twice  . Peptic ulcer disease   . Renal disorder    kidney stones    Patient Active Problem List   Diagnosis Date Noted  . Right elbow pain 05/26/2019  . DDD (degenerative disc disease), cervical 03/05/2017  . Barrett's esophagus 03/14/2015  . Hyperlipidemia LDL goal <100 09/12/2014  . History of dysplastic nevus 09/12/2014  . Vitamin D deficiency 09/12/2014  . Gastroesophageal reflux disease with esophagitis 09/12/2014  . Stress headaches 05/18/2014  . Environmental allergies   . Anxiety   . Depression, major, in remission [facility])     Past Surgical History:  Procedure Laterality Date  . ABDOMINAL HYSTERECTOMY  2004   has 1 ovary left, Dr. Corinna Capra  . BLADDER SUSPENSION    . CESAREAN SECTION    . TONSILLECTOMY      OB History   No obstetric history on file.      Home  Medications    Prior to Admission medications   Medication Sig Start Date End Date Taking? Authorizing Provider  albuterol (VENTOLIN HFA) 108 (90 Base) MCG/ACT inhaler Inhale 2 puffs into the lungs every 4 (four) hours as needed for wheezing or shortness of breath. 06/09/19   Hall-Potvin, [place], PA-C  ALPRAZolam Duanne Moron) 0.5 MG tablet Take 1/2 to 1 tablet 2 to 3 x / day ONLY if needed for Anxiety Attack or Sleep  & please try to limit to 5 days / week to avoid addiction 01/27/19   [name], NP  amphetamine-dextroamphetamine (ADDERALL) 20 MG tablet Take 1/2 tablet 2 x /day for Focus & Concentration 05/14/19   Unk Pinto, MD  benzonatate (TESSALON) 100 MG capsule Take 1 capsule (100 mg total) by mouth every 8 (eight) hours. 06/09/19   Hall-Potvin, [place], PA-C  butalbital-acetaminophen-caffeine (FIORICET) 50-325-40 MG tablet Take 1 tablet by mouth every 6 (six) hours as needed for headache. 05/26/19   [name], NP  dexlansoprazole (DEXILANT) 60 MG capsule Take 60 mg by mouth daily.    [provider]  estradiol (ESTRACE) 2 MG tablet Take 1 tablet (2 mg total) by mouth daily. 01/27/19   [name], NP  ibuprofen (ADVIL,MOTRIN) 400 MG tablet Take 1 tablet (400 mg total) by mouth every 6 (six) hours as needed. 05/03/18   Varney Biles, MD  meloxicam (MOBIC) 15 MG tablet Take one daily  with food for 2 weeks, can take with tylenol, can not take with aleve, iburpofen, then as needed daily for pain 05/26/19   [name], NP  Spacer/Aero-Holding Chambers (AEROCHAMBER PLUS FLO-VU MEDIUM) MISC 1 each by Other route once for 1 dose. 06/09/19 06/09/19  Hall-Potvin, [place], PA-C  tretinoin (RETIN-A) 0.025 % gel Apply topically at bedtime. 09/12/15   Vicie Mutters, PA-C    Family History Family History  Problem Relation Age of Onset  . Hypertension Mother   . Hypertension Father   . Heart disease Father   . Hyperlipidemia Father   . COPD Maternal Grandmother   . Pulmonary  embolism Maternal Grandmother   . Asthma Paternal Grandmother   . Heart disease Paternal Grandfather     Social History Social History   Tobacco Use  . Smoking status: Passive Smoke Exposure - Never Smoker  . Smokeless tobacco: Never Used  . Tobacco comment: Smoke exposure as a child through parents.  Substance Use Topics  . Alcohol use: Yes    Alcohol/week: 8.0 standard drinks    Types: 8 Glasses of wine per week  . Drug use: No     Allergies   Brintellix [vortioxetine]   Review of Systems As per HPI   Physical Exam Triage Vital Signs ED Triage Vitals [06/09/19 1206]  Enc Vitals Group     BP 137/88     Pulse Rate 96     Resp 16     Temp 98.1 F (36.7 C)     Temp Source Oral     SpO2 98 %     Weight      Height      Head Circumference      Peak Flow      Pain Score 5     Pain Loc      Pain Edu?      Excl. in [place]?    No data found.  Updated Vital Signs BP 137/88 (BP Location: Left Arm)   Pulse 96   Temp 98.1 F (36.7 C) (Oral)   Resp 16   SpO2 98%   Visual Acuity Right Eye Distance:   Left Eye Distance:   Bilateral Distance:    Right Eye Near:   Left Eye Near:    Bilateral Near:     Physical Exam Constitutional:      General: She is not in acute distress.    Appearance: She is normal weight. She is not ill-appearing or diaphoretic.  HENT:     Head: Normocephalic and atraumatic.     Mouth/Throat:     Mouth: Mucous membranes are moist.     Pharynx: Oropharynx is clear. No oropharyngeal exudate or posterior oropharyngeal erythema.  Eyes:     General: No scleral icterus.    Conjunctiva/sclera: Conjunctivae normal.     Pupils: Pupils are equal, round, and reactive to light.  Neck:     Comments: Trachea midline, negative JVD Cardiovascular:     Rate and Rhythm: Normal rate and regular rhythm.     Heart sounds: No murmur. No gallop.   Pulmonary:     Effort: Pulmonary effort is normal. No respiratory distress.     Comments: Decreased breath  sounds in midlung, lower lung bilaterally Musculoskeletal:        General: Normal range of motion.     Cervical back: Neck supple. No tenderness.     Right lower leg: No edema.     Left lower leg: No edema.  Lymphadenopathy:  Cervical: No cervical adenopathy.  Skin:    Capillary Refill: Capillary refill takes 2 to 3 seconds.     Coloration: Skin is not jaundiced or pale.     Findings: No bruising or rash.  Neurological:     General: No focal deficit present.     Mental Status: She is alert and oriented to person, place, and time.      UC Treatments / Results  Labs (all labs ordered are listed, but only abnormal results are displayed) Labs Reviewed  NOVEL CORONAVIRUS, NAA    EKG   Radiology DG Chest 2 View  Result Date: 06/09/2019 CLINICAL DATA:  Productive cough.  Chest congestion. EXAM: CHEST - 2 VIEW COMPARISON:  08/29/2010 FINDINGS: The heart size and mediastinal contours are within normal limits. Both lungs are clear. The visualized skeletal structures are unremarkable. IMPRESSION: Normal exam. Electronically Signed   By: Lorriane Shire M.D.   On: 06/09/2019 12:54    Procedures Procedures (including critical care time)  Medications Ordered in UC Medications - No data to display  Initial Impression / Assessment and Plan / UC Course  I have reviewed the triage vital signs and the nursing notes.  Pertinent labs & imaging results that were available during my care of the patient were reviewed by me and considered in my medical decision making (see chart for details).     Patient afebrile, nontoxic, with SpO2 98%.  Chest x-ray done in office, reviewed by me and radiology and compared to previous from 08/29/2010: Heart size and mediastinal contours WNL.  No infiltrate, pleural effusion, pneumothorax.  Reviewed findings with patient verbalized understanding.  Covid PCR pending.  Patient to quarantine until results are back.  We will treat supportively as outlined below  and have patient follow-up with PCP for further evaluation/management if needed.  Return precautions discussed, patient verbalized understanding and is agreeable to plan. Final Clinical Impressions(s) / UC Diagnoses   Final diagnoses:  Exposure to COVID-19 virus  Cough     Discharge Instructions     Your COVID test is pending - it is important to quarantine / isolate at home until your results are back. If you test positive and would like further evaluation for persistent or worsening symptoms, you may schedule an E-visit or virtual (video) visit throughout the [facility] app or website.  PLEASE NOTE: If you develop severe chest pain or shortness of breath please go to the ER or call 9-1-1 for further evaluation --> DO NOT schedule electronic or virtual visits for this. Please call our office for further guidance / recommendations as needed.  For information about the Covid vaccine, please visit [email or web address]    ED Prescriptions    Medication Sig Dispense Auth. Provider   benzonatate (TESSALON) 100 MG capsule Take 1 capsule (100 mg total) by mouth every 8 (eight) hours. 21 capsule Hall-Potvin, [place], PA-C   albuterol (VENTOLIN HFA) 108 (90 Base) MCG/ACT inhaler Inhale 2 puffs into the lungs every 4 (four) hours as needed for wheezing or shortness of breath. 18 g Hall-Potvin, [place], PA-C   Spacer/Aero-Holding Chambers (AEROCHAMBER PLUS FLO-VU MEDIUM) MISC 1 each by Other route once for 1 dose. 1 each Hall-Potvin, [place], PA-C     PDMP not reviewed this encounter.   Hall-Potvin, [place], [place] 06/09/19 1316

## 2019-06-10 ENCOUNTER — Telehealth: Payer: Self-pay | Admitting: Emergency Medicine

## 2019-06-10 LAB — NOVEL CORONAVIRUS, NAA: SARS-CoV-2, NAA: DETECTED — AB

## 2019-06-10 MED ORDER — PREDNISONE 10 MG PO TABS: 20.0000 mg | ORAL_TABLET | Freq: Every day | ORAL | 0 refills | Status: AC

## 2019-06-10 NOTE — Telephone Encounter (Signed)
Pt called to confirm that her COVID results were positive.  Verified identity using two identifiers, and reviewed results with patient.  Discussed quarantine, and follow up procedure.  Encouraged patient to call with continued questions.

## 2019-06-10 NOTE — Telephone Encounter (Signed)
Pt called stating she was offered prednisone yesterday and at the time did not want it, but would like to see if she can get it now.  Spoke to APP who gave verbal order for prednisone 20mg  x 7 days.  Patient made aware and verbalized understanding.

## 2019-06-11 ENCOUNTER — Telehealth (HOSPITAL_COMMUNITY): Payer: Self-pay | Admitting: Nurse Practitioner

## 2019-06-11 NOTE — Telephone Encounter (Signed)
Called to discuss with Haley Sparks about Covid symptoms and the use of bamlanivimab, a monoclonal antibody infusion for those with mild to moderate Covid symptoms and at a high risk of hospitalization.     Pt is not qualified for this infusion due to lack of identified risk factors and co-morbid conditions.  Symptoms reviewed as well as criteria for ending isolation.  Symptoms reviewed that would warrant ED/Hospital evaluation as well should her condition worsen. Preventative practices reviewed. Patient verbalized understanding.  Patient Active Problem List   Diagnosis Date Noted  . Right elbow pain 05/26/2019  . DDD (degenerative disc disease), cervical 03/05/2017  . Barrett's esophagus 03/14/2015  . Hyperlipidemia LDL goal <100 09/12/2014  . History of dysplastic nevus 09/12/2014  . Vitamin D deficiency 09/12/2014  . Gastroesophageal reflux disease with esophagitis 09/12/2014  . Stress headaches 05/18/2014  . Environmental allergies   . Anxiety   . Depression, major, in remission ([place])     Beckey Rutter, DNP, AGNP-C [facility] [email or web address] Main Line: [id number] ([place])

## 2019-06-23 ENCOUNTER — Encounter: Payer: Self-pay | Admitting: Adult Health

## 2019-06-23 ENCOUNTER — Other Ambulatory Visit: Payer: Self-pay | Admitting: Adult Health

## 2019-06-23 MED ORDER — MECLIZINE HCL 25 MG PO TABS: ORAL_TABLET | ORAL | 1 refills | Status: DC

## 2019-08-11 ENCOUNTER — Other Ambulatory Visit: Payer: Self-pay

## 2019-08-11 DIAGNOSIS — J42 Unspecified chronic bronchitis: Secondary | ICD-10-CM

## 2019-08-11 DIAGNOSIS — F325 Major depressive disorder, single episode, in full remission: Secondary | ICD-10-CM

## 2019-08-11 MED ORDER — ALPRAZOLAM 0.5 MG PO TABS: ORAL_TABLET | ORAL | 0 refills | Status: DC

## 2019-08-11 MED ORDER — AMPHETAMINE-DEXTROAMPHETAMINE 20 MG PO TABS: ORAL_TABLET | ORAL | 0 refills | Status: DC

## 2019-11-02 ENCOUNTER — Other Ambulatory Visit: Payer: Self-pay

## 2019-11-02 ENCOUNTER — Ambulatory Visit
Admission: EM | Admit: 2019-11-02 | Discharge: 2019-11-02 | Disposition: A | Discharge status: Home / Self Care | Payer: BC Managed Care – PPO | Attending: Emergency Medicine | Admitting: Emergency Medicine

## 2019-11-02 ENCOUNTER — Encounter: Payer: Self-pay | Admitting: Emergency Medicine

## 2019-11-02 DIAGNOSIS — R432 Parageusia: Secondary | ICD-10-CM | POA: Diagnosis not present

## 2019-11-02 DIAGNOSIS — Z1152 Encounter for screening for COVID-19: Secondary | ICD-10-CM | POA: Diagnosis not present

## 2019-11-02 NOTE — ED Triage Notes (Signed)
Pt here for covid test after having loss of taste x 2days

## 2019-11-02 NOTE — ED Provider Notes (Signed)
[name]    CSN: [id number] Arrival date & time: 11/02/19  1702      History   Chief Complaint Chief Complaint  Patient presents with  . loss of taste    HPI Haley Sparks is a 59 y.o. female with history as outlined below presenting for Covid testing.  States that she is requesting testing due to loss of taste for the last 2 days.  No change in smell, nasal congestion, fever, sore throat, cough, difficulty breathing.  Is s/p Covid vaccination: No known sick contacts.  Denies numbness, weakness of upper or lower extremities, severe abdominal pain, nausea, vomiting.   Past Medical History:  Diagnosis Date  . Allergy   . Anxiety   . Barrett's esophagus   . Depression   . GERD (gastroesophageal reflux disease)   . History of bladder surgery   . Liver cyst 06/07/2015   Benign appearing liver cysts noted incidentally on multiple imaging scans. No further workup recommended.   . Miscarriage    twice  . Peptic ulcer disease   . Renal disorder    kidney stones    Patient Active Problem List   Diagnosis Date Noted  . Right elbow pain 05/26/2019  . DDD (degenerative disc disease), cervical 03/05/2017  . Barrett's esophagus 03/14/2015  . Hyperlipidemia LDL goal <100 09/12/2014  . History of dysplastic nevus 09/12/2014  . Vitamin D deficiency 09/12/2014  . Gastroesophageal reflux disease with esophagitis 09/12/2014  . Stress headaches 05/18/2014  . Environmental allergies   . Anxiety   . Depression, major, in remission [facility])     Past Surgical History:  Procedure Laterality Date  . ABDOMINAL HYSTERECTOMY  2004   has 1 ovary left, Dr. Corinna Capra  . BLADDER SUSPENSION    . CESAREAN SECTION    . TONSILLECTOMY      OB History   No obstetric history on file.      Home Medications    Prior to Admission medications   Medication Sig Start Date End Date Taking? Authorizing Provider  albuterol (VENTOLIN HFA) 108 (90 Base) MCG/ACT inhaler Inhale 2 puffs  into the lungs every 4 (four) hours as needed for wheezing or shortness of breath. 06/09/19   Hall-Potvin, [place], PA-C  ALPRAZolam Duanne Moron) 0.5 MG tablet Take 1/2 to 1 tablet 2 to 3 x / day ONLY if needed for Anxiety Attack or Sleep  & please try to limit to 5 days / week to avoid addiction 08/11/19   [name], NP  amphetamine-dextroamphetamine (ADDERALL) 20 MG tablet Take 1/2 tablet 2 x /day for Focus & Concentration 08/11/19   [name], NP  benzonatate (TESSALON) 100 MG capsule Take 1 capsule (100 mg total) by mouth every 8 (eight) hours. 06/09/19   Hall-Potvin, [place], PA-C  butalbital-acetaminophen-caffeine (FIORICET) 50-325-40 MG tablet Take 1 tablet by mouth every 6 (six) hours as needed for headache. 05/26/19   [name], NP  dexlansoprazole (DEXILANT) 60 MG capsule Take 60 mg by mouth daily.    [provider]  estradiol (ESTRACE) 2 MG tablet Take 1 tablet (2 mg total) by mouth daily. 01/27/19   [name], NP  ibuprofen (ADVIL,MOTRIN) 400 MG tablet Take 1 tablet (400 mg total) by mouth every 6 (six) hours as needed. 05/03/18   Varney Biles, MD  meclizine (ANTIVERT) 25 MG tablet 1/2-1 pill up to 3 times daily for motion sickness/dizziness 06/23/19   [name], NP  meloxicam (MOBIC) 15 MG tablet Take one daily with food  for 2 weeks, can take with tylenol, can not take with aleve, iburpofen, then as needed daily for pain 05/26/19   [name], NP  tretinoin (RETIN-A) 0.025 % gel Apply topically at bedtime. 09/12/15   Vicie Mutters, PA-C    Family History Family History  Problem Relation Age of Onset  . Hypertension Mother   . Hypertension Father   . Heart disease Father   . Hyperlipidemia Father   . COPD Maternal Grandmother   . Pulmonary embolism Maternal Grandmother   . Asthma Paternal Grandmother   . Heart disease Paternal Grandfather     Social History Social History   Tobacco Use  . Smoking status: Passive Smoke Exposure - Never Smoker   . Smokeless tobacco: Never Used  . Tobacco comment: Smoke exposure as a child through parents.  Vaping Use  . Vaping Use: Never used  Substance Use Topics  . Alcohol use: Yes    Alcohol/week: 8.0 standard drinks    Types: 8 Glasses of wine per week  . Drug use: No     Allergies   Brintellix [vortioxetine]   Review of Systems As per HPI   Physical Exam Triage Vital Signs ED Triage Vitals [11/02/19 1749]  Enc Vitals Group     BP (!) 146/88     Pulse Rate 84     Resp 18     Temp 98.5 F (36.9 C)     Temp Source Oral     SpO2 99 %     Weight      Height      Head Circumference      Peak Flow      Pain Score 0     Pain Loc      Pain Edu?      Excl. in [place]?    No data found.  Updated Vital Signs BP (!) 146/88 (BP Location: Left Arm)   Pulse 84   Temp 98.5 F (36.9 C) (Oral)   Resp 18   SpO2 99%   Visual Acuity Right Eye Distance:   Left Eye Distance:   Bilateral Distance:    Right Eye Near:   Left Eye Near:    Bilateral Near:     Physical Exam Constitutional:      General: She is not in acute distress. HENT:     Head: Normocephalic and atraumatic.     Mouth/Throat:     Mouth: Mucous membranes are moist.     Pharynx: Oropharynx is clear. No oropharyngeal exudate or posterior oropharyngeal erythema.  Eyes:     General: No scleral icterus.    Pupils: Pupils are equal, round, and reactive to light.  Cardiovascular:     Rate and Rhythm: Normal rate and regular rhythm.  Pulmonary:     Effort: Pulmonary effort is normal. No respiratory distress.     Breath sounds: No wheezing, rhonchi or rales.  Musculoskeletal:     Cervical back: No tenderness.  Lymphadenopathy:     Cervical: No cervical adenopathy.  Skin:    Capillary Refill: Capillary refill takes less than 2 seconds.     Coloration: Skin is not jaundiced or pale.  Neurological:     General: No focal deficit present.     Mental Status: She is alert and oriented to person, place, and time.       UC Treatments / Results  Labs (all labs ordered are listed, but only abnormal results are displayed) Labs Reviewed  NOVEL CORONAVIRUS, NAA  EKG   Radiology No results found.  Procedures Procedures (including critical care time)  Medications Ordered in UC Medications - No data to display  Initial Impression / Assessment and Plan / UC Course  I have reviewed the triage vital signs and the nursing notes.  Pertinent labs & imaging results that were available during my care of the patient were reviewed by me and considered in my medical decision making (see chart for details).     Patient afebrile, nontoxic, with SpO2 99%.  Exam unremarkable at this time.  Covid PCR pending.  Patient to quarantine until results are back.  We will treat supportively as outlined below.  Return precautions discussed, patient verbalized understanding and is agreeable to plan. Final Clinical Impressions(s) / UC Diagnoses   Final diagnoses:  Loss of taste     Discharge Instructions     Your COVID test is pending - it is important to quarantine / isolate at home until your results are back. If you test positive and would like further evaluation for persistent or worsening symptoms, you may schedule an E-visit or virtual (video) visit throughout the [facility] app or website.  PLEASE NOTE: If you develop severe chest pain or shortness of breath please go to the ER or call 9-1-1 for further evaluation --> DO NOT schedule electronic or virtual visits for this. Please call our office for further guidance / recommendations as needed.  For information about the Covid vaccine, please visit [email or web address]    ED Prescriptions    None     PDMP not reviewed this encounter.   Neldon Mc [place], [place] 11/02/19 1849

## 2019-11-02 NOTE — Discharge Instructions (Addendum)
Your COVID test is pending - it is important to quarantine / isolate at home until your results are back. °If you test positive and would like further evaluation for persistent or worsening symptoms, you may schedule an E-visit or virtual (video) visit throughout the [facility] MyChart app or website. ° °PLEASE NOTE: If you develop severe chest pain or shortness of breath please go to the ER or call 9-1-1 for further evaluation --> DO NOT schedule electronic or virtual visits for this. °Please call our office for further guidance / recommendations as needed. ° °For information about the Covid vaccine, please visit [email or web address] °

## 2019-11-04 LAB — NOVEL CORONAVIRUS, NAA: SARS-CoV-2, NAA: NOT DETECTED

## 2019-11-04 LAB — SARS-COV-2, NAA 2 DAY TAT

## 2019-11-18 NOTE — Progress Notes (Deleted)
Complete Physical  Assessment and Plan:   Routine general medical examination at a health care facility   Hyperlipidemia LDL goal <100 - check lipids, decrease fatty foods, increase activity, wants to avoid medications  - low risk, had normal vessels on CTA 2017 - CBC with Differential/Platelet - CMP/GFR - Lipid panel   Vitamin D deficiency - VITAMIN D 25 Hydroxy (Vit-D Deficiency, Fractures)  Medication management - Magnesium  Barrett's esophagus with dysplasia -controlled, continue the same medications, follow up Dr. Collier Sparks NEEDS TO STOP GOODY POWDERS ***  Gastroesophageal reflux disease with esophagitis Continue PPI/H2 blocker, diet discussed, follow up Dr. Collier Sparks   Dysplastic nevus Continue fu derm   Depression, major, in remission (Haley Sparks) - TSH - amphetamine-dextroamphetamine (ADDERALL) 20 MG tablet; Take 0.5 tablets (10 mg total) by mouth 2 (two) times daily with a meal.  Dispense: 60 tablet; Refill: 0  Stress headaches *** Having daily headaches; AM headaches Possible undiagnosed sleep apnea - declines sleep study  Will try topamax at night Alternately could be tension headache; discussed PT, massage, heat application  Insomnia Insomnia- good sleep hygiene discussed, increase day time activity, try melatonin or benadryl, continue xanax as uses occasional only   Allergy, subsequent encounter - Allegra OTC, increase H20, allergy hygiene explained.  Screening for blood or protein in urine - Urinalysis, Routine w reflex microscopic (not at [facility]) - Microalbumin / creatinine urine ratio  Anemia, unspecified type -     CBC -     B12  Snoring *** + AM headaches; advsied sleep study, she adamantly declines despite discussion of risks of untreated OSA; encouraged to discuss with dentist for possible oral appliance   Discussed med's effects and SE's. Screening labs and tests as requested with regular follow-up as recommended. Over 40 minutes of exam, counseling, chart  review, and complex, high level critical decision making was performed this visit.   Future Appointments  Date Time Provider [place]  11/22/2019  9:00 AM Haley Comber, NP [facility] None     HPI  59 y.o. female  presents for a complete physical. She has Environmental allergies; Anxiety; Depression, major, in remission (Haley Sparks); Stress headaches; Hyperlipidemia LDL goal <100; History of dysplastic nevus; Vitamin D deficiency; Gastroesophageal reflux disease with esophagitis; Barrett's esophagus; DDD (degenerative disc disease), cervical; and Right elbow pain on their problem list.   She is married, 2 daughters, no grandkids yet. She works as a Psychologist, counselling. Youngest daughter will be getting married soon.   She is followed by GYN Dr. Corinna Sparks; She has had TAH and has osteopenia, on estrogen for osteopenia as well as hot flashes, on bASA.   She had some pseudodementia with normal testing after her grandmother passed, no issues since.   She has headaches associated with stress; having nearly daily and admits to using goody powders.  She does report she snores at night, wakes up with headaches frequently. She adamantly declines sleep study today.   Has hx of GERD with Barrett's esophagus, hx of ulcers, followed by Dr. Collier Sparks. She is on dexilant 60 mg daily. Has admitted to goody powders ***  Has xanax but rarely uses during the day, typically takes to sleep at night. Hasn't needed in a a while. Has tried gabapentin before and had too much fogginess.   She takes 1/2 tab adderall once daily, rarely second tab in hour if running late, takes Mon-Fri without SE.   She has history of dysplastic nevus, and follows with Dr. Ubaldo Sparks.   She has neck  pain, arthritis, with intermittent R radicular symptoms, following with neurology and getting shots. Too soon for surgery.   BMI is There is no height or weight on file to calculate BMI., she has been working on diet and exercise, though not as much  since working from home, [place] last month feeling much better mentally doing this, 1-2 miles daily.  Wt Readings from Last 3 Encounters:  05/26/19 135 lb 9.6 oz (61.5 kg)  11/19/18 131 lb (59.4 kg)  05/03/18 125 lb (56.7 kg)   *** need to add htn dx? ***  Her blood pressure has been controlled at home, today their BP is   She does workout, walks daily. She denies chest pain, shortness of breath, dizziness.  She had CTA in 2017 which showed normal caliber arteries and no atherosclerosis  She is not on cholesterol medication and denies myalgias. Her cholesterol is not at goal. The cholesterol last visit was:   Lab Results  Component Value Date   CHOL 219 (H) 05/26/2019   HDL 83 05/26/2019   LDLCALC 119 (H) 05/26/2019   TRIG 80 05/26/2019   CHOLHDL 2.6 05/26/2019   Last A1C in the office was:  Lab Results  Component Value Date   HGBA1C 4.9 11/18/2017   Lab Results  Component Value Date   GFRNONAA 97 05/26/2019   Patient is not on Vitamin D supplement.   Lab Results  Component Value Date   VD25OH 37 11/19/2018      Lab Results  Component Value Date   [id number] 456 11/19/2018     Current Medications:  Current Outpatient Medications on File Prior to Visit  Medication Sig Dispense Refill  . albuterol (VENTOLIN HFA) 108 (90 Base) MCG/ACT inhaler Inhale 2 puffs into the lungs every 4 (four) hours as needed for wheezing or shortness of breath. 18 g 0  . ALPRAZolam (XANAX) 0.5 MG tablet Take 1/2 to 1 tablet 2 to 3 x / day ONLY if needed for Anxiety Attack or Sleep  & please try to limit to 5 days / week to avoid addiction 60 tablet 0  . amphetamine-dextroamphetamine (ADDERALL) 20 MG tablet Take 1/2 tablet 2 x /day for Focus & Concentration 60 tablet 0  . benzonatate (TESSALON) 100 MG capsule Take 1 capsule (100 mg total) by mouth every 8 (eight) hours. 21 capsule 0  . butalbital-acetaminophen-caffeine (FIORICET) 50-325-40 MG tablet Take 1  tablet by mouth every 6 (six) hours as needed for headache. 30 tablet 0  . dexlansoprazole (DEXILANT) 60 MG capsule Take 60 mg by mouth daily.    Marland Kitchen estradiol (ESTRACE) 2 MG tablet Take 1 tablet (2 mg total) by mouth daily. 90 tablet 3  . ibuprofen (ADVIL,MOTRIN) 400 MG tablet Take 1 tablet (400 mg total) by mouth every 6 (six) hours as needed. 30 tablet 0  . meclizine (ANTIVERT) 25 MG tablet 1/2-1 pill up to 3 times daily for motion sickness/dizziness 30 tablet 1  . meloxicam (MOBIC) 15 MG tablet Take one daily with food for 2 weeks, can take with tylenol, can not take with aleve, iburpofen, then as needed daily for pain 30 tablet 1  . tretinoin (RETIN-A) 0.025 % gel Apply topically at bedtime. 45 g 1   No current facility-administered medications on file prior to visit.   Allergies:  Allergies  Allergen Reactions  . Brintellix [Vortioxetine] Nausea Only   Medical History:  Shehas Environmental allergies; Anxiety; Depression, major, in remission ([place]); Stress headaches;  Hyperlipidemia LDL goal <100; History of dysplastic nevus; Vitamin D deficiency; Gastroesophageal reflux disease with esophagitis; Barrett's esophagus; DDD (degenerative disc disease), cervical; and Right elbow pain on their problem list.   Health Maintenance:   Immunization History  Administered Date(s) Administered  . Influenza Split 12/25/2012, 12/20/2014  . Tdap 09/04/2012    Tetanus: 2014 Pneumovax: N/A Prevnar 13:  N/A Flu vaccine: 2016 Zostavax: N/A  LMP TAH Pap: 2018 Dr. Corinna Sparks MGM: 04/2016 gets with Dr. Corinna Sparks, wants to schedule with breast center - ordered DEXA: 2018 osteopenia Dr. Corinna Sparks Colonoscopy: Aug 2016 with Dr. Claretta [facility] [place] EGD: 03/2018, + gastritis/ulcer still, - on dexilant CXR 2012 CT AB 2001 CT renal 04/2018 CTA chest negative 2017 PFTs 2017 normal, Dr. Ashok Cordia  Vision: Dr. Jerline Pain, [place], 12/2017 Dental: Dr. Randol Kern, last 2019, goes q67m, has upcoming   Patient Care  Team: Unk Pinto, MD as PCP - General (Internal Medicine) Donell Beers, MD as Referring Physician (Orthopedic Surgery) Melrose Nakayama, MD as Consulting Physician (Orthopedic Surgery) Coral Spikes, MD (Unknown Physician Specialty)  Surgical History:  She  has a past surgical history that includes Abdominal hysterectomy (2004); Cesarean section; Bladder suspension; and Tonsillectomy. Family History:  She had a family history includes Asthma in her paternal grandmother; COPD in her maternal grandmother; Heart disease in her father and paternal grandfather; Hyperlipidemia in her father; Hypertension in her father and mother; Pulmonary embolism in her maternal grandmother. Social History:  She  reports that she is a non-smoker but has been exposed to tobacco smoke. She has never used smokeless tobacco. She reports current alcohol use of about 8.0 standard drinks of alcohol per week. She reports that she does not use drugs.  Review of Systems: *** Review of Systems  Constitutional: Negative.  Negative for malaise/fatigue and weight loss.  HENT: Negative.  Negative for hearing loss and tinnitus.   Eyes: Negative.  Negative for blurred vision and double vision.  Respiratory: Negative.  Negative for cough, shortness of breath and wheezing.   Cardiovascular: Negative.  Negative for chest pain, palpitations, orthopnea, claudication and leg swelling.  Gastrointestinal: Positive for heartburn (intermittent, after trigger foods). Negative for abdominal pain, blood in stool, constipation, diarrhea, melena, nausea and vomiting.  Genitourinary: Negative.  Negative for dysuria, flank pain, frequency, hematuria and urgency.  Musculoskeletal: Positive for neck pain (at night, some intermittent right radicular). Negative for joint pain and myalgias.  Skin: Negative.  Negative for rash.  Neurological: Positive for headaches (nearly daily; frequently AM). Negative for dizziness, tingling, sensory change and  weakness.  Endo/Heme/Allergies: Negative.  Negative for polydipsia.  Psychiatric/Behavioral: Negative.  Negative for depression, hallucinations, memory loss, substance abuse and suicidal ideas. The patient is not nervous/anxious and does not have insomnia.   All other systems reviewed and are negative.   Physical Exam: Estimated body mass index is 23.28 kg/m as calculated from the following:   Height as of 11/19/18: 5\' 4"  (1.626 m).   Weight as of 05/26/19: 135 lb 9.6 oz (61.5 kg). There were no vitals taken for this visit. General Appearance: Well nourished, in no apparent distress.  Eyes: PERRLA, EOMs, conjunctiva no swelling or erythema, normal fundi and vessels.  Sinuses: No Frontal/maxillary tenderness  ENT/Mouth: Ext aud canals clear, normal light reflex with TMs without erythema, bulging. Good dentition. No erythema, swelling, or exudate on post pharynx. Tonsils not swollen or erythematous. Hearing normal. + TMJ Neck: Supple, thyroid normal. No bruits  Respiratory: Respiratory effort normal, BS equal bilaterally  without rales, rhonchi, wheezing or stridor.  Cardio: RRR without murmurs, rubs or gallops. Brisk peripheral pulses without edema.  Chest: symmetric, with normal excursions and percussion.  Breasts: defer to GYN Abdomen: Soft, nontender, no guarding, rebound, hernias, masses, or organomegaly.  Lymphatics: Non tender without lymphadenopathy.  Genitourinary: defer to GYN Musculoskeletal: Full ROM all peripheral extremities,5/5 strength, and normal gait., normal distal neuro Skin: Warm, dry without rashes, lesions, ecchymosis. Neuro: Cranial nerves intact, reflexes equal bilaterally. Normal muscle tone, no cerebellar symptoms. Sensation intact.  Psych: Awake and oriented X 3, normal affect, Insight and Judgment appropriate.   EKG: WNL, NSCPT ***  Gorden Harms Haley Sparks 2:17 PM [facility] Doctor [facility]

## 2019-11-22 ENCOUNTER — Encounter: Payer: BLUE CROSS/BLUE SHIELD | Admitting: Adult Health

## 2019-11-24 ENCOUNTER — Other Ambulatory Visit: Payer: Self-pay

## 2019-11-24 DIAGNOSIS — J42 Unspecified chronic bronchitis: Secondary | ICD-10-CM

## 2019-11-24 MED ORDER — ALPRAZOLAM 0.5 MG PO TABS: ORAL_TABLET | ORAL | 0 refills | Status: DC

## 2019-11-25 NOTE — Progress Notes (Signed)
Complete Physical  Assessment and Plan:  Routine general medical examination at a health care facility  Elevated BP without dx of htn Intermittently elevated on review; well controlled today  Monitor blood pressure at home; call if consistently over 130/80 Continue DASH diet.   Reminder to go to the ER if any CP, SOB, nausea, dizziness, severe HA, changes vision/speech, left arm numbness and tingling and jaw pain.   Hyperlipidemia LDL goal <100 - check lipids, decrease fatty foods, increase activity, wants to avoid medications  - low risk, had normal vessels on CTA 2017 - CBC with Differential/Platelet - CMP/GFR - Lipid panel   Vitamin D deficiency - VITAMIN D 25 Hydroxy (Vit-D Deficiency, Fractures)  Medication management - Magnesium  Barrett's esophagus with dysplasia -controlled, continue the same medications, follow up Dr. Collier Salina NEEDS TO STOP GOODY POWDERS, NSAIDS  Gastroesophageal reflux disease with esophagitis Continue PPI/H2 blocker, diet discussed, follow up Dr. Collier Salina   Dysplastic nevus Continue fu derm   Depression, recurrent ([place]) Restart wellbutrin which she did well with previously; contact to increase to 300 mg if needed in 6-8 weeks Lifestyle discussed: diet/exerise, sleep hygiene, stress management, hydration - TSH - amphetamine-dextroamphetamine (ADDERALL) 20 MG tablet; Take 0.5 tablets (10 mg total) by mouth 2 (two) times daily with a meal.  Dispense: 60 tablet; Refill: 0 - Wellbutrin 150 mg ER once daily in AM  Stress headaches  Will treat mood, stress management discussed discussed PT, massage, heat application  Insomnia Insomnia- good sleep hygiene discussed, increase day time activity, try melatonin or benadryl, continue xanax as uses occasional only   Allergy, subsequent encounter - Allegra OTC, increase H20, allergy hygiene explained.  Screening for blood or protein in urine - Urinalysis, Routine w reflex microscopic (not at [facility]) -  Microalbumin / creatinine urine ratio  Anemia, unspecified type -     CBC -     B12   Discussed med's effects and SE's. Screening labs and tests as requested with regular follow-up as recommended. Over 40 minutes of exam, counseling, chart review, and complex, high level critical decision making was performed this visit.   Future Appointments  Date Time Provider [place]  11/28/2020 10:00 AM [name], NP [facility] None     HPI  59 y.o. female  presents for a complete physical. She has Environmental allergies; Anxiety; Depression, recurrent ([place]); Stress headaches; Hyperlipidemia LDL goal <100; History of dysplastic nevus; Vitamin D deficiency; Gastroesophageal reflux disease with esophagitis; Barrett's esophagus; DDD (degenerative disc disease), cervical; Right elbow pain; Elevated BP without diagnosis of hypertension; and Osteopenia on their problem list.   She is married, 2 daughters, eldest daughter just had first grandchild 2 weeks ago. 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 Very busy year, MIL has end stage CHF and not doing well, got new dog, youngest daughter got married in May  She is followed by GYN Dr. Corinna Capra; She has had TAH and has osteopenia, on estrogen for osteopenia as well as hot flashes, on bASA.    Hx of depression, very irritable and angry, was previously on Wellbutrin with good results and would like to restart.   She takes 1/2 tab adderall once daily, rarely second tab in hour if running late, takes Mon-Fri without SE.   Has xanax but rarely uses during the day, typically takes to sleep at night. Hasn't needed in a a while. Has tried gabapentin before and had too much fogginess.   She had some pseudodementia with normal testing  after her grandmother passed, no issues since.   She has headaches associated with stress; uses Excedrin migraine; admits occasional goody powder use. Has hx of GERD with Barrett's esophagus, hx of ulcers, followed by  Dr. Collier Salina. She is on dexilant 60 mg daily.   She has history of dysplastic nevus, and follows with Dr. Ubaldo Glassing.   She has neck pain, arthritis, with intermittent R radicular symptoms, following with neurology and getting shots, reports intermittent sx this year, manageable.  BMI is Body mass index is 22.14 kg/m., she has been working on diet and exercise, walking, helps with mood, 4-6 miles daily.   Has reduced alcohol intake from 8/week to 2-3 per week.  Wt Readings from Last 3 Encounters:  11/26/19 129 lb (58.5 kg)  05/26/19 135 lb 9.6 oz (61.5 kg)  11/19/18 131 lb (59.4 kg)   Her blood pressure has been controlled at home, today their BP is BP: 118/72 She does workout, walks daily. She denies chest pain, shortness of breath, dizziness.   She had CTA in 2017 which showed normal caliber arteries and no atherosclerosis  She is not on cholesterol medication and denies myalgias. Her cholesterol is not at goal. The cholesterol last visit was:   Lab Results  Component Value Date   CHOL 219 (H) 05/26/2019   HDL 83 05/26/2019   LDLCALC 119 (H) 05/26/2019   TRIG 80 05/26/2019   CHOLHDL 2.6 05/26/2019   Last A1C in the office was:  Lab Results  Component Value Date   HGBA1C 4.9 11/18/2017   Lab Results  Component Value Date   GFRNONAA 97 05/26/2019   Patient is not on Vitamin D supplement.  Lab Results  Component Value Date   VD25OH 37 11/19/2018      Lab Results  Component Value Date   [id number] 149 11/19/2018     Current Medications:  Current Outpatient Medications on File Prior to Visit  Medication Sig Dispense Refill  . albuterol (VENTOLIN HFA) 108 (90 Base) MCG/ACT inhaler Inhale 2 puffs into the lungs every 4 (four) hours as needed for wheezing or shortness of breath. 18 g 0  . butalbital-acetaminophen-caffeine (FIORICET) 50-325-40 MG tablet Take 1 tablet by mouth every 6 (six) hours as needed for headache. 30 tablet 0  . dexlansoprazole (DEXILANT) 60 MG capsule  Take 60 mg by mouth daily.    Marland Kitchen estradiol (ESTRACE) 2 MG tablet Take 1 tablet (2 mg total) by mouth daily. 90 tablet 3  . ibuprofen (ADVIL,MOTRIN) 400 MG tablet Take 1 tablet (400 mg total) by mouth every 6 (six) hours as needed. 30 tablet 0  . tretinoin (RETIN-A) 0.025 % gel Apply topically at bedtime. 45 g 1   No current facility-administered medications on file prior to visit.   Allergies:  Allergies  Allergen Reactions  . Brintellix [Vortioxetine] Nausea Only   Medical History:  Shehas Environmental allergies; Anxiety; Depression, recurrent ([place]); Stress headaches; Hyperlipidemia LDL goal <100; History of dysplastic nevus; Vitamin D deficiency; Gastroesophageal reflux disease with esophagitis; Barrett's esophagus; DDD (degenerative disc disease), cervical; Right elbow pain; Elevated BP without diagnosis of hypertension; and Osteopenia on their problem list.   Health Maintenance:   Immunization History  Administered Date(s) Administered  . Influenza Split 12/25/2012, 12/20/2014  . Tdap 09/04/2012    Tetanus: 2014 Pneumovax: N/A Prevnar 13:  N/A Flu vaccine: 2016  Zostavax: N/A Covid 19: 2/2, pfizer, will send card  LMP TAH Pap: 2018 Dr. Corinna Capra MGM: 12/2018, will call to schedule at breast  center DEXA: 2018 osteopenia Dr. Corinna Capra but wants to get next at breast center Colonoscopy: Aug 2016 normal with Dr. Ferdinand Lango, [place] EGD: 03/2018, + gastritis/ulcer still, - on dexilant, 2 year follow up CXR 2012 CT AB 2001 CT renal 04/2018 CTA chest negative 2017 PFTs 2017 normal, Dr. Ashok Cordia  Vision: Dr. Jerline Pain, [place], 12/2018, goes annually Dental: Dr. Randol Kern, last 2020, goes q19m but overdue, has scheduled in Oct 2021  Patient Care Team: Unk Pinto, MD as PCP - General (Internal Medicine) Donell Beers, MD as Referring Physician (Orthopedic Surgery) Melrose Nakayama, MD as Consulting Physician (Orthopedic Surgery) Coral Spikes, MD (Unknown Physician  Specialty)  Surgical History:  She  has a past surgical history that includes Abdominal hysterectomy (2004); Cesarean section; Bladder suspension; and Tonsillectomy. Family History:  She had a family history includes Asthma in her paternal grandmother; COPD in her maternal grandmother; Heart disease in her father and paternal grandfather; Hyperlipidemia in her father; Hypertension in her father and mother; Pulmonary embolism in her maternal grandmother. Social History:  She  reports that she is a non-smoker but has been exposed to tobacco smoke. She has never used smokeless tobacco. She reports current alcohol use of about 3.0 standard drinks of alcohol per week. She reports that she does not use drugs.  Review of Systems: Review of Systems  Constitutional: Negative.  Negative for malaise/fatigue and weight loss.  HENT: Negative.  Negative for hearing loss and tinnitus.   Eyes: Negative.  Negative for blurred vision and double vision.  Respiratory: Negative.  Negative for cough, shortness of breath and wheezing.   Cardiovascular: Negative.  Negative for chest pain, palpitations, orthopnea, claudication and leg swelling.  Gastrointestinal: Positive for heartburn (intermittent, after trigger foods, takes tums). Negative for abdominal pain, blood in stool, constipation, diarrhea, melena, nausea and vomiting.  Genitourinary: Negative.  Negative for dysuria, flank pain, frequency, hematuria and urgency.  Musculoskeletal: Positive for neck pain (at night, some intermittent right radicular). Negative for joint pain and myalgias.  Skin: Negative.  Negative for rash.  Neurological: Positive for headaches (nearly daily; frequently AM). Negative for dizziness, tingling, sensory change and weakness.  Endo/Heme/Allergies: Negative.  Negative for polydipsia.  Psychiatric/Behavioral: Positive for depression. Negative for hallucinations, memory loss, substance abuse and suicidal ideas. The patient is not  nervous/anxious and does not have insomnia.   All other systems reviewed and are negative.   Physical Exam: Estimated body mass index is 22.14 kg/m as calculated from the following:   Height as of this encounter: 5\' 4"  (1.626 m).   Weight as of this encounter: 129 lb (58.5 kg). BP 118/72   Pulse 83   Temp 97.7 F (36.5 C)   Ht 5\' 4"  (1.626 m)   Wt 129 lb (58.5 kg)   SpO2 97%   BMI 22.14 kg/m  General Appearance: Well nourished, in no apparent distress.  Eyes: PERRLA, EOMs, conjunctiva no swelling or erythema  Sinuses: No Frontal/maxillary tenderness  ENT/Mouth: Ext aud canals clear, normal light reflex with TMs without erythema, bulging. Good dentition. No erythema, swelling, or exudate on post pharynx. Tonsils not swollen or erythematous. Hearing normal. + TMJ Neck: Supple, thyroid normal. No bruits  Respiratory: Respiratory effort normal, BS equal bilaterally without rales, rhonchi, wheezing or stridor.  Cardio: RRR without murmurs, rubs or gallops. Brisk peripheral pulses without edema.  Chest: symmetric, with normal excursions and percussion.  Breasts: defer to GYN Abdomen: Soft, nontender, no guarding, rebound, hernias, masses, or organomegaly.  Lymphatics:  Non tender without lymphadenopathy.  Genitourinary: defer to GYN Musculoskeletal: Full ROM all peripheral extremities,5/5 strength, and normal gait., normal distal neuro Skin: Warm, dry without rashes, lesions, ecchymosis. Neuro: Cranial nerves intact, reflexes equal bilaterally. Normal muscle tone, no cerebellar symptoms. Sensation intact.  Psych: Awake and oriented X 3, normal affect, Insight and Judgment appropriate.   EKG: WNL, NSCPT   Gorden Harms Maximiano Lott 12:42 PM [facility]

## 2019-11-26 ENCOUNTER — Encounter: Payer: Self-pay | Admitting: Adult Health

## 2019-11-26 ENCOUNTER — Other Ambulatory Visit: Payer: Self-pay

## 2019-11-26 ENCOUNTER — Ambulatory Visit: Payer: BC Managed Care – PPO | Admitting: Adult Health

## 2019-11-26 VITALS — BP 118/72 | HR 83 | Temp 97.7°F | Ht 64.0 in | Wt 129.0 lb

## 2019-11-26 DIAGNOSIS — Z130 Encounter for screening for diseases of the blood and blood-forming organs and certain disorders involving the immune mechanism: Secondary | ICD-10-CM | POA: Diagnosis not present

## 2019-11-26 DIAGNOSIS — Z0000 Encounter for general adult medical examination without abnormal findings: Secondary | ICD-10-CM

## 2019-11-26 DIAGNOSIS — F4541 Pain disorder exclusively related to psychological factors: Secondary | ICD-10-CM

## 2019-11-26 DIAGNOSIS — E559 Vitamin D deficiency, unspecified: Secondary | ICD-10-CM

## 2019-11-26 DIAGNOSIS — K22719 Barrett's esophagus with dysplasia, unspecified: Secondary | ICD-10-CM

## 2019-11-26 DIAGNOSIS — F419 Anxiety disorder, unspecified: Secondary | ICD-10-CM

## 2019-11-26 DIAGNOSIS — Z1329 Encounter for screening for other suspected endocrine disorder: Secondary | ICD-10-CM | POA: Diagnosis not present

## 2019-11-26 DIAGNOSIS — E785 Hyperlipidemia, unspecified: Secondary | ICD-10-CM

## 2019-11-26 DIAGNOSIS — Z8249 Family history of ischemic heart disease and other diseases of the circulatory system: Secondary | ICD-10-CM | POA: Diagnosis not present

## 2019-11-26 DIAGNOSIS — Z131 Encounter for screening for diabetes mellitus: Secondary | ICD-10-CM | POA: Diagnosis not present

## 2019-11-26 DIAGNOSIS — R030 Elevated blood-pressure reading, without diagnosis of hypertension: Secondary | ICD-10-CM

## 2019-11-26 DIAGNOSIS — Z9109 Other allergy status, other than to drugs and biological substances: Secondary | ICD-10-CM

## 2019-11-26 DIAGNOSIS — Z13220 Encounter for screening for lipoid disorders: Secondary | ICD-10-CM | POA: Diagnosis not present

## 2019-11-26 DIAGNOSIS — K2100 Gastro-esophageal reflux disease with esophagitis, without bleeding: Secondary | ICD-10-CM

## 2019-11-26 DIAGNOSIS — Z79899 Other long term (current) drug therapy: Secondary | ICD-10-CM

## 2019-11-26 DIAGNOSIS — Z6822 Body mass index (BMI) 22.0-22.9, adult: Secondary | ICD-10-CM

## 2019-11-26 DIAGNOSIS — Z136 Encounter for screening for cardiovascular disorders: Secondary | ICD-10-CM

## 2019-11-26 DIAGNOSIS — Z1389 Encounter for screening for other disorder: Secondary | ICD-10-CM | POA: Diagnosis not present

## 2019-11-26 DIAGNOSIS — E538 Deficiency of other specified B group vitamins: Secondary | ICD-10-CM

## 2019-11-26 DIAGNOSIS — F339 Major depressive disorder, recurrent, unspecified: Secondary | ICD-10-CM

## 2019-11-26 DIAGNOSIS — Z86018 Personal history of other benign neoplasm: Secondary | ICD-10-CM

## 2019-11-26 DIAGNOSIS — M8580 Other specified disorders of bone density and structure, unspecified site: Secondary | ICD-10-CM

## 2019-11-26 DIAGNOSIS — M5030 Other cervical disc degeneration, unspecified cervical region: Secondary | ICD-10-CM

## 2019-11-26 MED ORDER — ALPRAZOLAM 0.5 MG PO TABS: ORAL_TABLET | ORAL | 0 refills | Status: DC

## 2019-11-26 MED ORDER — BUPROPION HCL ER (XL) 150 MG PO TB24: 150.0000 mg | ORAL_TABLET | ORAL | 0 refills | Status: DC

## 2019-11-26 MED ORDER — AMPHETAMINE-DEXTROAMPHETAMINE 20 MG PO TABS: ORAL_TABLET | ORAL | 0 refills | Status: DC

## 2019-11-26 NOTE — Patient Instructions (Signed)
Haley Sparks , Thank you for taking time to come for your Annual Wellness Visit. I appreciate your ongoing commitment to your health goals. Please review the following plan we discussed and let me know if I can assist you in the future.   These are the goals we discussed: Goals    . LDL CALC < 130       This is a list of the screening recommended for you and due dates:  Health Maintenance  Topic Date Due  .  Hepatitis C: One time screening is recommended by Center for Disease Control  (CDC) for  adults born from [birth year] through 1963.   Never done  . COVID-19 Vaccine (1) Never done  . HIV Screening  Never done  . Pap Smear  01/06/2017  . Flu Shot  10/24/2019  . Mammogram  01/06/2021  . Tetanus Vaccine  09/05/2022  . Colon Cancer Screening  01/02/2025   Mammogram due 01/07/2020 Can get bone density with this at breast centre  Check with insurance about shingrix vaccine - can get at [facility] or [facility] if covered  Start wellbutrin 150 mg daily in the morning - if needed in 6 weeks can increase to 2 tabs/day (let me know if you do this)     Know what a healthy weight is for you (roughly BMI <25) and aim to maintain this  Aim for 7+ servings of fruits and vegetables daily  65-80+ fluid ounces of water or unsweet tea for healthy kidneys  Limit to max 1 drink of alcohol per day; avoid smoking/tobacco  Limit animal fats in diet for cholesterol and heart health - choose grass fed whenever available  Avoid highly processed foods, and foods high in saturated/trans fats  Aim for low stress - take time to unwind and care for your mental health  Aim for 150 min of moderate intensity exercise weekly for heart health, and weights twice weekly for bone health  Aim for 7-9 hours of sleep daily    HYPERTENSION INFORMATION  Monitor your blood pressure at home, please keep a record and bring that in with you to your next office visit.   Go to the ER if any CP, SOB, nausea, dizziness, severe  HA, changes vision/speech  Testing/Procedures: HOW TO TAKE YOUR BLOOD PRESSURE:  Rest 5 minutes before taking your blood pressure.  Don't smoke or drink caffeinated beverages for at least 30 minutes before.  Take your blood pressure before (not after) you eat.  Sit comfortably with your back supported and both feet on the floor (don't cross your legs).  Elevate your arm to heart level on a table or a desk.  Use the proper sized cuff. It should fit smoothly and snugly around your bare upper arm. There should be enough room to slip a fingertip under the cuff. The bottom edge of the cuff should be 1 inch above the crease of the elbow.  Due to a recent study, SPRINT, we have changed our goal for the systolic or top blood pressure number. Ideally we want your top number at 120.  In the [facility] Trial, 5000 people were randomized to a goal BP of 120 and 5000 people were randomized to a goal BP of less than 140. The patients with the goal BP at 120 had LESS DEMENTIA, LESS HEART ATTACKS, AND LESS STROKES, AS WELL AS OVERALL DECREASED MORTALITY OR DEATH RATE.   There was another study that showed taking your blood pressure medications at night decrease cardiovascular  events.  However if you are on a fluid pill, please take this in the morning.   If you are willing, our goal BP is the top number of 120.   Your most recent BP: BP: 118/72   Take your medications faithfully as instructed. Maintain a healthy weight. Get at least 150 minutes of aerobic exercise per week. Minimize salt intake. Minimize alcohol intake  DASH Eating Plan DASH stands for "Dietary Approaches to Stop Hypertension." The DASH eating plan is a healthy eating plan that has been shown to reduce high blood pressure (hypertension). Additional health benefits may include reducing the risk of type 2 diabetes mellitus, heart disease, and stroke. The DASH eating plan may also help with weight loss. WHAT DO I NEED TO KNOW ABOUT THE  DASH EATING PLAN? For the DASH eating plan, you will follow these general guidelines:  Choose foods with a percent daily value for sodium of less than 5% (as listed on the food label).  Use salt-free seasonings or herbs instead of table salt or sea salt.  Check with your health care provider or pharmacist before using salt substitutes.  Eat lower-sodium products, often labeled as "lower sodium" or "no salt added."  Eat fresh foods.  Eat more vegetables, fruits, and low-fat dairy products.  Choose whole grains. Look for the word "whole" as the first word in the ingredient list.  Choose fish and skinless chicken or [place] more often than red meat. Limit fish, poultry, and meat to 6 oz (170 g) each day.  Limit sweets, desserts, sugars, and sugary drinks.  Choose heart-healthy fats.  Limit cheese to 1 oz (28 g) per day.  Eat more home-cooked food and less restaurant, buffet, and fast food.  Limit fried foods.  Cook foods using methods other than frying.  Limit canned vegetables. If you do use them, rinse them well to decrease the sodium.  When eating at a restaurant, ask that your food be prepared with less salt, or no salt if possible. WHAT FOODS CAN I EAT? Seek help from a dietitian for individual calorie needs. Grains Whole grain or whole wheat bread. Brown rice. Whole grain or whole wheat pasta. Quinoa, bulgur, and whole grain cereals. Low-sodium cereals. Corn or whole wheat flour tortillas. Whole grain cornbread. Whole grain crackers. Low-sodium crackers. Vegetables Fresh or frozen vegetables (raw, steamed, roasted, or grilled). Low-sodium or reduced-sodium tomato and vegetable juices. Low-sodium or reduced-sodium tomato sauce and paste. Low-sodium or reduced-sodium canned vegetables.  Fruits All fresh, canned (in natural juice), or frozen fruits. Meat and Other Protein Products Ground beef (85% or leaner), grass-fed beef, or beef trimmed of fat. Skinless chicken or  [place]. Ground chicken or [place]. Pork trimmed of fat. All fish and seafood. Eggs. Dried beans, peas, or lentils. Unsalted nuts and seeds. Unsalted canned beans. Dairy Low-fat dairy products, such as skim or 1% milk, 2% or reduced-fat cheeses, low-fat ricotta or cottage cheese, or plain low-fat yogurt. Low-sodium or reduced-sodium cheeses. Fats and Oils Tub margarines without trans fats. Light or reduced-fat mayonnaise and salad dressings (reduced sodium). Avocado. Safflower, olive, or canola oils. Natural peanut or almond butter. Other Unsalted popcorn and pretzels. The items listed above may not be a complete list of recommended foods or beverages. Contact your dietitian for more options. WHAT FOODS ARE NOT RECOMMENDED? Grains White bread. White pasta. White rice. Refined cornbread. Bagels and croissants. Crackers that contain trans fat. Vegetables Creamed or fried vegetables. Vegetables in a cheese sauce. Regular canned vegetables. Regular canned  tomato sauce and paste. Regular tomato and vegetable juices. Fruits Dried fruits. Canned fruit in light or heavy syrup. Fruit juice. Meat and Other Protein Products Fatty cuts of meat. Ribs, chicken wings, bacon, sausage, bologna, salami, chitterlings, fatback, hot dogs, bratwurst, and packaged luncheon meats. Salted nuts and seeds. Canned beans with salt. Dairy Whole or 2% milk, cream, half-and-half, and cream cheese. Whole-fat or sweetened yogurt. Full-fat cheeses or blue cheese. Nondairy creamers and whipped toppings. Processed cheese, cheese spreads, or cheese curds. Condiments Onion and garlic salt, seasoned salt, table salt, and sea salt. Canned and packaged gravies. Worcestershire sauce. Tartar sauce. Barbecue sauce. Teriyaki sauce. Soy sauce, including reduced sodium. Steak sauce. Fish sauce. Oyster sauce. Cocktail sauce. Horseradish. Ketchup and mustard. Meat flavorings and tenderizers. Bouillon cubes. Hot sauce. Tabasco sauce. Marinades.  Taco seasonings. Relishes. Fats and Oils Butter, stick margarine, lard, shortening, ghee, and bacon fat. Coconut, palm kernel, or palm oils. Regular salad dressings. Other Pickles and olives. Salted popcorn and pretzels. The items listed above may not be a complete list of foods and beverages to avoid. Contact your dietitian for more information. WHERE CAN I FIND MORE INFORMATION? National Heart, Lung, and Blood Institute: travelstabloid.com Document Released: 02/28/2011 Document Revised: 07/26/2013 Document Reviewed: 01/13/2013 [facility] Patient Information 2015 [place], [place]. This information is not intended to replace advice given to you by your health care provider. Make sure you discuss any questions you have with your health care provider.

## 2019-11-27 ENCOUNTER — Encounter: Payer: Self-pay | Admitting: Adult Health

## 2019-11-27 ENCOUNTER — Other Ambulatory Visit: Payer: Self-pay | Admitting: Adult Health

## 2019-11-27 DIAGNOSIS — E538 Deficiency of other specified B group vitamins: Secondary | ICD-10-CM | POA: Insufficient documentation

## 2019-11-27 LAB — CBC WITH DIFFERENTIAL/PLATELET
Absolute Monocytes: 614 cells/uL (ref 200–950)
Basophils Absolute: 59 cells/uL (ref 0–200)
Basophils Relative: 1 %
Eosinophils Absolute: 159 cells/uL (ref 15–500)
Eosinophils Relative: 2.7 %
HCT: 38.9 % (ref 35.0–45.0)
Hemoglobin: 13.3 g/dL (ref 11.7–15.5)
Lymphs Abs: 1794 cells/uL (ref 850–3900)
MCH: 33.3 pg — ABNORMAL HIGH (ref 27.0–33.0)
MCHC: 34.2 g/dL (ref 32.0–36.0)
MCV: 97.3 fL (ref 80.0–100.0)
MPV: 9.7 fL (ref 7.5–12.5)
Monocytes Relative: 10.4 %
Neutro Abs: 3275 cells/uL (ref 1500–7800)
Neutrophils Relative %: 55.5 %
Platelets: 291 10*3/uL (ref 140–400)
RBC: 4 10*6/uL (ref 3.80–5.10)
RDW: 11.8 % (ref 11.0–15.0)
Total Lymphocyte: 30.4 %
WBC: 5.9 10*3/uL (ref 3.8–10.8)

## 2019-11-27 LAB — COMPLETE METABOLIC PANEL WITH GFR
AG Ratio: 1.4 (calc) (ref 1.0–2.5)
ALT: 11 U/L (ref 6–29)
AST: 20 U/L (ref 10–35)
Albumin: 4.3 g/dL (ref 3.6–5.1)
Alkaline phosphatase (APISO): 63 U/L (ref 37–153)
BUN: 15 mg/dL (ref 7–25)
CO2: 26 mmol/L (ref 20–32)
Calcium: 10 mg/dL (ref 8.6–10.4)
Chloride: 103 mmol/L (ref 98–110)
Creat: 0.76 mg/dL (ref 0.50–1.05)
GFR, Est African American: 101 mL/min/{1.73_m2} (ref >=60)
GFR, Est Non African American: 87 mL/min/{1.73_m2} (ref >=60)
Globulin: 3 g/dL (calc) (ref 1.9–3.7)
Glucose, Bld: 86 mg/dL (ref 65–99)
Potassium: 4 mmol/L (ref 3.5–5.3)
Sodium: 140 mmol/L (ref 135–146)
Total Bilirubin: 0.7 mg/dL (ref 0.2–1.2)
Total Protein: 7.3 g/dL (ref 6.1–8.1)

## 2019-11-27 LAB — LIPID PANEL
Cholesterol: 232 mg/dL — ABNORMAL HIGH (ref <200)
HDL: 89 mg/dL (ref >=50)
LDL Cholesterol (Calc): 127 mg/dL (calc) — ABNORMAL HIGH
Non-HDL Cholesterol (Calc): 143 mg/dL (calc) — ABNORMAL HIGH (ref <130)
Total CHOL/HDL Ratio: 2.6 (calc) (ref <5.0)
Triglycerides: 69 mg/dL (ref <150)

## 2019-11-27 LAB — URINALYSIS, ROUTINE W REFLEX MICROSCOPIC
Bacteria, UA: NONE SEEN /HPF
Bilirubin Urine: NEGATIVE
Glucose, UA: NEGATIVE
Hgb urine dipstick: NEGATIVE
Leukocytes,Ua: NEGATIVE
Nitrite: NEGATIVE
RBC / HPF: NONE SEEN /HPF (ref 0–2)
Specific Gravity, Urine: 1.038 — ABNORMAL HIGH (ref 1.001–1.03)
WBC, UA: NONE SEEN /HPF (ref 0–5)
pH: 5.5 (ref 5.0–8.0)

## 2019-11-27 LAB — TSH: TSH: 0.75 mIU/L (ref 0.40–4.50)

## 2019-11-27 LAB — MAGNESIUM: Magnesium: 2 mg/dL (ref 1.5–2.5)

## 2019-11-27 LAB — HEMOGLOBIN A1C
Hgb A1c MFr Bld: 5 % of total Hgb (ref <5.7)
Mean Plasma Glucose: 97 (calc)
eAG (mmol/L): 5.4 (calc)

## 2019-11-27 LAB — VITAMIN B12: Vitamin B-12: 340 pg/mL (ref 200–1100)

## 2019-11-27 LAB — VITAMIN D 25 HYDROXY (VIT D DEFICIENCY, FRACTURES): Vit D, 25-Hydroxy: 24 ng/mL — ABNORMAL LOW (ref 30–100)

## 2019-11-27 MED ORDER — ASPIRIN EC 81 MG PO TBEC: 81.0000 mg | DELAYED_RELEASE_TABLET | Freq: Every day | ORAL | Status: AC

## 2019-12-13 ENCOUNTER — Other Ambulatory Visit: Payer: Self-pay | Admitting: Adult Health

## 2019-12-13 DIAGNOSIS — G44209 Tension-type headache, unspecified, not intractable: Secondary | ICD-10-CM

## 2019-12-13 MED ORDER — BUTALBITAL-APAP-CAFFEINE 50-325-40 MG PO TABS: 1.0000 | ORAL_TABLET | Freq: Four times a day (QID) | ORAL | 0 refills | Status: DC | PRN

## 2019-12-14 ENCOUNTER — Ambulatory Visit: Payer: BC Managed Care – PPO | Admitting: Adult Health Nurse Practitioner

## 2019-12-14 ENCOUNTER — Other Ambulatory Visit: Payer: Self-pay

## 2019-12-14 ENCOUNTER — Encounter: Payer: Self-pay | Admitting: Adult Health Nurse Practitioner

## 2019-12-14 VITALS — BP 128/72 | HR 74 | Temp 97.7°F | Ht 64.0 in | Wt 132.0 lb

## 2019-12-14 DIAGNOSIS — R030 Elevated blood-pressure reading, without diagnosis of hypertension: Secondary | ICD-10-CM | POA: Diagnosis not present

## 2019-12-14 DIAGNOSIS — Z7189 Other specified counseling: Secondary | ICD-10-CM

## 2019-12-14 DIAGNOSIS — R519 Headache, unspecified: Secondary | ICD-10-CM

## 2019-12-14 NOTE — Progress Notes (Signed)
Assessment and Plan:  Haley Sparks was seen today for headache, hypertension.  Diagnoses and all orders for this visit:  Persistent headaches No torodol in office today Discussed Ubrelvy sample  Has Fioricet PRN, rebound headaches? Discussed ibuprofen 800mg  every 8 hours, take with food Discussed multiple etiologies -     CBC with Differential/Platelet -     COMPLETE METABOLIC PANEL WITH GFR  Elevated blood pressure reading Continue to monitor blood pressure Monitor sodium intake Increase water intake -     TSH  Educated about COVID-19 virus infection Discussed testing for COVID19, pt declines this in office today   Contact office with any new or worsening symptoms Patient agrees with plan of care Discussed hospital precautions with patient  Further disposition pending results of labs. Discussed med's effects and SE's.   Over 30 minutes of face to face interview, exam, counseling, chart review, and critical decision making was performed.   Future Appointments  Date Time Provider [place]  05/31/2020  4:15 PM [name], NP [facility] None  11/28/2020 10:00 AM [name], NP [facility] None    ------------------------------------------------------------------------------------------------------------------   HPI 59 y.o.female presents for headache that has lasted over 7 days  She reports that it is constant and 8/10 did resolve to 5/10 for a short time.  Reports the pain is at the back of her neck and front of her head.  She has taken tylenol that was not helping.  She is taking prescribed fioricet that helped take the edge off but has not resolved.  She reports she has had headaches in the past resolve after one day and with Fioricet treatment. She denies any sinus symptoms, cough , sore throat, otalgia or vision changes.  Denies any chest pains or shortness of breath.   Reports her mother in-law passed away last week but reports no increase in stressors in  her life.  She has not been working related to this.  Reports that she has lots of family support.    Reports that she has a b/p at home have been elevated ranging from  120's-148 over 70's-96.  Denies any shortness of breath, she reports some heart palpitations but reports that related to anxiety.  She is taking bupropion and has alprazolam prn but uses rarely and mainly in evenings to help with sleep.  She reports she walks 62miles a day.  COVID positive 05/2019 without known residual side effects.    Past Medical History:  Diagnosis Date  . Allergy   . Anxiety   . Barrett's esophagus   . Depression   . GERD (gastroesophageal reflux disease)   . History of bladder surgery   . Liver cyst 06/07/2015   Benign appearing liver cysts noted incidentally on multiple imaging scans. No further workup recommended.   . Miscarriage    twice  . Peptic ulcer disease   . Renal disorder    kidney stones     Allergies  Allergen Reactions  . Brintellix [Vortioxetine] Nausea Only    Current Outpatient Medications on File Prior to Visit  Medication Sig  . albuterol (VENTOLIN HFA) 108 (90 Base) MCG/ACT inhaler Inhale 2 puffs into the lungs every 4 (four) hours as needed for wheezing or shortness of breath.  . ALPRAZolam (XANAX) 0.5 MG tablet Take 1/2 to 1 tablet 2 to 3 x / day ONLY if needed for Anxiety Attack or Sleep  & please try to limit to 5 days / week to avoid addiction  . amphetamine-dextroamphetamine (ADDERALL) 20 MG  tablet Take 1/2 tablet 2 x /day for Focus & Concentration  . aspirin EC 81 MG tablet Take 1 tablet (81 mg total) by mouth daily. Swallow whole.  Marland Kitchen buPROPion (WELLBUTRIN XL) 150 MG 24 hr tablet Take 1 tablet (150 mg total) by mouth every morning. For mood/depression.  . butalbital-acetaminophen-caffeine (FIORICET) 50-325-40 MG tablet Take 1 tablet by mouth every 6 (six) hours as needed for headache.  . dexlansoprazole (DEXILANT) 60 MG capsule Take 60 mg by mouth daily.  Marland Kitchen  estradiol (ESTRACE) 2 MG tablet Take 1 tablet (2 mg total) by mouth daily.  Marland Kitchen ibuprofen (ADVIL,MOTRIN) 400 MG tablet Take 1 tablet (400 mg total) by mouth every 6 (six) hours as needed.  . tretinoin (RETIN-A) 0.025 % gel Apply topically at bedtime.   No current facility-administered medications on file prior to visit.    ROS: all negative except above.   Physical Exam:  BP 128/72   Pulse 74   Temp 97.7 F (36.5 C)   Ht 5\' 4"  (1.626 m)   Wt 132 lb (59.9 kg)   SpO2 98%   BMI 22.66 kg/m   General Appearance: Well nourished, in no apparent distress. Eyes: PERRLA, EOMs, conjunctiva no swelling or erythema Sinuses: No Frontal/maxillary tenderness ENT/Mouth: Ext aud canals clear, TMs without erythema, bulging. No erythema, swelling, or exudate on post pharynx.  Tonsils not swollen or erythematous. Hearing normal.  Neck: Supple, thyroid normal.  Respiratory: Respiratory effort normal, BS equal bilaterally without rales, rhonchi, wheezing or stridor.  Cardio: RRR with no MRGs. Brisk peripheral pulses without edema.  Abdomen: Soft, + BS.  Non tender, no guarding, rebound, hernias, masses. Lymphatics: Non tender without lymphadenopathy.  Musculoskeletal: Full ROM, 5/5 strength, normal gait.  Skin: Warm, dry without rashes, lesions, ecchymosis.  Neuro: Cranial nerves intact. Normal muscle tone, no cerebellar symptoms. Sensation intact.  Psych: Awake and oriented X 3, normal affect, Insight and Judgment appropriate.     Garnet Sierras, NP 3:41 PM [facility]

## 2019-12-15 DIAGNOSIS — D649 Anemia, unspecified: Secondary | ICD-10-CM

## 2019-12-15 LAB — COMPLETE METABOLIC PANEL WITH GFR
AG Ratio: 1.6 (calc) (ref 1.0–2.5)
ALT: 10 U/L (ref 6–29)
AST: 17 U/L (ref 10–35)
Albumin: 4.1 g/dL (ref 3.6–5.1)
Alkaline phosphatase (APISO): 58 U/L (ref 37–153)
BUN: 10 mg/dL (ref 7–25)
CO2: 31 mmol/L (ref 20–32)
Calcium: 9.4 mg/dL (ref 8.6–10.4)
Chloride: 103 mmol/L (ref 98–110)
Creat: 0.64 mg/dL (ref 0.50–1.05)
GFR, Est African American: 115 mL/min/{1.73_m2} (ref >=60)
GFR, Est Non African American: 99 mL/min/{1.73_m2} (ref >=60)
Globulin: 2.6 g/dL (calc) (ref 1.9–3.7)
Glucose, Bld: 74 mg/dL (ref 65–99)
Potassium: 4.2 mmol/L (ref 3.5–5.3)
Sodium: 142 mmol/L (ref 135–146)
Total Bilirubin: 0.5 mg/dL (ref 0.2–1.2)
Total Protein: 6.7 g/dL (ref 6.1–8.1)

## 2019-12-15 LAB — CBC WITH DIFFERENTIAL/PLATELET
Absolute Monocytes: 615 cells/uL (ref 200–950)
Basophils Absolute: 48 cells/uL (ref 0–200)
Basophils Relative: 0.9 %
Eosinophils Absolute: 180 cells/uL (ref 15–500)
Eosinophils Relative: 3.4 %
HCT: 33.8 % — ABNORMAL LOW (ref 35.0–45.0)
Hemoglobin: 11.4 g/dL — ABNORMAL LOW (ref 11.7–15.5)
Lymphs Abs: 1818 cells/uL (ref 850–3900)
MCH: 33.1 pg — ABNORMAL HIGH (ref 27.0–33.0)
MCHC: 33.7 g/dL (ref 32.0–36.0)
MCV: 98.3 fL (ref 80.0–100.0)
MPV: 9.7 fL (ref 7.5–12.5)
Monocytes Relative: 11.6 %
Neutro Abs: 2639 cells/uL (ref 1500–7800)
Neutrophils Relative %: 49.8 %
Platelets: 245 10*3/uL (ref 140–400)
RBC: 3.44 10*6/uL — ABNORMAL LOW (ref 3.80–5.10)
RDW: 12.1 % (ref 11.0–15.0)
Total Lymphocyte: 34.3 %
WBC: 5.3 10*3/uL (ref 3.8–10.8)

## 2019-12-15 LAB — TSH: TSH: 1.06 mIU/L (ref 0.40–4.50)

## 2019-12-20 ENCOUNTER — Other Ambulatory Visit: Payer: Self-pay

## 2019-12-20 ENCOUNTER — Other Ambulatory Visit: Payer: BC Managed Care – PPO

## 2019-12-20 DIAGNOSIS — D649 Anemia, unspecified: Secondary | ICD-10-CM

## 2019-12-20 LAB — CBC WITH DIFFERENTIAL/PLATELET
Absolute Monocytes: 625 cells/uL (ref 200–950)
Basophils Absolute: 58 cells/uL (ref 0–200)
Basophils Relative: 1.1 %
Eosinophils Absolute: 207 cells/uL (ref 15–500)
Eosinophils Relative: 3.9 %
HCT: 36.4 % (ref 35.0–45.0)
Hemoglobin: 12.2 g/dL (ref 11.7–15.5)
Lymphs Abs: 1866 cells/uL (ref 850–3900)
MCH: 32.9 pg (ref 27.0–33.0)
MCHC: 33.5 g/dL (ref 32.0–36.0)
MCV: 98.1 fL (ref 80.0–100.0)
MPV: 9.5 fL (ref 7.5–12.5)
Monocytes Relative: 11.8 %
Neutro Abs: 2544 cells/uL (ref 1500–7800)
Neutrophils Relative %: 48 %
Platelets: 279 10*3/uL (ref 140–400)
RBC: 3.71 10*6/uL — ABNORMAL LOW (ref 3.80–5.10)
RDW: 12.2 % (ref 11.0–15.0)
Total Lymphocyte: 35.2 %
WBC: 5.3 10*3/uL (ref 3.8–10.8)

## 2020-02-03 ENCOUNTER — Other Ambulatory Visit: Payer: Self-pay | Admitting: Adult Health

## 2020-02-03 DIAGNOSIS — Z1231 Encounter for screening mammogram for malignant neoplasm of breast: Secondary | ICD-10-CM

## 2020-02-24 ENCOUNTER — Encounter: Payer: Self-pay | Admitting: Adult Health

## 2020-02-24 ENCOUNTER — Other Ambulatory Visit: Payer: Self-pay

## 2020-02-24 ENCOUNTER — Other Ambulatory Visit: Payer: Self-pay | Admitting: Adult Health

## 2020-02-24 ENCOUNTER — Ambulatory Visit: Payer: BC Managed Care – PPO | Admitting: Adult Health

## 2020-02-24 VITALS — BP 134/88 | HR 73 | Temp 97.5°F | Wt 128.0 lb

## 2020-02-24 DIAGNOSIS — L709 Acne, unspecified: Secondary | ICD-10-CM | POA: Diagnosis not present

## 2020-02-24 DIAGNOSIS — Z23 Encounter for immunization: Secondary | ICD-10-CM | POA: Diagnosis not present

## 2020-02-24 DIAGNOSIS — F419 Anxiety disorder, unspecified: Secondary | ICD-10-CM

## 2020-02-24 DIAGNOSIS — F5104 Psychophysiologic insomnia: Secondary | ICD-10-CM | POA: Diagnosis not present

## 2020-02-24 MED ORDER — TRAZODONE HCL 50 MG PO TABS: ORAL_TABLET | ORAL | 0 refills | Status: DC

## 2020-02-24 MED ORDER — TRETINOIN 0.05 % EX CREA: TOPICAL_CREAM | Freq: Every day | CUTANEOUS | 0 refills | Status: DC

## 2020-02-24 MED ORDER — ALPRAZOLAM 0.5 MG PO TABS: ORAL_TABLET | ORAL | 0 refills | Status: DC

## 2020-02-24 NOTE — Patient Instructions (Signed)
Trazodone tablets What is this medicine? TRAZODONE (TRAZ oh done) is used to treat depression. This medicine may be used for other purposes; ask your health care provider or pharmacist if you have questions. COMMON BRAND NAME(S): Desyrel What should I tell my health care provider before I take this medicine? They need to know if you have any of these conditions:  attempted suicide or thinking about it  bipolar disorder  bleeding problems  glaucoma  heart disease, or previous heart attack  irregular heart beat  kidney or liver disease  low levels of sodium in the blood  an unusual or allergic reaction to trazodone, other medicines, foods, dyes or preservatives  pregnant or trying to get pregnant  breast-feeding How should I use this medicine? Take this medicine by mouth with a glass of water. Follow the directions on the prescription label. Take this medicine shortly after a meal or a light snack. Take your medicine at regular intervals. Do not take your medicine more often than directed. Do not stop taking this medicine suddenly except upon the advice of your doctor. Stopping this medicine too quickly may cause serious side effects or your condition may worsen. A special MedGuide will be given to you by the pharmacist with each prescription and refill. Be sure to read this information carefully each time. Talk to your pediatrician regarding the use of this medicine in children. Special care may be needed. Overdosage: If you think you have taken too much of this medicine contact a poison control center or emergency room at once. NOTE: This medicine is only for you. Do not share this medicine with others. What if I miss a dose? If you miss a dose, take it as soon as you can. If it is almost time for your next dose, take only that dose. Do not take double or extra doses. What may interact with this medicine? Do not take this medicine with any of the following  medications:  certain medicines for fungal infections like fluconazole, itraconazole, ketoconazole, posaconazole, voriconazole  cisapride  dronedarone  linezolid  MAOIs like Carbex, Eldepryl, Marplan, Nardil, and Parnate  mesoridazine  methylene blue (injected into a vein)  pimozide  saquinavir  thioridazine This medicine may also interact with the following medications:  alcohol  antiviral medicines for HIV or AIDS  aspirin and aspirin-like medicines  barbiturates like phenobarbital  certain medicines for blood pressure, heart disease, irregular heart beat  certain medicines for depression, anxiety, or psychotic disturbances  certain medicines for migraine headache like almotriptan, eletriptan, frovatriptan, naratriptan, rizatriptan, sumatriptan, zolmitriptan  certain medicines for seizures like carbamazepine and phenytoin  certain medicines for sleep  certain medicines that treat or prevent blood clots like dalteparin, enoxaparin, warfarin  digoxin  fentanyl  lithium  NSAIDS, medicines for pain and inflammation, like ibuprofen or naproxen  other medicines that prolong the QT interval (cause an abnormal heart rhythm) like dofetilide  rasagiline  supplements like St. John's wort, kava kava, valerian  tramadol  tryptophan This list may not describe all possible interactions. Give your health care provider a list of all the medicines, herbs, non-prescription drugs, or dietary supplements you use. Also tell them if you smoke, drink alcohol, or use illegal drugs. Some items may interact with your medicine. What should I watch for while using this medicine? Tell your doctor if your symptoms do not get better or if they get worse. Visit your doctor or health care professional for regular checks on your progress. Because it may take  several weeks to see the full effects of this medicine, it is important to continue your treatment as prescribed by your  doctor. Patients and their families should watch out for new or worsening thoughts of suicide or depression. Also watch out for sudden changes in feelings such as feeling anxious, agitated, panicky, irritable, hostile, aggressive, impulsive, severely restless, overly excited and hyperactive, or not being able to sleep. If this happens, especially at the beginning of treatment or after a change in dose, call your health care professional. Dennis Bast may get drowsy or dizzy. Do not drive, use machinery, or do anything that needs mental alertness until you know how this medicine affects you. Do not stand or sit up quickly, especially if you are an older patient. This reduces the risk of dizzy or fainting spells. Alcohol may interfere with the effect of this medicine. Avoid alcoholic drinks. This medicine may cause dry eyes and blurred vision. If you wear contact lenses you may feel some discomfort. Lubricating drops may help. See your eye doctor if the problem does not go away or is severe. Your mouth may get dry. Chewing sugarless gum, sucking hard candy and drinking plenty of water may help. Contact your doctor if the problem does not go away or is severe. What side effects may I notice from receiving this medicine? Side effects that you should report to your doctor or health care professional as soon as possible:  allergic reactions like skin rash, itching or hives, swelling of the face, lips, or tongue  elevated mood, decreased need for sleep, racing thoughts, impulsive behavior  confusion  fast, irregular heartbeat  feeling faint or lightheaded, falls  feeling agitated, angry, or irritable  loss of balance or coordination  painful or prolonged erections  restlessness, pacing, inability to keep still  suicidal thoughts or other mood changes  tremors  trouble sleeping  seizures  unusual bleeding or bruising Side effects that usually do not require medical attention (report to your doctor  or health care professional if they continue or are bothersome):  change in sex drive or performance  change in appetite or weight  constipation  headache  muscle aches or pains  nausea This list may not describe all possible side effects. Call your doctor for medical advice about side effects. You may report side effects to FDA at 1-800-FDA-1088. Where should I keep my medicine? Keep out of the reach of children. Store at room temperature between 15 and 30 degrees C (59 to 86 degrees F). Protect from light. Keep container tightly closed. Throw away any unused medicine after the expiration date. NOTE: This sheet is a summary. It may not cover all possible information. If you have questions about this medicine, talk to your doctor, pharmacist, or health care provider.  2020 Elsevier/Gold Standard (2018-03-03 11:46:46)      Please be aware that some of the medications that you are on can sometimes cause a rare and potentially dangerous adverse reaction, called SEROTONIN SYNDROME: Symptoms of this condition include (but are not limited to):  Agitation or restlessness, confusion, rapid heart rate and high blood pressure, dilated pupils, loss of muscle coordination or twitching muscles, muscle rigidity/stiffness, sweating and/or flushing, diarrhea, headache, shivering, goose bumps. If you have any of these symptoms you may have to stop the medication. Call your health care provider immediately.  Severe serotonin syndrome can be life-threatening emergency. Signs and symptoms of a severe reaction may include: high fever, seizures, irregular heartbeat, unconsciousness or altered level of awareness  or personality changes.  If you have any of these new symptoms, call 911 or have someone take you to the emergency room.

## 2020-02-24 NOTE — Progress Notes (Signed)
Assessment and Plan:  Haley Sparks was seen today for medication management.  Diagnoses and all orders for this visit:  Anxiety/ Psychophysiological insomnia Start new medication as prescribed - start 1/2 tab 1 hour prior to bedtime, titrate up slowly if needed Continue wellbutrin with perceived benefit Did discuss risk of SS, information provided and advised to stop trazodone immediately if any concerns  Stress management techniques discussed, increase water, good sleep hygiene discussed, increase exercise, and increase veggies.  Follow up 1 month, call the office if any new AE's from medications and we will switch them -     ALPRAZolam (XANAX) 0.5 MG tablet; Take 1/2 to 1 tablet 2 to 3 x / day ONLY if needed for Anxiety Attack or Sleep  & please try to limit to 5 days / week to avoid addiction -     traZODone (DESYREL) 50 MG tablet; 1/2-2 tablet for sleep, 1 hour prior to bedtime.  Need for immunization against influenza -     FLU VACCINE MDCK QUAD W/Preservative  Acne, unspecified acne type -     tretinoin (RETIN-A) 0.05 % cream; Apply topically at bedtime.   Further disposition pending results of labs. Discussed med's effects and SE's.   Over 30 minutes of exam, counseling, chart review, and critical decision making was performed.   Future Appointments  Date Time Provider [place]  03/20/2020  7:50 AM [facility]  05/31/2020  4:15 PM Haley Comber, NP [facility] None  11/28/2020 10:00 AM Haley Comber, NP [facility] None    ------------------------------------------------------------------------------------------------------------------   HPI 59 y.o.female presents for evaluation due to concerns with worsening anxiety and difficulty sleeping. Also requesting increase in tretinoin dose due to increased acne with mask.   She reports 2 months of feeling on edge and agitated, difficulty sleeping, mind racing. Has been very stressed since work advised  would be transitioning back to in office, and also with mom's health not being [name]. Hx of depression/anxiety, ADD, currently on adderall 10-20 mg BID PRN on days she works, wellbutrin 150 mg was added in Sept 2021 and does feel this helped with mood during the day, has xanax but limits use, 0.5 mg tabs and avoids using daily, <5 days/week, not taking at night. She reports very hard to sleep, taking 3 tylenol PM and still wide awake with mind racing.   PHQ- 9 of 9 today, GAD-7 of 13  Past Medical History:  Diagnosis Date   Allergy    Anxiety    Barrett's esophagus    Depression    GERD (gastroesophageal reflux disease)    History of bladder surgery    Liver cyst 06/07/2015   Benign appearing liver cysts noted incidentally on multiple imaging scans. No further workup recommended.    Miscarriage    twice   Peptic ulcer disease    Renal disorder    kidney stones     Allergies  Allergen Reactions   Brintellix [Vortioxetine] Nausea Only    Current Outpatient Medications on File Prior to Visit  Medication Sig   amphetamine-dextroamphetamine (ADDERALL) 20 MG tablet Take 1/2 tablet 2 x /day for Focus & Concentration   aspirin EC 81 MG tablet Take 1 tablet (81 mg total) by mouth daily. Swallow whole.   buPROPion (WELLBUTRIN XL) 150 MG 24 hr tablet Take 1 tablet (150 mg total) by mouth every morning. For mood/depression.   butalbital-acetaminophen-caffeine (FIORICET) 50-325-40 MG tablet Take 1 tablet by mouth every 6 (six) hours as needed for  headache.   dexlansoprazole (DEXILANT) 60 MG capsule Take 60 mg by mouth daily.   estradiol (ESTRACE) 2 MG tablet Take 1 tablet (2 mg total) by mouth daily.   ibuprofen (ADVIL,MOTRIN) 400 MG tablet Take 1 tablet (400 mg total) by mouth every 6 (six) hours as needed.   No current facility-administered medications on file prior to visit.    ROS: all negative except above.   Physical Exam:  BP 134/88    Pulse 73    Temp (!) 97.5 F  (36.4 C)    Wt 128 lb (58.1 kg)    SpO2 98%    BMI 21.97 kg/m   General Appearance: Well nourished, in no apparent distress. Eyes: PERRLA, EOMs, conjunctiva no swelling or erythema Sinuses: No Frontal/maxillary tenderness ENT/Mouth: Ext aud canals clear, TMs without erythema, bulging. No erythema, swelling, or exudate on post pharynx.  Tonsils not swollen or erythematous. Hearing normal.  Neck: Supple, thyroid normal.  Respiratory: Respiratory effort normal, BS equal bilaterally without rales, rhonchi, wheezing or stridor.  Cardio: RRR with no MRGs. Brisk peripheral pulses without edema.  Abdomen: Soft, + BS.  Non tender Lymphatics: Non tender without lymphadenopathy.  Musculoskeletal: Full ROM, 5/5 strength, normal gait.  Skin: Warm, dry; she has scattered pustules to forehead and bil cheeks Neuro: Cranial nerves intact. Normal muscle tone, no cerebellar symptoms. Sensation intact.  Psych: Awake and oriented X 3, normal affect, Insight and Judgment appropriate.     Haley Ribas, NP 12:46 PM [facility]

## 2020-02-29 ENCOUNTER — Other Ambulatory Visit: Payer: Self-pay | Admitting: Internal Medicine

## 2020-02-29 DIAGNOSIS — L709 Acne, unspecified: Secondary | ICD-10-CM

## 2020-02-29 MED ORDER — TRETINOIN 0.05 % EX CREA: TOPICAL_CREAM | Freq: Every day | CUTANEOUS | 0 refills | Status: DC

## 2020-03-08 ENCOUNTER — Ambulatory Visit: Payer: BC Managed Care – PPO | Admitting: Adult Health Nurse Practitioner

## 2020-03-20 ENCOUNTER — Ambulatory Visit
Admission: RE | Admit: 2020-03-20 | Discharge: 2020-03-20 | Disposition: A | Discharge status: Home / Self Care | Payer: BC Managed Care – PPO | Source: Ambulatory Visit | Attending: Adult Health | Admitting: Adult Health

## 2020-03-20 ENCOUNTER — Other Ambulatory Visit: Payer: Self-pay

## 2020-03-20 ENCOUNTER — Other Ambulatory Visit: Payer: Self-pay | Admitting: Adult Health

## 2020-03-20 DIAGNOSIS — Z1231 Encounter for screening mammogram for malignant neoplasm of breast: Secondary | ICD-10-CM

## 2020-03-29 ENCOUNTER — Ambulatory Visit: Payer: BC Managed Care – PPO | Admitting: Adult Health

## 2020-05-04 ENCOUNTER — Other Ambulatory Visit: Payer: Self-pay

## 2020-05-04 MED ORDER — ESTRADIOL 2 MG PO TABS
2.0000 mg | ORAL_TABLET | Freq: Every day | ORAL | 3 refills | Status: DC
Start: 2020-05-04 — End: 2021-03-29

## 2020-05-08 ENCOUNTER — Other Ambulatory Visit: Payer: Self-pay

## 2020-05-08 ENCOUNTER — Ambulatory Visit: Payer: BC Managed Care – PPO | Admitting: Adult Health

## 2020-05-08 ENCOUNTER — Encounter: Payer: Self-pay | Admitting: Adult Health

## 2020-05-08 VITALS — BP 130/80 | HR 83 | Temp 97.7°F | Wt 129.0 lb

## 2020-05-08 DIAGNOSIS — F339 Major depressive disorder, recurrent, unspecified: Secondary | ICD-10-CM

## 2020-05-08 DIAGNOSIS — F5104 Psychophysiologic insomnia: Secondary | ICD-10-CM

## 2020-05-08 DIAGNOSIS — R809 Proteinuria, unspecified: Secondary | ICD-10-CM | POA: Diagnosis not present

## 2020-05-08 MED ORDER — AMPHETAMINE-DEXTROAMPHETAMINE 20 MG PO TABS: ORAL_TABLET | ORAL | 0 refills | Status: DC

## 2020-05-08 MED ORDER — TRAZODONE HCL 50 MG PO TABS: ORAL_TABLET | ORAL | 0 refills | Status: DC

## 2020-05-08 NOTE — Progress Notes (Signed)
Assessment and Plan:  Haley Sparks was seen today for medication management.  Diagnoses and all orders for this visit:  Anxiety/ Psychophysiological insomnia After discussion, retry trazodone 25 mg but take 1-2 full hours prior to bedtime If cannot tolerate, ok to continue with benadryl and infrequent xanax use in the evening Continue wellbutrin with perceived benefit Stress management techniques discussed, increase water, good sleep hygiene discussed, increase exercise, and increase veggies.  Call the office if any new AE's from medications and we will switch them -     traZODone (DESYREL) 50 MG tablet; 1/2-2 tablet for sleep, 1-2 hour prior to bedtime.  ADD Continue medications Helps with focus, no AE's. The patient was counseled on the addictive nature of the medication and was encouraged to take drug holidays when not needed.  -     amphetamine-dextroamphetamine (ADDERALL) 20 MG tablet; Take 1/2 tablet 2 x /day for Focus & Concentration  Acne, unspecified acne type -     tretinoin (RETIN-A) 0.05 % cream; Apply topically at bedtime.  Proteinuria      Recheck for persistence; denies aggressive exercise in the last 24 hours; house work or walking only   -     UA    Further disposition pending results of labs. Discussed med's effects and SE's.   Over 15 minutes of exam, counseling, chart review, and critical decision making was performed.   Future Appointments  Date Time Provider [place]  05/31/2020  4:00 PM [name], NP [facility] None  11/28/2020 10:00 AM [name], NP [facility] None    ------------------------------------------------------------------------------------------------------------------   HPI 60 y.o.female presents for evaluation for follow up on mood/insomnia and acne.   She was reporting several months of feeling on edge and agitated, difficulty sleeping, mind racing. Has been very stressed since work advised would be transitioning back to  in office, and also with mom's health not being [place]. Hx of depression/anxiety, ADD, currently on adderall 10-20 mg BID PRN on days she works, wellbutrin 150 mg was added in Sept 2021 and does feel this helped with mood during the day, has xanax but limits use, 0.5 mg tabs and avoids using daily, <5 days/week, not taking at night.   We added trazodone 50 mg tabs PM last visit, she started with 1/2 tab 30 min prior to bedtime and reports didn't continue due to brain fog in the AM. Reports has taken gabapentin in the past with similar issue. She takes benadryl some evenings and does fairly with this. Will use xanax if severe.   She takes 1/2 tab adderall once daily, rarely second tab in hour if running late, takes Mon-Fri without SE. Has taken for many years without affecting sleep.   Also increased tretinoin dose due to increased acne with mask, reports doing well with 0.05% daily.   Past Medical History:  Diagnosis Date  . Allergy   . Anxiety   . Barrett's esophagus   . Depression   . GERD (gastroesophageal reflux disease)   . History of bladder surgery   . Liver cyst 06/07/2015   Benign appearing liver cysts noted incidentally on multiple imaging scans. No further workup recommended.   . Miscarriage    twice  . Peptic ulcer disease   . Renal disorder    kidney stones     Allergies  Allergen Reactions  . Brintellix [Vortioxetine] Nausea Only    Current Outpatient Medications on File Prior to Visit  Medication Sig  . ALPRAZolam (XANAX) 0.5 MG tablet Take 1/2  to 1 tablet 2 to 3 x / day ONLY if needed for Anxiety Attack or Sleep  & please try to limit to 5 days / week to avoid addiction  . amphetamine-dextroamphetamine (ADDERALL) 20 MG tablet Take 1/2 tablet 2 x /day for Focus & Concentration  . aspirin EC 81 MG tablet Take 1 tablet (81 mg total) by mouth daily. Swallow whole.  Marland Kitchen buPROPion (WELLBUTRIN XL) 150 MG 24 hr tablet Take      1 tablet       Daily       for Mood, Focus &  Cincentration  . butalbital-acetaminophen-caffeine (FIORICET) 50-325-40 MG tablet Take 1 tablet by mouth every 6 (six) hours as needed for headache.  . dexlansoprazole (DEXILANT) 60 MG capsule Take 60 mg by mouth daily.  Marland Kitchen estradiol (ESTRACE) 2 MG tablet Take 1 tablet (2 mg total) by mouth daily.  Marland Kitchen ibuprofen (ADVIL,MOTRIN) 400 MG tablet Take 1 tablet (400 mg total) by mouth every 6 (six) hours as needed.  . tretinoin (RETIN-A) 0.05 % cream Apply topically at bedtime. Apply Daily to face for Rosacea  . traZODone (DESYREL) 50 MG tablet 1/2-2 tablet for sleep, 1 hour prior to bedtime. (Patient not taking: Reported on 05/08/2020)   No current facility-administered medications on file prior to visit.    ROS: all negative except above.   Physical Exam:  BP 130/80   Pulse 83   Temp 97.7 F (36.5 C)   Wt 129 lb (58.5 kg)   SpO2 97%   BMI 22.14 kg/m   General Appearance: Well nourished, in no apparent distress. Eyes: PERRLA, EOMs, conjunctiva no swelling or erythema Sinuses: No Frontal/maxillary tenderness ENT/Mouth: Ext aud canals clear, TMs without erythema, bulging. No erythema, swelling, or exudate on post pharynx.  Tonsils not swollen or erythematous. Hearing normal.  Neck: Supple, thyroid normal.  Respiratory: Respiratory effort normal, BS equal bilaterally without rales, rhonchi, wheezing or stridor.  Cardio: RRR with no MRGs. Brisk peripheral pulses without edema.  Abdomen: Soft, + BS.  Non tender Lymphatics: Non tender without lymphadenopathy.  Musculoskeletal: Full ROM, 5/5 strength, normal gait.  Skin: Warm, dry; no active pustules at this time Neuro: Cranial nerves intact. Normal muscle tone, no cerebellar symptoms. Sensation intact.  Psych: Awake and oriented X 3, normal affect, Insight and Judgment appropriate.     [name], NP 11:46 AM [name]

## 2020-05-09 LAB — URINALYSIS, ROUTINE W REFLEX MICROSCOPIC
Bilirubin Urine: NEGATIVE
Glucose, UA: NEGATIVE
Hgb urine dipstick: NEGATIVE
Ketones, ur: NEGATIVE
Leukocytes,Ua: NEGATIVE
Nitrite: NEGATIVE
Protein, ur: NEGATIVE
Specific Gravity, Urine: 1.008 (ref 1.001–1.03)
pH: <=5 (ref 5.0–8.0)

## 2020-05-31 ENCOUNTER — Ambulatory Visit: Payer: BC Managed Care – PPO | Admitting: Adult Health

## 2020-06-01 ENCOUNTER — Other Ambulatory Visit: Payer: Self-pay | Admitting: Adult Health

## 2020-06-01 DIAGNOSIS — L709 Acne, unspecified: Secondary | ICD-10-CM

## 2020-06-01 MED ORDER — TRETINOIN 0.1 % EX CREA: TOPICAL_CREAM | Freq: Every day | CUTANEOUS | 0 refills | Status: DC

## 2020-06-06 ENCOUNTER — Other Ambulatory Visit: Payer: Self-pay

## 2020-06-06 MED ORDER — BUPROPION HCL ER (XL) 150 MG PO TB24: ORAL_TABLET | ORAL | 0 refills | Status: DC

## 2020-07-20 ENCOUNTER — Other Ambulatory Visit: Payer: Self-pay

## 2020-07-20 ENCOUNTER — Other Ambulatory Visit: Payer: Self-pay | Admitting: Adult Health

## 2020-07-20 DIAGNOSIS — F419 Anxiety disorder, unspecified: Secondary | ICD-10-CM

## 2020-07-20 DIAGNOSIS — F339 Major depressive disorder, recurrent, unspecified: Secondary | ICD-10-CM

## 2020-07-20 DIAGNOSIS — G44209 Tension-type headache, unspecified, not intractable: Secondary | ICD-10-CM

## 2020-07-20 MED ORDER — BUTALBITAL-APAP-CAFFEINE 50-325-40 MG PO TABS: 1.0000 | ORAL_TABLET | Freq: Four times a day (QID) | ORAL | 0 refills | Status: DC | PRN

## 2020-07-20 MED ORDER — ALPRAZOLAM 0.5 MG PO TABS: ORAL_TABLET | ORAL | 0 refills | Status: DC

## 2020-07-20 MED ORDER — AMPHETAMINE-DEXTROAMPHETAMINE 20 MG PO TABS
ORAL_TABLET | ORAL | 0 refills | Status: DC
Start: 2020-07-20 — End: 2020-11-01

## 2020-07-20 NOTE — Progress Notes (Signed)
Future Appointments  Date Time Provider [place]  11/28/2020 10:00 AM [name], NP [facility] None    PDMP reviewed for adderall refill request.

## 2020-09-14 ENCOUNTER — Other Ambulatory Visit: Payer: Self-pay | Admitting: Adult Health

## 2020-11-01 ENCOUNTER — Other Ambulatory Visit: Payer: Self-pay

## 2020-11-01 DIAGNOSIS — F339 Major depressive disorder, recurrent, unspecified: Secondary | ICD-10-CM

## 2020-11-01 MED ORDER — AMPHETAMINE-DEXTROAMPHETAMINE 20 MG PO TABS
ORAL_TABLET | ORAL | 0 refills | Status: DC
Start: 2020-11-01 — End: 2020-12-13

## 2020-11-08 ENCOUNTER — Other Ambulatory Visit: Payer: Self-pay | Admitting: Nurse Practitioner

## 2020-11-08 DIAGNOSIS — L709 Acne, unspecified: Secondary | ICD-10-CM

## 2020-11-08 MED ORDER — ADAPALENE-BENZOYL PEROXIDE 0.1-2.5 % EX GEL: CUTANEOUS | 3 refills | Status: AC

## 2020-11-28 ENCOUNTER — Encounter: Payer: BC Managed Care – PPO | Admitting: Adult Health

## 2020-12-06 ENCOUNTER — Other Ambulatory Visit: Payer: Self-pay

## 2020-12-06 DIAGNOSIS — F339 Major depressive disorder, recurrent, unspecified: Secondary | ICD-10-CM

## 2020-12-13 ENCOUNTER — Other Ambulatory Visit: Payer: Self-pay | Admitting: Adult Health

## 2020-12-13 MED ORDER — DEXTROAMPHETAMINE SULFATE 20 MG PO TABS: ORAL_TABLET | ORAL | 0 refills | Status: DC

## 2020-12-18 ENCOUNTER — Telehealth: Payer: Self-pay

## 2020-12-18 NOTE — Telephone Encounter (Signed)
Prior authorization for Dextroamphetamine was denied.

## 2021-01-05 NOTE — Telephone Encounter (Signed)
Needs prior auth for Adderall

## 2021-01-15 ENCOUNTER — Other Ambulatory Visit: Payer: Self-pay

## 2021-01-15 DIAGNOSIS — F419 Anxiety disorder, unspecified: Secondary | ICD-10-CM

## 2021-01-15 MED ORDER — ALPRAZOLAM 0.5 MG PO TABS
ORAL_TABLET | ORAL | 0 refills | Status: DC
Start: 2021-01-15 — End: 2021-05-18

## 2021-01-16 ENCOUNTER — Other Ambulatory Visit: Payer: Self-pay | Admitting: Adult Health

## 2021-01-16 MED ORDER — DEXTROAMPHETAMINE SULFATE 10 MG PO TABS: ORAL_TABLET | ORAL | 0 refills | Status: DC

## 2021-01-16 NOTE — Progress Notes (Signed)
No future appointments.  PDMP reviewed for adderall refill request. Note sent to front desk to schedule follow up OV, will need prior to further refills.

## 2021-01-17 ENCOUNTER — Other Ambulatory Visit: Payer: Self-pay

## 2021-01-17 DIAGNOSIS — G44209 Tension-type headache, unspecified, not intractable: Secondary | ICD-10-CM

## 2021-03-13 ENCOUNTER — Other Ambulatory Visit: Payer: Self-pay

## 2021-03-13 ENCOUNTER — Ambulatory Visit
Admission: EM | Admit: 2021-03-13 | Discharge: 2021-03-13 | Disposition: A | Discharge status: Home / Self Care | Payer: BC Managed Care – PPO | Attending: Physician Assistant | Admitting: Physician Assistant

## 2021-03-13 ENCOUNTER — Encounter: Payer: Self-pay | Admitting: Emergency Medicine

## 2021-03-13 DIAGNOSIS — J019 Acute sinusitis, unspecified: Secondary | ICD-10-CM

## 2021-03-13 MED ORDER — AMOXICILLIN-POT CLAVULANATE 875-125 MG PO TABS: 1.0000 | ORAL_TABLET | Freq: Two times a day (BID) | ORAL | 0 refills | Status: DC

## 2021-03-13 NOTE — ED Triage Notes (Signed)
Patient c/o non-productive cough, nasal congestion, watery eyes x 1 week.  Patient has taken Advil Cold & Sinus, Mucinex Cold & Flu.  Home COVID test negative.  Patient is vaccinated for COVID.

## 2021-03-13 NOTE — ED Provider Notes (Signed)
[name]    CSN: [id number] Arrival date & time: 03/13/21  0806      History   Chief Complaint Chief Complaint  Patient presents with   Cough    HPI Haley Sparks is a 60 y.o. female.   Patient here today for evaluation of nonproductive cough, congestion and watery eyes she has had for the last week.  She has tried over-the-counter medication without significant relief.  She has taken COVID test at home with negative results.  The history is provided by the patient.  Cough Associated symptoms: sore throat   Associated symptoms: no chills, no ear pain, no eye discharge, no fever, no shortness of breath and no wheezing    Past Medical History:  Diagnosis Date   Allergy    Anxiety    Barrett's esophagus    Depression    GERD (gastroesophageal reflux disease)    History of bladder surgery    Liver cyst 06/07/2015   Benign appearing liver cysts noted incidentally on multiple imaging scans. No further workup recommended.    Miscarriage    twice   Peptic ulcer disease    Renal disorder    kidney stones    Patient Active Problem List   Diagnosis Date Noted   B12 deficiency 11/27/2019   Elevated BP without diagnosis of hypertension 11/26/2019   Osteopenia 11/26/2019   Right elbow pain 05/26/2019   DDD (degenerative disc disease), cervical 03/05/2017   Barrett's esophagus 03/14/2015   Hyperlipidemia LDL goal <100 09/12/2014   History of dysplastic nevus 09/12/2014   Vitamin D deficiency 09/12/2014   Gastroesophageal reflux disease with esophagitis 09/12/2014   Stress headaches 05/18/2014   Environmental allergies    Anxiety    Depression, recurrent ([place])     Past Surgical History:  Procedure Laterality Date   ABDOMINAL HYSTERECTOMY  2004   has 1 ovary left, Dr. Corinna Capra   BLADDER SUSPENSION     CESAREAN SECTION     TONSILLECTOMY      OB History   No obstetric history on file.      Home Medications    Prior to Admission medications    Medication Sig Start Date End Date Taking? Authorizing Provider  Adapalene-Benzoyl Peroxide (EPIDUO) 0.1-2.5 % gel APPLY A SMALL AMOUNT AT BEDTIME AS NEEDED 11/08/20  Yes Magda Bernheim, NP  ALPRAZolam Duanne Moron) 0.5 MG tablet Take 1/2 to 1 tablet 2 to 3 x / day ONLY if needed for Anxiety Attack or Sleep  & please try to limit to 5 days / week to avoid addiction 01/15/21  Yes Magda Bernheim, NP  amoxicillin-clavulanate (AUGMENTIN) 875-125 MG tablet Take 1 tablet by mouth every 12 (twelve) hours. 03/13/21  Yes Francene Finders, PA-C  buPROPion (WELLBUTRIN XL) 150 MG 24 hr tablet Take  1 tablet  Daily for Mood, Focus & Concentration 09/14/20  Yes Unk Pinto, MD  estradiol (ESTRACE) 2 MG tablet Take 1 tablet (2 mg total) by mouth daily. 05/04/20  Yes [name], NP  ibuprofen (ADVIL,MOTRIN) 400 MG tablet Take 1 tablet (400 mg total) by mouth every 6 (six) hours as needed. 05/03/18  Yes Nanavati, Ankit, MD  tretinoin (RETIN-A) 0.1 % cream Apply topically at bedtime. 06/01/20  Yes [name], NP  butalbital-acetaminophen-caffeine (FIORICET) [phone] MG tablet Take 1 tablet by mouth every 6 (six) hours as needed for headache. 07/20/20   [name], NP  dexlansoprazole (DEXILANT) 60 MG capsule Take 60 mg by mouth daily.  [provider]  dextroamphetamine (DEXTROSTAT) 10 MG tablet Take 1/2-1 tab twice daily as needed for focus. 01/16/21   [name], NP  traZODone (DESYREL) 50 MG tablet 1/2-1 tablet for sleep, 1-2 hour prior to bedtime. 05/08/20   [name], NP    Family History Family History  Problem Relation Age of Onset   Hypertension Mother    Hypertension Father    Heart disease Father    Hyperlipidemia Father    COPD Maternal Grandmother    Pulmonary embolism Maternal Grandmother    Asthma Paternal Grandmother    Heart disease Paternal Grandfather     Social History Social History   Tobacco Use   Smoking status: Passive Smoke Exposure - Never Smoker    Smokeless tobacco: Never   Tobacco comments:    Smoke exposure as a child through parents.  Vaping Use   Vaping Use: Never used  Substance Use Topics   Alcohol use: Yes    Alcohol/week: 3.0 standard drinks    Types: 3 Glasses of wine per week   Drug use: No     Allergies   Brintellix [vortioxetine]   Review of Systems Review of Systems  Constitutional:  Negative for chills and fever.  HENT:  Positive for congestion, sinus pressure and sore throat. Negative for ear pain.   Eyes:  Negative for discharge and redness.  Respiratory:  Positive for cough. Negative for shortness of breath and wheezing.   Gastrointestinal:  Negative for abdominal pain, diarrhea, nausea and vomiting.    Physical Exam Triage Vital Signs ED Triage Vitals  Enc Vitals Group     BP      Pulse      Resp      Temp      Temp src      SpO2      Weight      Height      Head Circumference      Peak Flow      Pain Score      Pain Loc      Pain Edu?      Excl. in [place]?    No data found.  Updated Vital Signs BP 121/81 (BP Location: Right Arm)    Pulse 91    Temp 98.3 F (36.8 C) (Oral)    Ht 5\' 4"  (1.626 m)    Wt 128 lb 15.5 oz (58.5 kg)    SpO2 98%    BMI 22.14 kg/m   Physical Exam Vitals and nursing note reviewed.  Constitutional:      General: She is not in acute distress.    Appearance: Normal appearance. She is not ill-appearing.  HENT:     Head: Normocephalic and atraumatic.     Right Ear: Tympanic membrane normal.     Left Ear: Tympanic membrane normal.     Nose: Congestion present.     Mouth/Throat:     Mouth: Mucous membranes are moist.     Pharynx: No oropharyngeal exudate or posterior oropharyngeal erythema.  Eyes:     Conjunctiva/sclera: Conjunctivae normal.  Cardiovascular:     Rate and Rhythm: Normal rate and regular rhythm.     Heart sounds: Normal heart sounds. No murmur heard. Pulmonary:     Effort: Pulmonary effort is normal. No respiratory distress.     Breath sounds:  Normal breath sounds. No wheezing, rhonchi or rales.  Skin:    General: Skin is warm and dry.  Neurological:     Mental  Status: She is alert.  Psychiatric:        Mood and Affect: Mood normal.        Thought Content: Thought content normal.     UC Treatments / Results  Labs (all labs ordered are listed, but only abnormal results are displayed) Labs Reviewed - No data to display  EKG   Radiology No results found.  Procedures Procedures (including critical care time)  Medications Ordered in UC Medications - No data to display  Initial Impression / Assessment and Plan / UC Course  I have reviewed the triage vital signs and the nursing notes.  Pertinent labs & imaging results that were available during my care of the patient were reviewed by me and considered in my medical decision making (see chart for details).    Suspect likely sinusitis given duration of symptoms, will treat with Augmentin and recommend follow-up if symptoms fail to improve or worsen.  Final Clinical Impressions(s) / UC Diagnoses   Final diagnoses:  Acute sinusitis, recurrence not specified, unspecified location   Discharge Instructions   None    ED Prescriptions     Medication Sig Dispense Auth. Provider   amoxicillin-clavulanate (AUGMENTIN) 875-125 MG tablet Take 1 tablet by mouth every 12 (twelve) hours. 14 tablet Francene Finders, PA-C      PDMP not reviewed this encounter.   Francene Finders, PA-C 03/13/21 [phone]

## 2021-03-24 NOTE — Progress Notes (Deleted)
FOLLOW UP  Assessment and Plan:   Elevated BP without dx of htn Intermittently elevated on review; well controlled today  Monitor blood pressure at home; call if consistently over 130/80 Continue DASH diet.   Reminder to go to the ER if any CP, SOB, nausea, dizziness, severe HA, changes vision/speech, left arm numbness and tingling and jaw pain.   Hyperlipidemia LDL goal <100 - check lipids, decrease fatty foods, increase activity, wants to avoid medications  - low risk, had normal vessels on CTA 2017 - CBC with Differential/Platelet - CMP/GFR - Lipid panel   Vitamin D deficiency - VITAMIN D 25 Hydroxy (Vit-D Deficiency, Fractures)  Medication management - Magnesium  Barrett's esophagus with dysplasia -controlled, continue the same medications, follow up Dr. Collier Salina NEEDS TO STOP GOODY POWDERS, NSAIDS ***  Gastroesophageal reflux disease with esophagitis Continue PPI/H2 blocker, diet discussed, follow up Dr. Collier Salina   Dysplastic nevus Continue fu derm   Depression, recurrent ([place]) Wellbutrin/trazodone ***xanax  Lifestyle discussed: diet/exerise, sleep hygiene, stress management, hydration  Insomnia Trazodone/xanax ***  Stress headaches  Will treat mood, stress management discussed discussed PT, massage, heat application     Discussed med's effects and SE's. Labs and tests as requested with regular follow-up as recommended. Over 30 minutes of exam, counseling, chart review, and complex, high level critical decision making was performed this visit.   Future Appointments  Date Time Provider [place]  03/27/2021  4:00 PM [name], NP [facility] None     HPI  60 y.o. female  presents for follow up on depression, insomnia, GERD/Barrett's, hld, headaches, vit D def.   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 Very busy year, MIL has end stage CHF and not doing well, got new dog, youngest daughter got married in May  Hx of depression, back on  wellbutrin, most recently added trazodone due to xanax use for sleep.  Has xanax but rarely uses during the day, typically takes to sleep at night. Hasn't needed in a a while. Has tried gabapentin before and had too much fogginess.   She takes 1/2 tab adderall (*** once daily, rarely second tab in hour if running late, takes Mon-Fri without SE.    She has headaches associated with stress; uses Excedrin migraine; admits occasional goody powder use.  Has hx of GERD with Barrett's esophagus, hx of ulcers, followed by Dr. Collier Salina. She is on dexilant 60 mg daily.   She has neck pain, arthritis, with intermittent R radicular symptoms, following with neurology and getting shots, reports intermittent sx this year, manageable.  BMI is There is no height or weight on file to calculate BMI., she has been working on diet and exercise, walking, helps with mood, 4-6 miles daily.   Has reduced alcohol intake from 8/week to 2-3 per week.  Wt Readings from Last 3 Encounters:  03/13/21 128 lb 15.5 oz (58.5 kg)  05/08/20 129 lb (58.5 kg)  02/24/20 128 lb (58.1 kg)   Her blood pressure has been controlled at home, today their BP is   She does workout, walks daily. She denies chest pain, shortness of breath, dizziness.   She had CTA in 2017 which showed normal caliber arteries and no atherosclerosis  She is not on cholesterol medication and denies myalgias. Her cholesterol is not at goal. The cholesterol last visit was:   Lab Results  Component Value Date   CHOL 232 (H) 11/26/2019   HDL 89 11/26/2019   LDLCALC 127 (H) 11/26/2019  TRIG 69 11/26/2019   CHOLHDL 2.6 11/26/2019   Last A1C in the office was:  Lab Results  Component Value Date   HGBA1C 5.0 11/26/2019   Lab Results  Component Value Date   GFRNONAA 99 12/14/2019   Patient is not on Vitamin D supplement.  Lab Results  Component Value Date   VD25OH 24 (L) 11/26/2019      Lab Results  Component Value Date   VITAMINB12 340 11/26/2019      Current Medications:  Current Outpatient Medications on File Prior to Visit  Medication Sig Dispense Refill   Adapalene-Benzoyl Peroxide (EPIDUO) 0.1-2.5 % gel APPLY A SMALL AMOUNT AT BEDTIME AS NEEDED 45 g 3   ALPRAZolam (XANAX) 0.5 MG tablet Take 1/2 to 1 tablet 2 to 3 x / day ONLY if needed for Anxiety Attack or Sleep  & please try to limit to 5 days / week to avoid addiction 60 tablet 0   amoxicillin-clavulanate (AUGMENTIN) 875-125 MG tablet Take 1 tablet by mouth every 12 (twelve) hours. 14 tablet 0   buPROPion (WELLBUTRIN XL) 150 MG 24 hr tablet Take  1 tablet  Daily for Mood, Focus & Concentration 90 tablet 3   butalbital-acetaminophen-caffeine (FIORICET) 50-325-40 MG tablet Take 1 tablet by mouth every 6 (six) hours as needed for headache. 30 tablet 0   dexlansoprazole (DEXILANT) 60 MG capsule Take 60 mg by mouth daily.     dextroamphetamine (DEXTROSTAT) 10 MG tablet Take 1/2-1 tab twice daily as needed for focus. 60 tablet 0   estradiol (ESTRACE) 2 MG tablet Take 1 tablet (2 mg total) by mouth daily. 90 tablet 3   ibuprofen (ADVIL,MOTRIN) 400 MG tablet Take 1 tablet (400 mg total) by mouth every 6 (six) hours as needed. 30 tablet 0   traZODone (DESYREL) 50 MG tablet 1/2-1 tablet for sleep, 1-2 hour prior to bedtime. 60 tablet 0   tretinoin (RETIN-A) 0.1 % cream Apply topically at bedtime. 45 g 0   No current facility-administered medications on file prior to visit.   Allergies:  Allergies  Allergen Reactions   Brintellix [Vortioxetine] Nausea Only   Medical History:  Shehas Environmental allergies; Anxiety; Depression, recurrent ([place]); Stress headaches; Hyperlipidemia LDL goal <100; History of dysplastic nevus; Vitamin D deficiency; Gastroesophageal reflux disease with esophagitis; Barrett's esophagus; DDD (degenerative disc disease), cervical; Right elbow pain; Elevated BP without diagnosis of hypertension; Osteopenia; and B12 deficiency on their problem list.   Surgical  History:  She  has a past surgical history that includes Abdominal hysterectomy (2004); Cesarean section; Bladder suspension; and Tonsillectomy. Family History:  She had a family history includes Asthma in her paternal grandmother; COPD in her maternal grandmother; Heart disease in her father and paternal grandfather; Hyperlipidemia in her father; Hypertension in her father and mother; Pulmonary embolism in her maternal grandmother. Social History:  She  reports that she is a non-smoker but has been exposed to tobacco smoke. She has never used smokeless tobacco. She reports current alcohol use of about 3.0 standard drinks per week. She reports that she does not use drugs.  *** Review of Systems: Review of Systems  Constitutional: Negative.  Negative for malaise/fatigue and weight loss.  HENT: Negative.  Negative for hearing loss and tinnitus.   Eyes: Negative.  Negative for blurred vision and double vision.  Respiratory: Negative.  Negative for cough, shortness of breath and wheezing.   Cardiovascular: Negative.  Negative for chest pain, palpitations, orthopnea, claudication and leg swelling.  Gastrointestinal:  Positive for  heartburn (intermittent, after trigger foods, takes tums). Negative for abdominal pain, blood in stool, constipation, diarrhea, melena, nausea and vomiting.  Genitourinary: Negative.  Negative for dysuria, flank pain, frequency, hematuria and urgency.  Musculoskeletal:  Positive for neck pain (at night, some intermittent right radicular). Negative for joint pain and myalgias.  Skin: Negative.  Negative for rash.  Neurological:  Positive for headaches (nearly daily; frequently AM). Negative for dizziness, tingling, sensory change and weakness.  Endo/Heme/Allergies: Negative.  Negative for polydipsia.  Psychiatric/Behavioral:  Positive for depression. Negative for hallucinations, memory loss, substance abuse and suicidal ideas. The patient is not nervous/anxious and does not  have insomnia.   All other systems reviewed and are negative.  Physical Exam: Estimated body mass index is 22.14 kg/m as calculated from the following:   Height as of 03/13/21: 5\' 4"  (1.626 m).   Weight as of 03/13/21: 128 lb 15.5 oz (58.5 kg). There were no vitals taken for this visit. General Appearance: Well nourished, in no apparent distress.  Eyes: PERRLA, EOMs, conjunctiva no swelling or erythema  Sinuses: No Frontal/maxillary tenderness  ENT/Mouth: Ext aud canals clear, normal light reflex with TMs without erythema, bulging. Good dentition. No erythema, swelling, or exudate on post pharynx. Tonsils not swollen or erythematous. Hearing normal. + TMJ Neck: Supple, thyroid normal. No bruits  Respiratory: Respiratory effort normal, BS equal bilaterally without rales, rhonchi, wheezing or stridor.  Cardio: RRR without murmurs, rubs or gallops. Brisk peripheral pulses without edema.  Abdomen: Soft, nontender, no guarding, rebound, hernias, masses, or organomegaly.  Lymphatics: Non tender without lymphadenopathy.  Musculoskeletal: Full ROM all peripheral extremities,5/5 strength, and normal gait., normal distal neuro Skin: Warm, dry without rashes, lesions, ecchymosis. Neuro: Cranial nerves intact, reflexes equal bilaterally. Normal muscle tone, no cerebellar symptoms. Sensation intact.  Psych: Awake and oriented X 3, normal affect, Insight and Judgment appropriate.    Haley Sparks Haley Sparks 8:40 AM [facility]

## 2021-03-27 ENCOUNTER — Ambulatory Visit: Payer: BC Managed Care – PPO | Admitting: Adult Health

## 2021-03-27 DIAGNOSIS — Z79899 Other long term (current) drug therapy: Secondary | ICD-10-CM

## 2021-03-27 DIAGNOSIS — F339 Major depressive disorder, recurrent, unspecified: Secondary | ICD-10-CM

## 2021-03-27 DIAGNOSIS — F4541 Pain disorder exclusively related to psychological factors: Secondary | ICD-10-CM

## 2021-03-27 DIAGNOSIS — K2100 Gastro-esophageal reflux disease with esophagitis, without bleeding: Secondary | ICD-10-CM

## 2021-03-27 DIAGNOSIS — E538 Deficiency of other specified B group vitamins: Secondary | ICD-10-CM

## 2021-03-27 DIAGNOSIS — E785 Hyperlipidemia, unspecified: Secondary | ICD-10-CM

## 2021-03-27 DIAGNOSIS — E559 Vitamin D deficiency, unspecified: Secondary | ICD-10-CM

## 2021-03-29 ENCOUNTER — Other Ambulatory Visit: Payer: Self-pay

## 2021-03-29 ENCOUNTER — Encounter: Payer: Self-pay | Admitting: Adult Health

## 2021-03-29 ENCOUNTER — Ambulatory Visit: Payer: BC Managed Care – PPO | Admitting: Adult Health

## 2021-03-29 VITALS — BP 120/80 | HR 93 | Temp 97.5°F | Wt 136.0 lb

## 2021-03-29 DIAGNOSIS — E559 Vitamin D deficiency, unspecified: Secondary | ICD-10-CM

## 2021-03-29 DIAGNOSIS — F339 Major depressive disorder, recurrent, unspecified: Secondary | ICD-10-CM

## 2021-03-29 DIAGNOSIS — K2100 Gastro-esophageal reflux disease with esophagitis, without bleeding: Secondary | ICD-10-CM | POA: Diagnosis not present

## 2021-03-29 DIAGNOSIS — E785 Hyperlipidemia, unspecified: Secondary | ICD-10-CM

## 2021-03-29 DIAGNOSIS — G44209 Tension-type headache, unspecified, not intractable: Secondary | ICD-10-CM

## 2021-03-29 DIAGNOSIS — R030 Elevated blood-pressure reading, without diagnosis of hypertension: Secondary | ICD-10-CM

## 2021-03-29 DIAGNOSIS — E538 Deficiency of other specified B group vitamins: Secondary | ICD-10-CM

## 2021-03-29 DIAGNOSIS — F988 Other specified behavioral and emotional disorders with onset usually occurring in childhood and adolescence: Secondary | ICD-10-CM

## 2021-03-29 MED ORDER — ESTRADIOL 2 MG PO TABS: 2.0000 mg | ORAL_TABLET | Freq: Every day | ORAL | 3 refills | Status: DC

## 2021-03-29 MED ORDER — BUTALBITAL-APAP-CAFFEINE 50-325-40 MG PO TABS: 1.0000 | ORAL_TABLET | Freq: Four times a day (QID) | ORAL | 0 refills | Status: DC | PRN

## 2021-03-29 MED ORDER — AMPHETAMINE-DEXTROAMPHETAMINE 20 MG PO TABS: ORAL_TABLET | ORAL | 0 refills | Status: DC

## 2021-03-29 MED ORDER — FLUCONAZOLE 150 MG PO TABS: 150.0000 mg | ORAL_TABLET | Freq: Once | ORAL | 3 refills | Status: AC

## 2021-03-29 NOTE — Patient Instructions (Signed)
Diflucan once, repeat in 1 week if needed for yeast  Restart vitamin D - 2000 IU daily   B12 - sublingual (dissolves under tongue) - 1000 mcg weekly or 250 mcg daily

## 2021-03-29 NOTE — Progress Notes (Signed)
FOLLOW UP  Assessment and Plan:   Elevated BP without dx of htn Intermittently elevated on review; well controlled today  Monitor blood pressure at home; call if consistently over 130/80 Continue DASH diet.   Reminder to go to the ER if any CP, SOB, nausea, dizziness, severe HA, changes vision/speech, left arm numbness and tingling and jaw pain.   Hyperlipidemia LDL goal <100 -had recent check, defer today - low risk, had normal vessels on CTA 2017   Vitamin D deficiency Restart supplement   Barrett's esophagus with dysplasia -controlled, continue the same medications, follow up Dr. Collier Salina AVOID NSAIDS  Gastroesophageal reflux disease with esophagitis Continue PPI/H2 blocker, diet discussed, follow up Dr. Collier Salina   Depression, recurrent [facility]) Doing well with wellbutrin Lifestyle discussed: diet/exerise, sleep hygiene, stress management, hydration  Insomnia Doing well with tylenol PM  Stress headaches  Will treat mood, stress management discussed discussed PT, massage, heat application  ADD Restart adderall as was taking Helps with focus, no AE's. The patient was counseled on the addictive nature of the medication and was encouraged to take drug holidays when not needed.   Vaginal yeast - diflucan discussed and sent in   Labs for recent work physical reviewed, low risk CBC, CMP, thyroid panel; lipid panel - labs deferred today  Discussed med's effects and SE's. Regular follow up as recommended.  Over 30 minutes of exam, counseling, chart review, and complex, high level critical decision making was performed this visit.   Future Appointments  Date Time Provider [place]  08/29/2021  9:00 AM [name], NP [facility] None     HPI  61 y.o. female  presents for follow up on depression, insomnia, GERD/Barrett's, hld, headaches, vit D def.   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 Very busy year.   Recently completed augmentin for sinusitis from  [facility]; reports vaginal discharge with itching since that time, not resolving with monistat.   Hx of depression, back on wellbutrin and improved, sleeping with tylenol PM.   She takes 20 mg adderall ,usually 1/2 tab once daily in AM, rarely full tab (temporarily on dextroamphetamine due to short supply that did not work well). Rarely will take a second tab if running late, takes Mon-Fri. Denies SE.  She has headaches associated with stress; uses Excedrin migraine; fioricet. Has hx of GERD with Barrett's esophagus, hx of ulcers, followed by Dr. Collier Salina. She is on dexilant 60 mg daily.   BMI is Body mass index is 23.34 kg/m., she has been working on diet and exercise, walking, helps with mood, 4-6 miles daily.   Has reduced alcohol intake from 8/week to 2-3 per week.  Wt Readings from Last 3 Encounters:  03/29/21 136 lb (61.7 kg)  03/13/21 128 lb 15.5 oz (58.5 kg)  05/08/20 129 lb (58.5 kg)   Her blood pressure has been controlled at home, today their BP is BP: 120/80 She does workout, walks daily. She denies chest pain, shortness of breath, dizziness.   She had CTA in 2017 which showed normal caliber arteries and no atherosclerosis  She is not on cholesterol medication and denies myalgias. Her cholesterol is not at goal. She just had lipid check at work 03/09/2021 with LDL of 115. The cholesterol last visit was:   Lab Results  Component Value Date   CHOL 232 (H) 11/26/2019   HDL 89 11/26/2019   LDLCALC 127 (H) 11/26/2019   TRIG 69 11/26/2019   CHOLHDL 2.6 11/26/2019   Last A1C  in the office was:  Lab Results  Component Value Date   HGBA1C 5.0 11/26/2019   Lab Results  Component Value Date   GFRNONAA 99 12/14/2019   Patient is not on Vitamin D supplement, will restart  Lab Results  Component Value Date   VD25OH 24 (L) 11/26/2019     Admits hasn't started supplement Lab Results  Component Value Date   VITAMINB12 340 11/26/2019     Current Medications:  Current Outpatient  Medications on File Prior to Visit  Medication Sig Dispense Refill   Adapalene-Benzoyl Peroxide (EPIDUO) 0.1-2.5 % gel APPLY A SMALL AMOUNT AT BEDTIME AS NEEDED 45 g 3   ALPRAZolam (XANAX) 0.5 MG tablet Take 1/2 to 1 tablet 2 to 3 x / day ONLY if needed for Anxiety Attack or Sleep  & please try to limit to 5 days / week to avoid addiction 60 tablet 0   buPROPion (WELLBUTRIN XL) 150 MG 24 hr tablet Take  1 tablet  Daily for Mood, Focus & Concentration 90 tablet 3   dexlansoprazole (DEXILANT) 60 MG capsule Take 60 mg by mouth daily.     ibuprofen (ADVIL,MOTRIN) 400 MG tablet Take 1 tablet (400 mg total) by mouth every 6 (six) hours as needed. 30 tablet 0   tretinoin (RETIN-A) 0.1 % cream Apply topically at bedtime. 45 g 0   No current facility-administered medications on file prior to visit.   Allergies:  Allergies  Allergen Reactions   Brintellix [Vortioxetine] Nausea Only   Medical History:  Shehas Environmental allergies; Anxiety; Depression, recurrent ([place]); Stress headaches; Hyperlipidemia LDL goal <100; History of dysplastic nevus; Vitamin D deficiency; Gastroesophageal reflux disease with esophagitis; Barrett's esophagus; DDD (degenerative disc disease), cervical; Right elbow pain; Elevated BP without diagnosis of hypertension; Osteopenia; B12 deficiency; and Attention deficit disorder (ADD) without hyperactivity on their problem list.   Surgical History:  She  has a past surgical history that includes Abdominal hysterectomy (2004); Cesarean section; Bladder suspension; and Tonsillectomy. Family History:  She had a family history includes Asthma in her paternal grandmother; COPD in her maternal grandmother; Heart disease in her father and paternal grandfather; Hyperlipidemia in her father; Hypertension in her father and mother; Pulmonary embolism in her maternal grandmother. Social History:  She  reports that she has never smoked. She has been exposed to tobacco smoke. She has never used  smokeless tobacco. She reports current alcohol use of about 3.0 standard drinks per week. She reports that she does not use drugs.  Review of Systems: Review of Systems  Constitutional: Negative.  Negative for malaise/fatigue and weight loss.  HENT: Negative.  Negative for hearing loss and tinnitus.   Eyes: Negative.  Negative for blurred vision and double vision.  Respiratory: Negative.  Negative for cough, shortness of breath and wheezing.   Cardiovascular: Negative.  Negative for chest pain, palpitations, orthopnea, claudication and leg swelling.  Gastrointestinal:  Positive for heartburn (intermittent, after trigger foods, takes tums). Negative for abdominal pain, blood in stool, constipation, diarrhea, melena, nausea and vomiting.  Genitourinary:  Negative for dysuria, flank pain, frequency, hematuria and urgency.       Vaginal itching and discharge  Musculoskeletal:  Negative for joint pain, myalgias and neck pain.  Skin: Negative.  Negative for rash.  Neurological:  Positive for headaches (frequent, worse with stress). Negative for dizziness, tingling, sensory change and weakness.  Endo/Heme/Allergies: Negative.  Negative for polydipsia.  Psychiatric/Behavioral:  Negative for depression, hallucinations, memory loss, substance abuse and suicidal ideas. The patient  is not nervous/anxious and does not have insomnia.   All other systems reviewed and are negative.  Physical Exam: Estimated body mass index is 23.34 kg/m as calculated from the following:   Height as of 03/13/21: 5\' 4"  (1.626 m).   Weight as of this encounter: 136 lb (61.7 kg). BP 120/80    Pulse 93    Temp (!) 97.5 F (36.4 C)    Wt 136 lb (61.7 kg)    SpO2 94%    BMI 23.34 kg/m  General Appearance: Well nourished, in no apparent distress.  Eyes: PERRLA, EOMs, conjunctiva no swelling or erythema  Sinuses: No Frontal/maxillary tenderness  ENT/Mouth: Ext aud canals clear, normal light reflex with TMs without erythema,  bulging. Good dentition. No erythema, swelling, or exudate on post pharynx. Tonsils not swollen or erythematous. Hearing normal. + TMJ Neck: Supple, thyroid normal. No bruits  Respiratory: Respiratory effort normal, BS equal bilaterally without rales, rhonchi, wheezing or stridor.  Cardio: RRR without murmurs, rubs or gallops. Brisk peripheral pulses without edema.  Abdomen: Soft, nontender, no guarding, rebound, hernias, masses, or organomegaly.  Lymphatics: Non tender without lymphadenopathy.  Musculoskeletal: Full ROM all peripheral extremities,5/5 strength, and normal gait., normal distal neuro Skin: Warm, dry without rashes, lesions, ecchymosis. Neuro: Cranial nerves intact, reflexes equal bilaterally. Normal muscle tone, no cerebellar symptoms. Sensation intact.  Psych: Awake and oriented X 3, normal affect, Insight and Judgment appropriate.    Gorden Harms Dakwan Pridgen 11:36 AM [facility]

## 2021-03-31 ENCOUNTER — Other Ambulatory Visit: Payer: Self-pay | Admitting: Adult Health

## 2021-04-02 ENCOUNTER — Other Ambulatory Visit: Payer: Self-pay | Admitting: Nurse Practitioner

## 2021-04-02 ENCOUNTER — Encounter: Payer: Self-pay | Admitting: Adult Health

## 2021-04-02 DIAGNOSIS — F339 Major depressive disorder, recurrent, unspecified: Secondary | ICD-10-CM

## 2021-04-02 MED ORDER — AMPHETAMINE-DEXTROAMPHETAMINE 20 MG PO TABS: ORAL_TABLET | ORAL | 0 refills | Status: DC

## 2021-04-13 ENCOUNTER — Encounter (HOSPITAL_COMMUNITY): Payer: Self-pay | Admitting: Emergency Medicine

## 2021-04-13 ENCOUNTER — Emergency Department (HOSPITAL_COMMUNITY): Payer: BC Managed Care – PPO

## 2021-04-13 ENCOUNTER — Other Ambulatory Visit: Payer: Self-pay

## 2021-04-13 ENCOUNTER — Emergency Department (HOSPITAL_COMMUNITY)
Admission: EM | Admit: 2021-04-13 | Discharge: 2021-04-13 | Disposition: A | Discharge status: Home / Self Care | Payer: BC Managed Care – PPO | Attending: Emergency Medicine | Admitting: Emergency Medicine

## 2021-04-13 DIAGNOSIS — R079 Chest pain, unspecified: Secondary | ICD-10-CM | POA: Diagnosis not present

## 2021-04-13 DIAGNOSIS — R000 Tachycardia, unspecified: Secondary | ICD-10-CM | POA: Diagnosis not present

## 2021-04-13 DIAGNOSIS — R072 Precordial pain: Secondary | ICD-10-CM | POA: Diagnosis not present

## 2021-04-13 DIAGNOSIS — J3489 Other specified disorders of nose and nasal sinuses: Secondary | ICD-10-CM | POA: Insufficient documentation

## 2021-04-13 DIAGNOSIS — R0789 Other chest pain: Secondary | ICD-10-CM | POA: Diagnosis not present

## 2021-04-13 LAB — CBC
HCT: 39.2 % (ref 36.0–46.0)
Hemoglobin: 13.7 g/dL (ref 12.0–15.0)
MCH: 33.3 pg (ref 26.0–34.0)
MCHC: 34.9 g/dL (ref 30.0–36.0)
MCV: 95.1 fL (ref 80.0–100.0)
Platelets: 256 10*3/uL (ref 150–400)
RBC: 4.12 MIL/uL (ref 3.87–5.11)
RDW: 12 % (ref 11.5–15.5)
WBC: 5.3 10*3/uL (ref 4.0–10.5)
nRBC: 0 % (ref 0.0–0.2)

## 2021-04-13 LAB — BASIC METABOLIC PANEL
Anion gap: 11 (ref 5–15)
BUN: 10 mg/dL (ref 6–20)
CO2: 23 mmol/L (ref 22–32)
Calcium: 9.6 mg/dL (ref 8.9–10.3)
Chloride: 102 mmol/L (ref 98–111)
Creatinine, Ser: 0.78 mg/dL (ref 0.44–1.00)
GFR, Estimated: >60 mL/min (ref >60)
Glucose, Bld: 109 mg/dL — ABNORMAL HIGH (ref 70–99)
Potassium: 3.7 mmol/L (ref 3.5–5.1)
Sodium: 136 mmol/L (ref 135–145)

## 2021-04-13 LAB — TROPONIN I (HIGH SENSITIVITY)
Troponin I (High Sensitivity): <2 ng/L (ref <18)
Troponin I (High Sensitivity): <2 ng/L (ref <18)

## 2021-04-13 LAB — D-DIMER, QUANTITATIVE: D-Dimer, Quant: <0.27 ug/mL-FEU (ref 0.00–0.50)

## 2021-04-13 NOTE — ED Triage Notes (Signed)
Patient complains of burning central chest pain and left arm numbness that woke her this morning at 0600, patient reports being awaken yesterday morning by same pain that resolved after a few hours. Patient alert, oriented, speaking in complete sentences. Patient reports intermittent dizziness.

## 2021-04-13 NOTE — ED Provider Notes (Signed)
[facility] EMERGENCY DEPARTMENT Provider Note   CSN: [id number] Arrival date & time: 04/13/21  6301     History  Chief Complaint  Patient presents with   Chest Pain    Haley Sparks is a 61 y.o. female.  Patient with history of anxiety, GERD, hiatal hernia -- presents to the emergency department for evaluation of chest discomfort.  Patient states that she awoke from sleep around 6 AM today with a complaint of burning in her chest with radiation to her right arm.  No significant shortness of breath.  She did not have vomiting, diaphoresis.  Symptoms are nonexertional.  Improved now, but still mild chest discomfort.  States similar pain upon waking yesterday morning.  She had a sinus infection around Christmas but no recent cough or fevers.  No abdominal pain or urinary symptoms.  No treatments prior to arrival.  She denies history of hypertension, high cholesterol, diabetes, smoking.  Father with reported history of bypass surgery. Patient denies risk factors for pulmonary embolism including: unilateral leg swelling, history of DVT/PE/other blood clots, use of exogenous hormones, recent immobilizations, recent surgery, recent travel (>4hr segment), malignancy, hemoptysis.  Patient states that she is fairly active, and performs activities such as cutting wood.  She denies any development of chest pain recently with these activities.   GERD, hiatal hernia     Home Medications Prior to Admission medications   Medication Sig Start Date End Date Taking? Authorizing Provider  Adapalene-Benzoyl Peroxide (EPIDUO) 0.1-2.5 % gel APPLY A SMALL AMOUNT AT BEDTIME AS NEEDED 11/08/20   Magda Bernheim, NP  ALPRAZolam Duanne Moron) 0.5 MG tablet Take 1/2 to 1 tablet 2 to 3 x / day ONLY if needed for Anxiety Attack or Sleep  & please try to limit to 5 days / week to avoid addiction 01/15/21   Magda Bernheim, NP  amphetamine-dextroamphetamine (ADDERALL) 20 MG tablet Take 1/2-1 tablet 2 x /day as  needed for Focus & Concentration 04/02/21   Magda Bernheim, NP  buPROPion (WELLBUTRIN XL) 150 MG 24 hr tablet Take  1 tablet  Daily for Mood, Focus & Concentration 09/14/20   Unk Pinto, MD  butalbital-acetaminophen-caffeine (FIORICET) 50-325-40 MG tablet Take 1 tablet by mouth every 6 (six) hours as needed for headache. Limit to 2 doses/week. 03/29/21   [name], NP  dexlansoprazole (DEXILANT) 60 MG capsule Take 60 mg by mouth daily.    [provider]  estradiol (ESTRACE) 2 MG tablet TAKE 1 TABLET(2 MG) BY MOUTH DAILY Patient taking differently: Take 2 mg by mouth daily. 03/31/21   [name], NP  ibuprofen (ADVIL,MOTRIN) 400 MG tablet Take 1 tablet (400 mg total) by mouth every 6 (six) hours as needed. 05/03/18   Varney Biles, MD  tretinoin (RETIN-A) 0.1 % cream Apply topically at bedtime. 06/01/20   [name], NP      Allergies    Brintellix [vortioxetine]    Review of Systems   Review of Systems  Physical Exam Updated Vital Signs BP (!) 119/95 (BP Location: Right Arm)    Pulse 94    Temp 98.1 F (36.7 C) (Oral)    Resp 20    SpO2 100%  Physical Exam Vitals and nursing note reviewed.  Constitutional:      General: She is not in acute distress.    Appearance: She is well-developed.  HENT:     Head: Normocephalic and atraumatic.     Right Ear: External ear normal.  Left Ear: External ear normal.     Nose: Nose normal.  Eyes:     Conjunctiva/sclera: Conjunctivae normal.  Cardiovascular:     Rate and Rhythm: Regular rhythm. Tachycardia present.     Heart sounds: No murmur heard. Pulmonary:     Effort: No tachypnea or respiratory distress.     Breath sounds: No wheezing, rhonchi or rales.  Abdominal:     Palpations: Abdomen is soft.     Tenderness: There is no abdominal tenderness. There is no guarding or rebound.  Musculoskeletal:     Cervical back: Normal range of motion and neck supple.     Right lower leg: No edema.     Left lower leg: No  edema.  Skin:    General: Skin is warm and dry.     Findings: No rash.  Neurological:     General: No focal deficit present.     Mental Status: She is alert. Mental status is at baseline.     Motor: No weakness.  Psychiatric:        Mood and Affect: Mood normal.    ED Results / Procedures / Treatments   Labs (all labs ordered are listed, but only abnormal results are displayed) Labs Reviewed  BASIC METABOLIC PANEL - Abnormal; Notable for the following components:      Result Value   Glucose, Bld 109 (*)    All other components within normal limits  CBC  D-DIMER, QUANTITATIVE  TROPONIN I (HIGH SENSITIVITY)  TROPONIN I (HIGH SENSITIVITY)    ED ECG REPORT   Date: 04/13/2021  Rate: 118  Rhythm: sinus tachycardia  QRS Axis: right  Intervals: normal  ST/T Wave abnormalities: nonspecific ST changes  Conduction Disutrbances:none  Narrative Interpretation:   Old EKG Reviewed: changes noted from 11/26/19, RAD at that time, no ST/T changes, slower  I have personally reviewed the EKG tracing and agree with the computerized printout as noted.   Radiology DG Chest 2 View  Result Date: 04/13/2021 CLINICAL DATA:  Chest pain EXAM: CHEST - 2 VIEW COMPARISON:  Chest radiograph 06/09/2019 FINDINGS: The cardiomediastinal silhouette is normal. There is no focal consolidation or pulmonary edema. There is no pleural effusion or pneumothorax. There is no acute osseous abnormality. IMPRESSION: No radiographic evidence of acute cardiopulmonary process. Electronically Signed   By: Valetta Mole M.D.   On: 04/13/2021 08:50    Procedures Procedures    Medications Ordered in ED Medications - No data to display  ED Course/ Medical Decision Making/ A&P   Patient seen and examined. History obtained directly from patient. Work-up including labs, imaging, EKG ordered in triage, if performed, were reviewed.    Labs/EKG: Independently reviewed and interpreted.  This included: EKG as interpreted  above, first troponin less than 2, CBC and BMP unremarkable.  Awaiting second troponin and added D-dimer due to tachycardia and appearance of EKG on arrival.  Imaging: Independently reviewed and interpreted.  This included: Chest x-ray, mildly hyperinflated, but otherwise normal.  Medications/Fluids: None ordered.  Most recent vital signs reviewed and are as follows: BP (!) 119/95 (BP Location: Right Arm)    Pulse 94    Temp 98.1 F (36.7 C) (Oral)    Resp 20    SpO2 100%   Initial impression: Nonspecific chest pain  12:54 PM Reassessment performed. Patient appears comfortable. She was just up to restroom without problems.   Labs and imaging personally reviewed and interpreted including: negative second trop and neg d-dimer, repeat EKG.  ED  ECG REPORT   Date: 04/13/2021  Rate: 97  Rhythm: normal sinus rhythm  QRS Axis: right  Intervals: normal  ST/T Wave abnormalities: normal  Conduction Disutrbances:none  Narrative Interpretation:   Old EKG Reviewed: changes noted  I have personally reviewed the EKG tracing and agree with the computerized printout as noted.   Reviewed additional pertinent lab work and imaging with patient at bedside including: Reassuring troponin, EKG, D-dimer, chest x-ray.  Most current vital signs reviewed and are as follows: BP (!) 142/88    Pulse 98    Temp 98.1 F (36.7 C) (Oral)    Resp 12    Ht 5\' 3"  (1.6 m)    Wt 59.4 kg    SpO2 100%    BMI 23.21 kg/m   Plan: Discharge home  Home treatment: No prescriptions.  Offered PPI as patient states that she has been off of her medication for GERD/hiatal hernia, however she states he is going to call her GI doctor and schedule a follow-up appointment.  She also notes that she is due for colonoscopy.  Return and follow-up instructions: I encouraged patient to return to ED with severe chest pain, especially if the pain is crushing or pressure-like and spreads to the arms, back, neck, or jaw, or if they have  associated sweating, vomiting, or shortness of breath with the pain, or significant pain with activity. We discussed that the evaluation here today indicates a low-risk of serious cause of chest pain, including heart trouble or a blood clot, but no evaluation is perfect and chest pain can evolve with time. The patient verbalized understanding and agreed. I encouraged patient to follow-up with their provider in the next 48 hours for recheck.                           Medical Decision Making Amount and/or Complexity of Data Reviewed Labs: ordered. Radiology: ordered.   For this patient's complaint of chest pain, the following emergent conditions were considered on the differential diagnosis: acute coronary syndrome, pulmonary embolism, pneumothorax, myocarditis, pericardial tamponade, aortic dissection, thoracic aortic aneurysm complication, esophageal perforation.   Other causes were also considered including: gastroesophageal reflux disease, musculoskeletal pain including costochondritis, pneumonia/pleurisy, herpes zoster, pericarditis.  In regards to possibility of ACS, patient has atypical features of pain, non-ischemic EKG after rate improved, and negative troponin x 2. Heart score was calculated to be 2.   Her symptoms are somewhat consistent with GI etiology such as reflux and patient states that she has been off her Dexilant due to cost issues.  In regards to possibility of PE, symptoms are atypical and d-dimer is negative, making PE low likelihood.   The patient's vital signs, pertinent lab work and imaging were reviewed and interpreted as discussed in the ED course. Hospitalization was considered for further testing, treatments, or serial exams/observation. However as patient is well-appearing, has a stable exam, and reassuring studies today, I do not feel that they warrant admission at this time. This plan was discussed with the patient who verbalizes agreement and comfort with this plan  and seems reliable and able to return to the Emergency Department with worsening or changing symptoms.          Final Clinical Impression(s) / ED Diagnoses Final diagnoses:  Precordial pain    Rx / DC Orders ED Discharge Orders     None         Carlisle Cater, PA-C 04/13/21 1307  Dorie Rank, MD 04/14/21 [phone]

## 2021-04-13 NOTE — Discharge Instructions (Signed)
Please read and follow all provided instructions.  Your diagnoses today include:  1. Precordial pain     Tests performed today include: An EKG of your heart: Initial EKG was fast, however second EKG appears normal and very similar to previous A chest x-ray: No sign of problems with the lungs such as pneumonia Cardiac enzymes - a blood test for heart muscle damage, were both negative without signs of stress on the heart D-dimer -screening test for blood clot was negative Blood counts and electrolytes: Were normal Vital signs. See below for your results today.   Medications prescribed:  None  Take any prescribed medications only as directed.  Follow-up instructions: Please follow-up with your primary care/GI providers as soon as you can for further evaluation of your symptoms.   Return instructions:  SEEK IMMEDIATE MEDICAL ATTENTION IF: You have severe chest pain, especially if the pain is crushing or pressure-like and spreads to the arms, back, neck, or jaw, or if you have sweating, nausea or vomiting, or trouble with breathing. THIS IS AN EMERGENCY. Do not wait to see if the pain will go away. Get medical help at once. Call 911. DO NOT drive yourself to the hospital.  Your chest pain gets worse and does not go away after a few minutes of rest.  You have an attack of chest pain lasting longer than what you usually experience.  You have significant dizziness, if you pass out, or have trouble walking.  You have chest pain not typical of your usual pain for which you originally saw your caregiver.  You have any other emergent concerns regarding your health.  Additional Information: Chest pain comes from many different causes. Your caregiver has diagnosed you as having chest pain that is not specific for one problem, but does not require admission.  You are at low risk for an acute heart condition or other serious illness.   Your vital signs today were: BP (!) 142/88    Pulse 98    Temp  98.1 F (36.7 C) (Oral)    Resp 12    Ht 5\' 3"  (1.6 m)    Wt 59.4 kg    SpO2 100%    BMI 23.21 kg/m  If your blood pressure (BP) was elevated above 135/85 this visit, please have this repeated by your doctor within one month. --------------

## 2021-05-15 ENCOUNTER — Other Ambulatory Visit: Payer: Self-pay | Admitting: Adult Health

## 2021-05-15 DIAGNOSIS — Z1231 Encounter for screening mammogram for malignant neoplasm of breast: Secondary | ICD-10-CM

## 2021-05-17 ENCOUNTER — Ambulatory Visit
Admission: RE | Admit: 2021-05-17 | Discharge: 2021-05-17 | Disposition: A | Discharge status: Home / Self Care | Payer: BC Managed Care – PPO | Source: Ambulatory Visit | Attending: Adult Health | Admitting: Adult Health

## 2021-05-17 DIAGNOSIS — Z1231 Encounter for screening mammogram for malignant neoplasm of breast: Secondary | ICD-10-CM | POA: Diagnosis not present

## 2021-05-18 ENCOUNTER — Other Ambulatory Visit: Payer: Self-pay | Admitting: Nurse Practitioner

## 2021-05-18 DIAGNOSIS — F419 Anxiety disorder, unspecified: Secondary | ICD-10-CM

## 2021-05-18 MED ORDER — ALPRAZOLAM 0.5 MG PO TABS: ORAL_TABLET | ORAL | 0 refills | Status: DC

## 2021-07-20 ENCOUNTER — Encounter: Payer: Self-pay | Admitting: Nurse Practitioner

## 2021-07-20 ENCOUNTER — Ambulatory Visit: Payer: BC Managed Care – PPO | Admitting: Nurse Practitioner

## 2021-07-20 VITALS — BP 110/70 | HR 95 | Temp 97.9°F | Resp 17 | Ht 63.0 in | Wt 135.2 lb

## 2021-07-20 DIAGNOSIS — E559 Vitamin D deficiency, unspecified: Secondary | ICD-10-CM

## 2021-07-20 DIAGNOSIS — R42 Dizziness and giddiness: Secondary | ICD-10-CM | POA: Diagnosis not present

## 2021-07-20 MED ORDER — MECLIZINE HCL 25 MG PO TABS: 25.0000 mg | ORAL_TABLET | Freq: Three times a day (TID) | ORAL | 1 refills | Status: DC | PRN

## 2021-07-20 NOTE — Progress Notes (Signed)
Assessment and Plan: ? ?Haley Sparks was seen today for an episodic visit. ? ?Diagnoses and all order for this visit: ? ?1. Dizziness ?Continue Antivert. ?Stay well hydrated. ?Suggest updated eye exam. ?Likely vertigo if no improvement or recurrent. ? ?- CBC with Differential/Platelet ?- COMPLETE METABOLIC PANEL WITH GFR ? ?2. Vitamin D deficiency ?Last check was below normal. ? ?- VITAMIN D 25 Hydroxy (Vit-D Deficiency, Fractures) ? ? ?Continue to monitor for any increase in uncontrolled symptoms.  ? ?Notify office for further evaluation and treatment, questions or concerns if s/s fail to improve. The risks and benefits of my recommendations, as well as other treatment options were discussed with the patient today. Questions were answered. ? ?Further disposition pending results of labs. Discussed med's effects and SE's.   ? ?Over 15 minutes of exam, counseling, chart review, and critical decision making was performed.  ? ?Future Appointments  ?Date Time Provider [place]  ?08/29/2021  9:00 AM [name], NP [facility] None  ? ? ?------------------------------------------------------------------------------------------------------------------ ? ? ?HPI ?BP 110/70 Comment: 106/78 standing, 118/80 laying  Pulse 95   Temp 97.9 ?F (36.6 ?C)   Resp 17   Ht '5\' 3"'$  (1.6 m)   Wt 135 lb 3.2 oz (61.3 kg)   SpO2 97%   BMI 23.95 kg/m?  ? ?61 y.o.female presents for reports of dizziness.  Has occurred over the last week.  She tries to stay well hydrated, she has not had a recent URI.  She is not UTD on last eye exam. Denies any seasonal allergy triggers.  Has not had a change in altitude.  No hx of vertigo.  She has had dizziness occur in the past and has taken Antivert with improvement.  She reports that she has not taken the medication consistently enough to help over the last week.  Denies any heart palpitations or increase in bleeding.  Hx of hysterectomy.  Denies any recent head or neck injury.    ? ?Past Medical History:  ?Diagnosis Date  ? Allergy   ? Anxiety   ? Barrett's esophagus   ? Depression   ? GERD (gastroesophageal reflux disease)   ? History of bladder surgery   ? Liver cyst 06/07/2015  ? Benign appearing liver cysts noted incidentally on multiple imaging scans. No further workup recommended.   ? Miscarriage   ? twice  ? Peptic ulcer disease   ? Renal disorder   ? kidney stones  ?  ? ?Allergies  ?Allergen Reactions  ? Brintellix [Vortioxetine] Nausea Only  ? ? ?Current Outpatient Medications on File Prior to Visit  ?Medication Sig  ? Adapalene-Benzoyl Peroxide (EPIDUO) 0.1-2.5 % gel APPLY A SMALL AMOUNT AT BEDTIME AS NEEDED  ? ALPRAZolam (XANAX) 0.5 MG tablet Take 1/2 to 1 tablet 2 to 3 x / day ONLY if needed for Anxiety Attack or Sleep  & please try to limit to 5 days / week to avoid addiction  ? amphetamine-dextroamphetamine (ADDERALL) 20 MG tablet Take 1/2-1 tablet 2 x /day as needed for Focus & Concentration  ? buPROPion (WELLBUTRIN XL) 150 MG 24 hr tablet Take  1 tablet  Daily for Mood, Focus & Concentration  ? butalbital-acetaminophen-caffeine (FIORICET) 50-325-40 MG tablet Take 1 tablet by mouth every 6 (six) hours as needed for headache. Limit to 2 doses/week.  ? dexlansoprazole (DEXILANT) 60 MG capsule Take 60 mg by mouth daily.  ? estradiol (ESTRACE) 2 MG tablet TAKE 1 TABLET(2 MG) BY MOUTH DAILY (Patient taking differently: Take 2 mg  by mouth daily.)  ? ibuprofen (ADVIL,MOTRIN) 400 MG tablet Take 1 tablet (400 mg total) by mouth every 6 (six) hours as needed.  ? tretinoin (RETIN-A) 0.1 % cream Apply topically at bedtime.  ? ?No current facility-administered medications on file prior to visit.  ? ? ?ROS: all negative except what is noted in the HPI.  ? ?Physical Exam: ? ?BP 110/70 Comment: 106/78 standing, 118/80 laying  Pulse 95   Temp 97.9 ?F (36.6 ?C)   Resp 17   Ht '5\' 3"'$  (1.6 m)   Wt 135 lb 3.2 oz (61.3 kg)   SpO2 97%   BMI 23.95 kg/m?  ? ?General Appearance: NAD.  Awake,  conversant and cooperative. ?Eyes: PERRLA, EOMs intact.  Sclera white.  Conjunctiva without erythema. ?Sinuses: No frontal/maxillary tenderness.  No nasal discharge. Nares patent.  ?ENT/Mouth: Ext aud canals clear.  Bilateral TMs w/DOL and without erythema or bulging. Hearing intact.  Posterior pharynx without swelling or exudate.  Tonsils without swelling or erythema.  ?Neck: Supple.  No masses, nodules or thyromegaly. ?Respiratory: Effort is regular with non-labored breathing. Breath sounds are equal bilaterally without rales, rhonchi, wheezing or stridor.  ?Cardio: RRR with no MRGs. Brisk peripheral pulses without edema.  ?Abdomen: Active BS in all four quadrants.  Soft and non-tender without guarding, rebound tenderness, hernias or masses. ?Lymphatics: Non tender without lymphadenopathy.  ?Musculoskeletal: Full ROM, 5/5 strength, normal ambulation.  No clubbing or cyanosis. ?Skin: Appropriate color for ethnicity. Warm without rashes, lesions, ecchymosis, ulcers.  ?Neuro: CN II-XII grossly normal. Normal muscle tone without cerebellar symptoms and intact sensation.   ?Psych: AO X 3,  appropriate mood and affect, insight and judgment.  ?  ? ?Darrol Jump, NP ?10:43 AM ?[facility] ? ?

## 2021-07-21 LAB — CBC WITH DIFFERENTIAL/PLATELET
Absolute Monocytes: 597 cells/uL (ref 200–950)
Basophils Absolute: 38 cells/uL (ref 0–200)
Basophils Relative: 0.8 %
Eosinophils Absolute: 150 cells/uL (ref 15–500)
Eosinophils Relative: 3.2 %
HCT: 36.5 % (ref 35.0–45.0)
Hemoglobin: 12.4 g/dL (ref 11.7–15.5)
Lymphs Abs: 1913 cells/uL (ref 850–3900)
MCH: 34 pg — ABNORMAL HIGH (ref 27.0–33.0)
MCHC: 34 g/dL (ref 32.0–36.0)
MCV: 100 fL (ref 80.0–100.0)
MPV: 9.8 fL (ref 7.5–12.5)
Monocytes Relative: 12.7 %
Neutro Abs: 2002 cells/uL (ref 1500–7800)
Neutrophils Relative %: 42.6 %
Platelets: 253 10*3/uL (ref 140–400)
RBC: 3.65 10*6/uL — ABNORMAL LOW (ref 3.80–5.10)
RDW: 12 % (ref 11.0–15.0)
Total Lymphocyte: 40.7 %
WBC: 4.7 10*3/uL (ref 3.8–10.8)

## 2021-07-21 LAB — COMPLETE METABOLIC PANEL WITHOUT GFR
AG Ratio: 1.6 (calc) (ref 1.0–2.5)
ALT: 8 U/L (ref 6–29)
AST: 12 U/L (ref 10–35)
Albumin: 4.1 g/dL (ref 3.6–5.1)
Alkaline phosphatase (APISO): 58 U/L (ref 37–153)
BUN: 10 mg/dL (ref 7–25)
CO2: 30 mmol/L (ref 20–32)
Calcium: 9.1 mg/dL (ref 8.6–10.4)
Chloride: 105 mmol/L (ref 98–110)
Creat: 0.62 mg/dL (ref 0.50–1.03)
Globulin: 2.5 g/dL (ref 1.9–3.7)
Glucose, Bld: 57 mg/dL — ABNORMAL LOW (ref 65–99)
Potassium: 4.7 mmol/L (ref 3.5–5.3)
Sodium: 142 mmol/L (ref 135–146)
Total Bilirubin: 0.4 mg/dL (ref 0.2–1.2)
Total Protein: 6.6 g/dL (ref 6.1–8.1)
eGFR: 103 mL/min/{1.73_m2}

## 2021-07-21 LAB — VITAMIN D 25 HYDROXY (VIT D DEFICIENCY, FRACTURES): Vit D, 25-Hydroxy: 34 ng/mL (ref 30–100)

## 2021-08-12 DIAGNOSIS — J018 Other acute sinusitis: Secondary | ICD-10-CM | POA: Diagnosis not present

## 2021-08-12 DIAGNOSIS — R059 Cough, unspecified: Secondary | ICD-10-CM | POA: Diagnosis not present

## 2021-08-26 DIAGNOSIS — M79675 Pain in left toe(s): Secondary | ICD-10-CM | POA: Diagnosis not present

## 2021-08-26 DIAGNOSIS — M7989 Other specified soft tissue disorders: Secondary | ICD-10-CM | POA: Diagnosis not present

## 2021-08-26 DIAGNOSIS — M79672 Pain in left foot: Secondary | ICD-10-CM | POA: Diagnosis not present

## 2021-08-28 NOTE — Progress Notes (Unsigned)
Complete Physical  Assessment and Plan:  Encounter for Annual Physical Exam with abnormal findings Due annually  Health Maintenance reviewed Healthy lifestyle reviewed and goals set - schedule DEXA at breast center - patient to schedule follow up with GYN,   Elevated BP without dx of htn Intermittently elevated on review; borderline, patient wants to work on lifestyle Monitor blood pressure at home; call if consistently over 130/80 Continue DASH diet.   Reminder to go to the ER if any CP, SOB, nausea, dizziness, severe HA, changes vision/speech, left arm numbness and tingling and jaw pain.   Hyperlipidemia LDL goal <100 - check lipids, decrease fatty foods, increase activity, wants to avoid medications  - low risk, had normal vessels on CTA 2017 - CBC with Differential/Platelet - CMP/GFR - Lipid panel   Vitamin D deficiency - VITAMIN D 25 Hydroxy (Vit-D Deficiency, Fractures)  Medication management - Magnesium  Barrett's esophagus with dysplasia -controlled, continue the same medications, follow up Dr. Collier Salina - has scheduled  NEEDS TO STOP GOODY POWDERS, NSAIDS  Gastroesophageal reflux disease with esophagitis Continue PPI/H2 blocker, diet discussed, follow up Dr. Collier Salina - has scheduled  History of dysplastic nevus Protect skin from UV exposure; monitor closely   Continue fu derm   Depression, recurrent ([place]) In remission on wellbutrin 150 mg; continue  Lifestyle discussed: diet/exerise, sleep hygiene, stress management, hydration - TSH - amphetamine-dextroamphetamine (ADDERALL) 20 MG tablet; Take 0.5 tablets (10 mg total) by mouth 2 (two) times daily with a meal.  Dispense: 60 tablet; Refill: 0 - Wellbutrin 150 mg ER once daily in AM  Stress headaches  Will treat mood, stress management discussed discussed PT, massage, heat application - identify triggers and address at the source   Insomnia - good sleep hygiene discussed, increase day time activity, try melatonin  or benadryl, continue xanax as uses occasional only   Allergy, subsequent encounter - Allegra OTC, increase H20, allergy hygiene explained.  Screening for blood or protein in urine - Urinalysis, Routine w reflex microscopic (not at [facility]) - Microalbumin / creatinine urine ratio  Hip pain/bursitis Discussed icing, stretches, can do topical NSAID Reviewed etiology, given stretches/exercises to try  Follow up in office if not resolving, plan XR then PT referral   BMI 22 Weight trending up from baseline, WNL but patient very frustrated Reports good lifestyle choices Check TSH, B12 Reduce stress, prioritize sleep Try keeping food log, protein/clean eating reviewed continue regular exercise Follow up with food log if weight continues to trend up without good explanation  Orders Placed This Encounter  Procedures   DG Bone Density   CBC with Differential/Platelet   COMPLETE METABOLIC PANEL WITH GFR   Magnesium   Lipid panel   TSH   Hemoglobin A1c   VITAMIN D 25 Hydroxy (Vit-D Deficiency, Fractures)   Vitamin B12   Microalbumin / creatinine urine ratio   Urinalysis, Routine w reflex microscopic    Discussed med's effects and SE's. Screening labs and tests as requested with regular follow-up as recommended. Over 40 minutes of exam, counseling, chart review, and complex, high level critical decision making was performed this visit.   Future Appointments  Date Time Provider [place]  02/28/2022  9:30 AM Darrol Jump, NP [facility] None  08/30/2022  9:00 AM Alycia Rossetti, NP [facility] None     HPI  61 y.o. female  presents for a complete physical. She has Environmental allergies; Anxiety; Depression, recurrent ([place]); Stress headaches; Hyperlipidemia LDL goal <100; History of dysplastic nevus; Vitamin  D deficiency; Gastroesophageal reflux disease with esophagitis; Barrett's esophagus; DDD (degenerative disc disease), cervical; Right elbow pain; Elevated BP without  diagnosis of hypertension; Osteopenia; B12 deficiency; and Attention deficit disorder (ADD) without hyperactivity on their problem list.   She is married, 2 daughters, 2 grandchildren and another on the way.  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 Lots of stress recently, daughter having family issues, worries about her.   She is followed by GYN Dr. Corinna Capra, but last in 2018; She has had TAH 1 ovary spared, and has osteopenia, on estradiol 2 mg  for osteopenia as well as hot flashes, on bASA.  Mammo 05/17/2021 negative at breast center. She is agreeable to GYN follow up this year.   Hx of recurrent depression, recently has remained on wellbutin XR 150 mg with good results. She has hx of ADD, prescribed adderall 20 mg IR, report takes 1/2 tab adderall once daily, rarely second tab in hour if running late, takes Mon-Fri without SE.   Has xanax but rarely uses during the day, typically takes to sleep at night, ongoing discussions to limit use. Has tried gabapentin before and had too much fogginess.   She has headaches associated with stress/neck pain; uses Excedrin migraine; admits occasional goody powder use. Occasionally fioricet.  She has neck pain, arthritis, has seen neuro for shots, hasn't been recently.   Has hx of GERD with Barrett's esophagus, hx of ulcers, followed by Dr. Collier Salina. She is on dexilant 60 mg daily. Has GI follow up tomorrow.   She has history of dysplastic nevus, and follows with Dr. Ubaldo Glassing.   BMI is Body mass index is 22.5 kg/m., she has been working on diet and exercise, walking, going to the gym regularly, helps with mood. Frustrated with recent weight gain, feels like making good choices overall/no big changes.  Has reduced alcohol intake from 8/week to once per week.  Wt Readings from Last 3 Encounters:  08/29/21 139 lb 6.4 oz (63.2 kg)  07/20/21 135 lb 3.2 oz (61.3 kg)  04/13/21 131 lb (59.4 kg)   Her blood pressure has been controlled at home,  today their BP is BP: 138/88 She does workout, walks daily. She denies chest pain, shortness of breath, dizziness.   She had CTA in 2017 which showed normal caliber arteries and no atherosclerosis  She is not on cholesterol medication and denies myalgias. Her cholesterol is not at goal. The cholesterol last visit was:   Lab Results  Component Value Date   CHOL 232 (H) 11/26/2019   HDL 89 11/26/2019   LDLCALC 127 (H) 11/26/2019   TRIG 69 11/26/2019   CHOLHDL 2.6 11/26/2019   Last A1C in the office was:  Lab Results  Component Value Date   HGBA1C 5.0 11/26/2019   Lab Results  Component Value Date   EGFR 103 07/20/2021   Patient is not currently on Vitamin D supplement.  Lab Results  Component Value Date   VD25OH 34 07/20/2021     She is not currently on B12 supplement -  Lab Results  Component Value Date   VITAMINB12 340 11/26/2019     Current Medications:  Current Outpatient Medications on File Prior to Visit  Medication Sig Dispense Refill   Adapalene-Benzoyl Peroxide (EPIDUO) 0.1-2.5 % gel APPLY A SMALL AMOUNT AT BEDTIME AS NEEDED 45 g 3   buPROPion (WELLBUTRIN XL) 150 MG 24 hr tablet Take  1 tablet  Daily for Mood, Focus & Concentration 90  tablet 3   butalbital-acetaminophen-caffeine (FIORICET) 50-325-40 MG tablet Take 1 tablet by mouth every 6 (six) hours as needed for headache. Limit to 2 doses/week. 30 tablet 0   dexlansoprazole (DEXILANT) 60 MG capsule Take 60 mg by mouth daily.     estradiol (ESTRACE) 2 MG tablet TAKE 1 TABLET(2 MG) BY MOUTH DAILY (Patient taking differently: Take 2 mg by mouth daily.) 90 tablet 3   ibuprofen (ADVIL,MOTRIN) 400 MG tablet Take 1 tablet (400 mg total) by mouth every 6 (six) hours as needed. 30 tablet 0   meclizine (ANTIVERT) 25 MG tablet Take 1 tablet (25 mg total) by mouth 3 (three) times daily as needed for dizziness or nausea. 60 tablet 1   tretinoin (RETIN-A) 0.1 % cream Apply topically at bedtime. 45 g 0   No current  facility-administered medications on file prior to visit.   Allergies:  Allergies  Allergen Reactions   Brintellix [Vortioxetine] Nausea Only   Medical History:  Shehas Environmental allergies; Anxiety; Depression, recurrent ([place]); Stress headaches; Hyperlipidemia LDL goal <100; History of dysplastic nevus; Vitamin D deficiency; Gastroesophageal reflux disease with esophagitis; Barrett's esophagus; DDD (degenerative disc disease), cervical; Right elbow pain; Elevated BP without diagnosis of hypertension; Osteopenia; B12 deficiency; and Attention deficit disorder (ADD) without hyperactivity on their problem list.   Health Maintenance:   Immunization History  Administered Date(s) Administered   Influenza Inj Mdck Quad With Preservative 02/24/2020   Influenza Split 12/25/2012, 12/20/2014   Influenza,inj,Quad PF,6+ Mos 02/04/2019   Influenza-Unspecified 12/23/2016   PFIZER(Purple Top)SARS-COV-2 Vaccination 08/03/2019, 08/24/2019   Tdap 09/04/2012   Health Maintenance  Topic Date Due   PAP SMEAR-Modifier  01/06/2017   COVID-19 Vaccine (3 - Pfizer risk series) 09/14/2021 (Originally 09/21/2019)   Zoster Vaccines- Shingrix (1 of 2) 11/29/2021 (Originally 08/27/1979)   INFLUENZA VACCINE  10/23/2021   TETANUS/TDAP  09/05/2022   MAMMOGRAM  05/18/2023   COLONOSCOPY (Pts 45-69yr Insurance coverage will need to be confirmed)  01/02/2025   HPV VACCINES  Aged Out   Hepatitis C Screening  Discontinued   HIV Screening  Discontinued   Shingrix: discussed, wants to hold off Covid 19: 2/2, pfizer, will send card  LMP TAH, cervix removed  Pap: 2018 Dr. LCorinna Capra will schedule this year MGM: 04/2021, breast center  DEXA: 2018 osteopenia Dr. LCorinna Caprabut wants to get next at breast center - order placed  Colonoscopy: Aug 2016 with Dr. PFerdinand Lango polyps, [place], 5 year recall, overdue, has appointment tomorrow  EGD: 03/2018, + gastritis/ulcer still, - on dexilant, 2 year follow up, has follow  up tomorrow   [facility], over[facility] - has next month  Dental: Dr. MRandol Kern last 07/2021, goes [id number]: Dr. LoUbaldo Glassingderm, will schedule with new provider   Patient Care Team: McUnk PintoMD as PCP - General (Internal Medicine) LoDonell BeersMD as Referring Physician (Orthopedic Surgery) DaMelrose NakayamaMD as Consulting Physician (Orthopedic Surgery) PeCoral SpikesMD (Unknown Physician Specialty)  Surgical History:  She  has a past surgical history that includes Abdominal hysterectomy (2004); Cesarean section; Bladder suspension; and Tonsillectomy. Family History:  She had a family history includes Asthma in her paternal grandmother; COPD in her maternal grandmother; Heart disease in her father and paternal grandfather; Hyperlipidemia in her father; Hypertension in her father and mother; Pulmonary embolism in her maternal grandmother. Social History:  She  reports that she has never smoked. She has been exposed to tobacco smoke. She has never used smokeless  tobacco. She reports current alcohol use of about 3.0 standard drinks per week. She reports that she does not use drugs.  Review of Systems: Review of Systems  Constitutional: Negative.  Negative for malaise/fatigue and weight loss.  HENT: Negative.  Negative for hearing loss and tinnitus.   Eyes: Negative.  Negative for blurred vision and double vision.  Respiratory: Negative.  Negative for cough, shortness of breath and wheezing.   Cardiovascular: Negative.  Negative for chest pain, palpitations, orthopnea, claudication and leg swelling.  Gastrointestinal:  Positive for heartburn (intermittent, after trigger foods, takes tums). Negative for abdominal pain, blood in stool, constipation, diarrhea, melena, nausea and vomiting.  Genitourinary: Negative.  Negative for dysuria, flank pain, frequency, hematuria and urgency.  Musculoskeletal:  Positive for neck pain (at night, some intermittent right  radicular). Negative for joint pain and myalgias.  Skin: Negative.  Negative for rash.  Neurological:  Positive for headaches (improved, a few/week). Negative for dizziness, tingling, sensory change and weakness.  Endo/Heme/Allergies: Negative.  Negative for polydipsia.  Psychiatric/Behavioral:  Negative for depression, hallucinations, memory loss, substance abuse and suicidal ideas. The patient is not nervous/anxious and does not have insomnia.   All other systems reviewed and are negative.  Physical Exam: Estimated body mass index is 22.5 kg/m as calculated from the following:   Height as of this encounter: '5\' 6"'  (1.676 m).   Weight as of this encounter: 139 lb 6.4 oz (63.2 kg). BP 138/88   Pulse 72   Temp (!) 96.9 F (36.1 C)   Ht '5\' 6"'  (1.676 m)   Wt 139 lb 6.4 oz (63.2 kg)   SpO2 98%   BMI 22.50 kg/m  General Appearance: Well nourished, in no apparent distress.  Eyes: PERRLA, EOMs, conjunctiva no swelling or erythema  Sinuses: No Frontal/maxillary tenderness  ENT/Mouth: Ext aud canals clear, normal light reflex with TMs without erythema, bulging. Good dentition. No erythema, swelling, or exudate on post pharynx. Tonsils not swollen or erythematous. Hearing normal. + TMJ Neck: Supple, thyroid normal. No bruits  Respiratory: Respiratory effort normal, BS equal bilaterally without rales, rhonchi, wheezing or stridor.  Cardio: RRR without murmurs, rubs or gallops. Brisk peripheral pulses without edema.  Chest: symmetric, with normal excursions and percussion.  Breasts: defer to GYN Abdomen: Soft, nontender, no guarding, rebound, hernias, masses, or organomegaly.  Lymphatics: Non tender without lymphadenopathy.  Genitourinary: defer to GYN Musculoskeletal: Full ROM all peripheral extremities,5/5 strength, and normal gait., normal distal neuro. + left lateral hip tenderness over greater trochanter Skin: Warm, dry without rashes, lesions, ecchymosis. Neuro: Cranial nerves intact,  reflexes equal bilaterally. Normal muscle tone, no cerebellar symptoms. Sensation intact.  Psych: Awake and oriented X 3, normal affect, Insight and Judgment appropriate.   EKG: patient declined, had WNL in ED 03/2021  [name], NP-C 12:51 PM [facility]

## 2021-08-29 ENCOUNTER — Encounter: Payer: Self-pay | Admitting: Adult Health

## 2021-08-29 ENCOUNTER — Ambulatory Visit: Payer: BC Managed Care – PPO | Admitting: Adult Health

## 2021-08-29 VITALS — BP 138/88 | HR 72 | Temp 96.9°F | Ht 66.0 in | Wt 139.4 lb

## 2021-08-29 DIAGNOSIS — F419 Anxiety disorder, unspecified: Secondary | ICD-10-CM

## 2021-08-29 DIAGNOSIS — Z1329 Encounter for screening for other suspected endocrine disorder: Secondary | ICD-10-CM

## 2021-08-29 DIAGNOSIS — Z136 Encounter for screening for cardiovascular disorders: Secondary | ICD-10-CM

## 2021-08-29 DIAGNOSIS — Z1389 Encounter for screening for other disorder: Secondary | ICD-10-CM

## 2021-08-29 DIAGNOSIS — E785 Hyperlipidemia, unspecified: Secondary | ICD-10-CM

## 2021-08-29 DIAGNOSIS — E538 Deficiency of other specified B group vitamins: Secondary | ICD-10-CM

## 2021-08-29 DIAGNOSIS — K22719 Barrett's esophagus with dysplasia, unspecified: Secondary | ICD-10-CM

## 2021-08-29 DIAGNOSIS — Z0001 Encounter for general adult medical examination with abnormal findings: Secondary | ICD-10-CM

## 2021-08-29 DIAGNOSIS — F4541 Pain disorder exclusively related to psychological factors: Secondary | ICD-10-CM

## 2021-08-29 DIAGNOSIS — E559 Vitamin D deficiency, unspecified: Secondary | ICD-10-CM | POA: Diagnosis not present

## 2021-08-29 DIAGNOSIS — Z6823 Body mass index (BMI) 23.0-23.9, adult: Secondary | ICD-10-CM

## 2021-08-29 DIAGNOSIS — M5030 Other cervical disc degeneration, unspecified cervical region: Secondary | ICD-10-CM

## 2021-08-29 DIAGNOSIS — M7060 Trochanteric bursitis, unspecified hip: Secondary | ICD-10-CM | POA: Insufficient documentation

## 2021-08-29 DIAGNOSIS — M8580 Other specified disorders of bone density and structure, unspecified site: Secondary | ICD-10-CM

## 2021-08-29 DIAGNOSIS — Z0000 Encounter for general adult medical examination without abnormal findings: Secondary | ICD-10-CM | POA: Diagnosis not present

## 2021-08-29 DIAGNOSIS — Z13220 Encounter for screening for lipoid disorders: Secondary | ICD-10-CM | POA: Diagnosis not present

## 2021-08-29 DIAGNOSIS — F988 Other specified behavioral and emotional disorders with onset usually occurring in childhood and adolescence: Secondary | ICD-10-CM

## 2021-08-29 DIAGNOSIS — Z131 Encounter for screening for diabetes mellitus: Secondary | ICD-10-CM

## 2021-08-29 DIAGNOSIS — Z79899 Other long term (current) drug therapy: Secondary | ICD-10-CM

## 2021-08-29 DIAGNOSIS — Z130 Encounter for screening for diseases of the blood and blood-forming organs and certain disorders involving the immune mechanism: Secondary | ICD-10-CM

## 2021-08-29 DIAGNOSIS — Z86018 Personal history of other benign neoplasm: Secondary | ICD-10-CM

## 2021-08-29 DIAGNOSIS — K2100 Gastro-esophageal reflux disease with esophagitis, without bleeding: Secondary | ICD-10-CM

## 2021-08-29 DIAGNOSIS — F339 Major depressive disorder, recurrent, unspecified: Secondary | ICD-10-CM

## 2021-08-29 DIAGNOSIS — R030 Elevated blood-pressure reading, without diagnosis of hypertension: Secondary | ICD-10-CM

## 2021-08-29 MED ORDER — AMPHETAMINE-DEXTROAMPHETAMINE 20 MG PO TABS: ORAL_TABLET | ORAL | 0 refills | Status: DC

## 2021-08-29 MED ORDER — ALPRAZOLAM 0.5 MG PO TABS: ORAL_TABLET | ORAL | 0 refills | Status: DC

## 2021-08-29 NOTE — Patient Instructions (Addendum)
Haley Sparks , Thank you for taking time to come for your Annual Wellness Visit. I appreciate your ongoing commitment to your health goals. Please review the following plan we discussed and let me know if I can assist you in the future.   These are the goals we discussed:  Goals      LDL CALC < 100        This is a list of the screening recommended for you and due dates:  Health Maintenance  Topic Date Due   Pap Smear  01/06/2017   COVID-19 Vaccine (3 - Pfizer risk series) 09/14/2021*   Zoster (Shingles) Vaccine (1 of 2) 11/29/2021*   Flu Shot  10/23/2021   Tetanus Vaccine  09/05/2022   Mammogram  05/18/2023   Colon Cancer Screening  01/02/2025   HPV Vaccine  Aged Out   Hepatitis C Screening: USPSTF Recommendation to screen - Ages 18-79 yo.  Discontinued   HIV Screening  Discontinued  *Topic was postponed. The date shown is not the original due date.     Know what a healthy weight is for you (roughly BMI <25) and aim to maintain this  Aim for 7+ servings of fruits and vegetables daily  65-80+ fluid ounces of water or unsweet tea for healthy kidneys  Limit to max 1 drink of alcohol per day; avoid smoking/tobacco  Limit animal fats in diet for cholesterol and heart health - choose grass fed whenever available  Avoid highly processed foods, and foods high in saturated/trans fats  Aim for low stress - take time to unwind and care for your mental health  Aim for 150 min of moderate intensity exercise weekly for heart health, and weights twice weekly for bone health  Aim for 7-9 hours of sleep daily      Hip Bursitis  Hip bursitis is the swelling of one or more of the fluid-filled sacs (bursae) in the hip joint. The hip bursae absorb shocks and prevent bones from rubbing against each other. If a bursa becomes irritated, it can fill with extra fluid and become inflamed. Hip bursitis can cause mild to moderate pain, and symptoms often come and go over time. What are the  causes? This condition results from increased friction between the hip bones and the tendons around the hip joint. This condition can happen if you: Overuse your hip muscles. Injure your hip. Have weak buttocks muscles. Have bone spurs. Have an infection. In some cases, the cause may not be known. What increases the risk? You are more likely to develop this condition if: You injured your hip previously or had hip surgery. You have a medical condition, such as arthritis, gout, diabetes, or thyroid disease. You have spine problems. You have one leg that is shorter than the other. You participate in athletic activities that include repetitive motion, like running. You participate in sports where there is a risk of injury or falling, such as football, martial arts, or skiing. What are the signs or symptoms? Symptoms may come and go, and they often include: Pain in the hip or groin area. Pain may get worse with movement. Tenderness and swelling of the hip. In rare cases, the bursa may become infected. If this happens, you may get a fever, as well as warmth and redness in the hip area. How is this diagnosed? This condition may be diagnosed based on: Your symptoms. Your medical history. A physical exam. Imaging tests, such as: X-rays to check your bones. MRI or ultrasound to  check your tendons and muscles. Bone scan. How is this treated? This condition is treated by resting, icing, applying pressure (compression), and raising (elevating) the injured area. This is called RICE treatment. In some cases, RICE treatment may not be enough to make your symptoms go away. Treatment may also include: Using crutches, a cane, or a walker to decrease the strain on your hip. Taking medicine to help with swelling and pain. Getting a shot of cortisone medicine near the affected area to reduce swelling and pain. Taking antibiotic medicines if there is an infection. Draining fluid out of the bursa to  help relieve swelling and pain. Having surgery to remove a damaged or infected bursa. This is rare. Long-term treatment may include: Physical therapy exercises for strength and flexibility. Identifying the cause of your bursitis to prevent future episodes. Lifestyle changes, such as weight loss, to reduce the strain on the hip. Follow these instructions at home: Managing pain, stiffness, and swelling     If directed, put ice on the affected area. To do this: Put ice in a plastic bag. Place a towel between your skin and the bag. Leave the ice on for 20 minutes, 2-3 times a day. Remove the ice if your skin turns bright red. This is very important. If you cannot feel pain, heat, or cold, you have a greater risk of damage to the area. Elevate your hip as much as you can without feeling pain. To do this, put a pillow under your hips while you lie down. If directed, apply heat to the affected area as often as told by your health care provider. Use the heat source that your health care provider recommends, such as a moist heat pack or a heating pad. Place a towel between your skin and the heat source. Leave the heat on for 20-30 minutes. Remove the heat if your skin turns bright red. This is especially important if you are unable to feel pain, heat, or cold. You may have a greater risk of getting burned. Activity Do not use your hip to support your body weight until your health care provider says that you can. Use crutches, a cane, or a walker as told by your health care provider. If the affected leg is one that you use to drive, ask your health care provider if it is safe to drive. Rest and protect your hip as much as possible until your pain and swelling get better. Return to your normal activities as told by your health care provider. Ask your health care provider what activities are safe for you. Do exercises as told by your health care provider. General instructions Take over-the-counter  and prescription medicines only as told by your health care provider. Gently massage and stretch your injured area as often as is comfortable. Wear compression wraps only as told by your health care provider. If one of your legs is shorter than the other, get fitted for a shoe insert or orthotic. Your health care provider or physical therapist can tell you where to find these items and what size you need. Maintain a healthy weight. Follow instructions from your health care provider for weight control. These may include dietary restrictions. Keep all follow-up visits. This is important. How is this prevented? Exercise regularly or as told by your health care provider. Wear supportive footwear that is appropriate for your sport and daily activities. Warm up and stretch before being active. Cool down and stretch after being active. Take breaks regularly from repetitive activity.  If an activity irritates your hip or causes pain, avoid the activity as much as possible. Avoid sitting down for long periods at a time. Where to find more information American [employer]: orthoinfo.aaos.org Contact a health care provider if: You have a fever. You develop new symptoms. You have trouble walking or doing everyday activities. You have pain that gets worse or does not get better with medicine. You develop red skin or a feeling of warmth in your hip area. Get help right away if: You cannot move your hip. You have severe pain. You cannot control the muscles in your feet. Summary Hip bursitis is the swelling of one or more of the fluid-filled sacs (bursae) in the hip joint. Hip bursitis can cause hip or groin pain, and symptoms often come and go over time. This condition is often treated by resting, icing, applying pressure (compression), and raising (elevating) the injured area. Other treatments may be needed. This information is not intended to replace advice given to you by your  health care provider. Make sure you discuss any questions you have with your health care provider. Document Revised: 03/06/2021 Document Reviewed: 03/06/2021 Elsevier Patient Education  [place].    Hip Bursitis Rehab Ask your health care provider which exercises are safe for you. Do exercises exactly as told by your health care provider and adjust them as directed. It is normal to feel mild stretching, pulling, tightness, or discomfort as you do these exercises. Stop right away if you feel sudden pain or your pain gets worse. Do not begin these exercises until told by your health care provider. Stretching exercise This exercise warms up your muscles and joints and improves the movement and flexibility of your hip. This exercise also helps to relieve pain and stiffness. Iliotibial band stretch An iliotibial band is a strong band of muscle tissue that runs from the outer side of your hip to the outer side of your thigh and knee. Lie on your side with your left / right leg in the top position. Bend your left / right knee and grab your ankle. Stretch out your bottom arm to help you balance. Slowly bring your knee back so your thigh is slightly behind your body. Slowly lower your knee toward the floor until you feel a gentle stretch on the outside of your left / right thigh. If you do not feel a stretch and your knee will not lower more toward the floor, place the heel of your other foot on top of your knee and pull your knee down toward the floor with your foot. Hold this position for __________ seconds. Slowly return to the starting position. Repeat __________ times. Complete this exercise __________ times a day. Strengthening exercises These exercises build strength and endurance in your hip and pelvis. Endurance is the ability to use your muscles for a long time, even after they get tired. Bridge This exercise strengthens the muscles that move your thigh backward (hip extensors). Lie  on your back on a firm surface with your knees bent and your feet flat on the floor. Tighten your buttocks muscles and lift your buttocks off the floor until your trunk is level with your thighs. Do not arch your back. You should feel the muscles working in your buttocks and the back of your thighs. If you do not feel these muscles, slide your feet 1-2 inches (2.5-5 cm) farther away from your buttocks. If this exercise is too easy, try doing it with your  arms crossed over your chest. Hold this position for __________ seconds. Slowly lower your hips to the starting position. Let your muscles relax completely after each repetition. Repeat __________ times. Complete this exercise __________ times a day. Squats This exercise strengthens the muscles in front of your thigh and knee (quadriceps). Stand in front of a table, with your feet and knees pointing straight ahead. You may rest your hands on the table for balance but not for support. Slowly bend your knees and lower your hips like you are going to sit in a chair. Keep your weight over your heels, not over your toes. Keep your lower legs upright so they are parallel with the table legs. Do not let your hips go lower than your knees. Do not bend lower than told by your health care provider. If your hip pain increases, do not bend as low. Hold the squat position for __________ seconds. Slowly push with your legs to return to standing. Do not use your hands to pull yourself to standing. Repeat __________ times. Complete this exercise __________ times a day. Hip hike  Stand sideways on a bottom step. Stand on your left / right leg with your other foot unsupported next to the step. You can hold on to the railing or wall for balance if needed. Keep your knees straight and your torso square. Then lift your left / right hip up toward the ceiling. Hold this position for __________ seconds. Slowly let your left / right hip lower toward the floor, past  the starting position. Your foot should get closer to the floor. Do not lean or bend your knees. Repeat __________ times. Complete this exercise __________ times a day. Single leg stand This exercise increases your balance. Without shoes, stand near a railing or in a doorway. You may hold on to the railing or door frame as needed for balance. Squeeze your left / right buttock muscles, then lift up your other foot. Do not let your left / right hip push out to the side. It is helpful to stand in front of a mirror for this exercise so you can watch your hip. Hold this position for __________ seconds. Repeat __________ times. Complete this exercise __________ times a day. This information is not intended to replace advice given to you by your health care provider. Make sure you discuss any questions you have with your health care provider. Document Revised: 02/21/2021 Document Reviewed: 02/21/2021 Elsevier Patient Education  [place].

## 2021-08-30 ENCOUNTER — Encounter: Payer: Self-pay | Admitting: Adult Health

## 2021-08-30 ENCOUNTER — Other Ambulatory Visit: Payer: Self-pay | Admitting: Adult Health

## 2021-08-30 DIAGNOSIS — K2270 Barrett's esophagus without dysplasia: Secondary | ICD-10-CM | POA: Diagnosis not present

## 2021-08-30 DIAGNOSIS — D649 Anemia, unspecified: Secondary | ICD-10-CM

## 2021-08-30 DIAGNOSIS — Z1211 Encounter for screening for malignant neoplasm of colon: Secondary | ICD-10-CM | POA: Diagnosis not present

## 2021-08-30 LAB — LIPID PANEL
Cholesterol: 215 mg/dL — ABNORMAL HIGH (ref <200)
HDL: 86 mg/dL (ref >=50)
LDL Cholesterol (Calc): 109 mg/dL (calc) — ABNORMAL HIGH
Non-HDL Cholesterol (Calc): 129 mg/dL (calc) (ref <130)
Total CHOL/HDL Ratio: 2.5 (calc) (ref <5.0)
Triglycerides: 103 mg/dL (ref <150)

## 2021-08-30 LAB — HEMOGLOBIN A1C
Hgb A1c MFr Bld: 5 % of total Hgb (ref <5.7)
Mean Plasma Glucose: 97 mg/dL
eAG (mmol/L): 5.4 mmol/L

## 2021-08-30 LAB — CBC WITH DIFFERENTIAL/PLATELET
Absolute Monocytes: 720 cells/uL (ref 200–950)
Basophils Absolute: 47 cells/uL (ref 0–200)
Basophils Relative: 0.8 %
Eosinophils Absolute: 201 cells/uL (ref 15–500)
Eosinophils Relative: 3.4 %
HCT: 33.5 % — ABNORMAL LOW (ref 35.0–45.0)
Hemoglobin: 11.1 g/dL — ABNORMAL LOW (ref 11.7–15.5)
Lymphs Abs: 2165 cells/uL (ref 850–3900)
MCH: 32.6 pg (ref 27.0–33.0)
MCHC: 33.1 g/dL (ref 32.0–36.0)
MCV: 98.5 fL (ref 80.0–100.0)
MPV: 9.8 fL (ref 7.5–12.5)
Monocytes Relative: 12.2 %
Neutro Abs: 2767 cells/uL (ref 1500–7800)
Neutrophils Relative %: 46.9 %
Platelets: 266 10*3/uL (ref 140–400)
RBC: 3.4 10*6/uL — ABNORMAL LOW (ref 3.80–5.10)
RDW: 12.1 % (ref 11.0–15.0)
Total Lymphocyte: 36.7 %
WBC: 5.9 10*3/uL (ref 3.8–10.8)

## 2021-08-30 LAB — COMPLETE METABOLIC PANEL WITH GFR
AG Ratio: 1.6 (calc) (ref 1.0–2.5)
ALT: 9 U/L (ref 6–29)
AST: 12 U/L (ref 10–35)
Albumin: 4 g/dL (ref 3.6–5.1)
Alkaline phosphatase (APISO): 54 U/L (ref 37–153)
BUN: 14 mg/dL (ref 7–25)
CO2: 30 mmol/L (ref 20–32)
Calcium: 9.4 mg/dL (ref 8.6–10.4)
Chloride: 104 mmol/L (ref 98–110)
Creat: 0.64 mg/dL (ref 0.50–1.03)
Globulin: 2.5 g/dL (calc) (ref 1.9–3.7)
Glucose, Bld: 77 mg/dL (ref 65–99)
Potassium: 4.9 mmol/L (ref 3.5–5.3)
Sodium: 139 mmol/L (ref 135–146)
Total Bilirubin: 0.6 mg/dL (ref 0.2–1.2)
Total Protein: 6.5 g/dL (ref 6.1–8.1)
eGFR: 102 mL/min/{1.73_m2} (ref >=60)

## 2021-08-30 LAB — URINALYSIS, ROUTINE W REFLEX MICROSCOPIC
Bilirubin Urine: NEGATIVE
Glucose, UA: NEGATIVE
Hgb urine dipstick: NEGATIVE
Ketones, ur: NEGATIVE
Leukocytes,Ua: NEGATIVE
Nitrite: NEGATIVE
Protein, ur: NEGATIVE
Specific Gravity, Urine: 1.006 (ref 1.001–1.035)
pH: 5.5 (ref 5.0–8.0)

## 2021-08-30 LAB — MAGNESIUM: Magnesium: 1.9 mg/dL (ref 1.5–2.5)

## 2021-08-30 LAB — IRON,TIBC AND FERRITIN PANEL
%SAT: 40 % (calc) (ref 16–45)
Ferritin: 19 ng/mL (ref 16–232)
Iron: 134 ug/dL (ref 45–160)
TIBC: 335 mcg/dL (calc) (ref 250–450)

## 2021-08-30 LAB — VITAMIN D 25 HYDROXY (VIT D DEFICIENCY, FRACTURES): Vit D, 25-Hydroxy: 30 ng/mL (ref 30–100)

## 2021-08-30 LAB — TEST AUTHORIZATION

## 2021-08-30 LAB — MICROALBUMIN / CREATININE URINE RATIO
Creatinine, Urine: 21 mg/dL (ref 20–275)
Microalb, Ur: <0.2 mg/dL

## 2021-08-30 LAB — VITAMIN B12: Vitamin B-12: 274 pg/mL (ref 200–1100)

## 2021-08-30 LAB — TSH: TSH: 2.26 mIU/L (ref 0.40–4.50)

## 2021-09-27 ENCOUNTER — Other Ambulatory Visit: Payer: Self-pay | Admitting: Internal Medicine

## 2021-10-08 ENCOUNTER — Other Ambulatory Visit: Payer: BC Managed Care – PPO

## 2021-10-09 ENCOUNTER — Other Ambulatory Visit: Payer: BC Managed Care – PPO

## 2021-10-09 ENCOUNTER — Ambulatory Visit
Admission: RE | Admit: 2021-10-09 | Discharge: 2021-10-09 | Disposition: A | Discharge status: Home / Self Care | Payer: BC Managed Care – PPO | Source: Ambulatory Visit | Attending: Adult Health | Admitting: Adult Health

## 2021-10-09 DIAGNOSIS — D649 Anemia, unspecified: Secondary | ICD-10-CM | POA: Diagnosis not present

## 2021-10-09 DIAGNOSIS — M8580 Other specified disorders of bone density and structure, unspecified site: Secondary | ICD-10-CM

## 2021-10-09 DIAGNOSIS — M85852 Other specified disorders of bone density and structure, left thigh: Secondary | ICD-10-CM | POA: Diagnosis not present

## 2021-10-09 DIAGNOSIS — Z780 Asymptomatic menopausal state: Secondary | ICD-10-CM | POA: Diagnosis not present

## 2021-10-09 DIAGNOSIS — D529 Folate deficiency anemia, unspecified: Secondary | ICD-10-CM | POA: Diagnosis not present

## 2021-10-09 DIAGNOSIS — M810 Age-related osteoporosis without current pathological fracture: Secondary | ICD-10-CM | POA: Diagnosis not present

## 2021-10-16 ENCOUNTER — Ambulatory Visit: Payer: BC Managed Care – PPO | Admitting: Nurse Practitioner

## 2021-10-16 DIAGNOSIS — Z1211 Encounter for screening for malignant neoplasm of colon: Secondary | ICD-10-CM | POA: Diagnosis not present

## 2021-10-17 LAB — CBC WITH DIFFERENTIAL/PLATELET
Absolute Monocytes: 520 cells/uL (ref 200–950)
Basophils Absolute: 41 cells/uL (ref 0–200)
Basophils Relative: 0.9 %
Eosinophils Absolute: 179 cells/uL (ref 15–500)
Eosinophils Relative: 3.9 %
HCT: 37.6 % (ref 35.0–45.0)
Hemoglobin: 12.7 g/dL (ref 11.7–15.5)
Lymphs Abs: 1748 cells/uL (ref 850–3900)
MCH: 33.9 pg — ABNORMAL HIGH (ref 27.0–33.0)
MCHC: 33.8 g/dL (ref 32.0–36.0)
MCV: 100.3 fL — ABNORMAL HIGH (ref 80.0–100.0)
MPV: 9.7 fL (ref 7.5–12.5)
Monocytes Relative: 11.3 %
Neutro Abs: 2111 cells/uL (ref 1500–7800)
Neutrophils Relative %: 45.9 %
Platelets: 282 10*3/uL (ref 140–400)
RBC: 3.75 10*6/uL — ABNORMAL LOW (ref 3.80–5.10)
RDW: 11.9 % (ref 11.0–15.0)
Total Lymphocyte: 38 %
WBC: 4.6 10*3/uL (ref 3.8–10.8)

## 2021-10-17 LAB — RETICULOCYTES
ABS Retic: 41250 cells/uL (ref 20000–80000)
Retic Ct Pct: 1.1 %

## 2021-10-17 LAB — FOLATE RBC: RBC Folate: 591 ng/mL RBC (ref >280)

## 2021-10-22 DIAGNOSIS — K2270 Barrett's esophagus without dysplasia: Secondary | ICD-10-CM | POA: Diagnosis not present

## 2021-10-22 DIAGNOSIS — Z1211 Encounter for screening for malignant neoplasm of colon: Secondary | ICD-10-CM | POA: Diagnosis not present

## 2021-10-26 ENCOUNTER — Ambulatory Visit: Payer: BC Managed Care – PPO | Admitting: Nurse Practitioner

## 2021-11-01 ENCOUNTER — Ambulatory Visit: Payer: BC Managed Care – PPO | Admitting: Nurse Practitioner

## 2021-11-01 ENCOUNTER — Encounter: Payer: Self-pay | Admitting: Nurse Practitioner

## 2021-11-01 VITALS — BP 114/78 | HR 83 | Temp 97.7°F | Ht 66.0 in | Wt 130.0 lb

## 2021-11-01 DIAGNOSIS — M810 Age-related osteoporosis without current pathological fracture: Secondary | ICD-10-CM

## 2021-11-01 DIAGNOSIS — Z79899 Other long term (current) drug therapy: Secondary | ICD-10-CM

## 2021-11-01 DIAGNOSIS — K22719 Barrett's esophagus with dysplasia, unspecified: Secondary | ICD-10-CM

## 2021-11-01 DIAGNOSIS — F988 Other specified behavioral and emotional disorders with onset usually occurring in childhood and adolescence: Secondary | ICD-10-CM

## 2021-11-01 DIAGNOSIS — F419 Anxiety disorder, unspecified: Secondary | ICD-10-CM | POA: Diagnosis not present

## 2021-11-01 DIAGNOSIS — F339 Major depressive disorder, recurrent, unspecified: Secondary | ICD-10-CM | POA: Diagnosis not present

## 2021-11-01 DIAGNOSIS — G44209 Tension-type headache, unspecified, not intractable: Secondary | ICD-10-CM

## 2021-11-01 DIAGNOSIS — F4541 Pain disorder exclusively related to psychological factors: Secondary | ICD-10-CM

## 2021-11-01 MED ORDER — BUTALBITAL-APAP-CAFFEINE 50-325-40 MG PO TABS: 1.0000 | ORAL_TABLET | Freq: Four times a day (QID) | ORAL | 0 refills | Status: DC | PRN

## 2021-11-01 NOTE — Progress Notes (Signed)
Refer to Rheumatology for injection  Assessment and Plan:  Haley Sparks was seen today for a follow up.  Diagnoses and all order for this visit:  1. Age-related osteoporosis without current pathological fracture Refer to Rheumatology for further review and evaluation of medication management.  Not able to take Fosamax. Pursue a combination of weight-bearing exercises and strength training. Advised on fall prevention measures including proper lighting in all rooms, removal of area rugs and floor clutter, use of walking devices as deemed appropriate, avoidance of uneven walking surfaces. Smoking cessation and moderate alcohol consumption if applicable Consume 462 to 1000 IU of vitamin D daily with a goal vitamin D serum value of 30 ng/mL or higher. Aim for 1000 to 1200 mg of elemental calcium daily through supplements and/or dietary sources.   2. Barrett's esophagus with dysplasia Lifestyle modification:  wt loss, avoid meals 2-3h before bedtime. Continue to monitor  3. Depression, recurrent (HCC) Continue Wellbutrin. Reviewed relaxation techniques.  Sleep hygiene. Recommended mindfulness meditation and exercise.   Psychoeducation:  encouraged personality growth wand development through coping techniques and problem-solving skills. Limit/Decrease/Monitor drug/alcohol intake.     4. Anxiety Continue Xanax PRN only.   Not intended for every day use. Reviewed relaxation techniques.  Sleep hygiene. Recommended mindfulness meditation and exercise.   Psychoeducation:  encouraged personality growth wand development through coping techniques and problem-solving skills. Limit/Decrease/Monitor drug/alcohol intake.   Not due for refill  5. Attention deficit disorder (ADD) without hyperactivity Continue Adderall as directed. Not due for refill  6. Stress headaches Stay well hydrated. Avoid triggers. Fioricet PRN - refilled  Meds ordered this encounter  Medications    butalbital-acetaminophen-caffeine (FIORICET) 50-325-40 MG tablet    Sig: Take 1 tablet by mouth every 6 (six) hours as needed for headache. Limit to 2 doses/week.    Dispense:  30 tablet    Refill:  0    Order Specific Question:   Supervising Provider    Answer:   Unk Pinto [[id number]]     7. Medication management All medications discussed and reviewed in full. All questions and concerns regarding medications addressed.     Notify office for further evaluation and treatment, questions or concerns if s/s fail to improve. The risks and benefits of my recommendations, as well as other treatment options were discussed with the patient today. Questions were answered.  Further disposition pending results of labs. Discussed med's effects and SE's.    Over 20 minutes of exam, counseling, chart review, and critical decision making was performed.   Future Appointments  Date Time Provider [place]  02/28/2022  9:30 AM Darrol Jump, NP [facility] None  08/30/2022  9:00 AM Alycia Rossetti, NP [facility] None    ------------------------------------------------------------------------------------------------------------------   HPI BP 114/78   Pulse 83   Temp 97.7 F (36.5 C)   Ht '5\' 6"'$  (1.676 m)   Wt 130 lb (59 kg)   SpO2 99%   BMI 20.98 kg/m   61 y.o.female presents for evaluation and treatment of osteoporosis.  She had a recent DEXA study that revealed -2.9 T Score.  She has researched starting Fosamax but is hesitant due to the SE, as well as hx of Barrett's esophagus.  She currently takes Estradiol HRT and follows with GYN.  She currently takes Wellbutrin and Xanax for control of anxiety and depression.  Well controlled on this regimen.  Tries to avoid triggers.  Requesting a refill today.  She take Adderall for treatment of ADHD.  Currently well  controlled.  Requesting a refill today.    Has occasional migraine triggered by stress.  Takes Fiorcet PRN which is  effective.  She tries to stay well hydrated and avoid triggers.  Past Medical History:  Diagnosis Date   Allergy    Anxiety    Barrett's esophagus    Depression    GERD (gastroesophageal reflux disease)    History of bladder surgery    Liver cyst 06/07/2015   Benign appearing liver cysts noted incidentally on multiple imaging scans. No further workup recommended.    Miscarriage    twice   Peptic ulcer disease    Renal disorder    kidney stones     Allergies  Allergen Reactions   Brintellix [Vortioxetine] Nausea Only    Current Outpatient Medications on File Prior to Visit  Medication Sig   Adapalene-Benzoyl Peroxide (EPIDUO) 0.1-2.5 % gel APPLY A SMALL AMOUNT AT BEDTIME AS NEEDED   ALPRAZolam (XANAX) 0.5 MG tablet Take 1/2 to 1 tablet 2 to 3 x / day ONLY if needed for Anxiety Attack or Sleep  & please try to limit to 5 days / week to avoid addiction   amphetamine-dextroamphetamine (ADDERALL) 20 MG tablet Take 1/2-1 tablet 2 x /day as needed for Focus & Concentration   buPROPion (WELLBUTRIN XL) 150 MG 24 hr tablet TAKE 1 TABLET BY MOUTH DAILY FOR MOOD OR FOCUS OR CONCENTRATION   butalbital-acetaminophen-caffeine (FIORICET) 50-325-40 MG tablet Take 1 tablet by mouth every 6 (six) hours as needed for headache. Limit to 2 doses/week.   estradiol (ESTRACE) 2 MG tablet TAKE 1 TABLET(2 MG) BY MOUTH DAILY (Patient taking differently: Take 2 mg by mouth daily.)   ibuprofen (ADVIL,MOTRIN) 400 MG tablet Take 1 tablet (400 mg total) by mouth every 6 (six) hours as needed.   meclizine (ANTIVERT) 25 MG tablet Take 1 tablet (25 mg total) by mouth 3 (three) times daily as needed for dizziness or nausea.   omeprazole (PRILOSEC) 40 MG capsule Take 40 mg by mouth daily.   tretinoin (RETIN-A) 0.1 % cream Apply topically at bedtime.   dexlansoprazole (DEXILANT) 60 MG capsule Take 60 mg by mouth daily. (Patient not taking: Reported on 11/01/2021)   No current facility-administered medications on file  prior to visit.    ROS: all negative except what is noted in the HPI.   Physical Exam:  BP 114/78   Pulse 83   Temp 97.7 F (36.5 C)   Ht '5\' 6"'$  (1.676 m)   Wt 130 lb (59 kg)   SpO2 99%   BMI 20.98 kg/m   General Appearance: NAD.  Awake, conversant and cooperative. Eyes: PERRLA, EOMs intact.  Sclera white.  Conjunctiva without erythema. Sinuses: No frontal/maxillary tenderness.  No nasal discharge. Nares patent.  ENT/Mouth: Ext aud canals clear.  Bilateral TMs w/DOL and without erythema or bulging. Hearing intact.  Posterior pharynx without swelling or exudate.  Tonsils without swelling or erythema.  Neck: Supple.  No masses, nodules or thyromegaly. Respiratory: Effort is regular with non-labored breathing. Breath sounds are equal bilaterally without rales, rhonchi, wheezing or stridor.  Cardio: RRR with no MRGs. Brisk peripheral pulses without edema.  Abdomen: Active BS in all four quadrants.  Soft and non-tender without guarding, rebound tenderness, hernias or masses. Lymphatics: Non tender without lymphadenopathy.  Musculoskeletal: Full ROM, 5/5 strength, normal ambulation.  No clubbing or cyanosis. Skin: Appropriate color for ethnicity. Warm without rashes, lesions, ecchymosis, ulcers.  Neuro: CN II-XII grossly normal. Normal muscle tone without cerebellar  symptoms and intact sensation.   Psych: AO X 3,  appropriate mood and affect, insight and judgment.     Darrol Jump, NP 10:31 AM [facility]

## 2021-11-01 NOTE — Patient Instructions (Signed)
Pursue a combination of weight-bearing exercises and strength training. Patients with severe mobility impairment should be referred for physical therapy. Advised on fall prevention measures including proper lighting in all rooms, removal of area rugs and floor clutter, use of walking devices as deemed appropriate, avoidance of uneven walking surfaces. Smoking cessation and moderate alcohol consumption if applicable Consume 809 to 1000 IU of vitamin D daily with a goal vitamin D serum value of 30 ng/mL or higher. Aim for 1000 to 1200 mg of elemental calcium daily through supplements and/or dietary sources.  Osteoporosis Injectables:  Prolia, Reclast, Boniva  Osteoporosis  Osteoporosis is when the bones get thin and weak. This can cause your bones to break (fracture) more easily. What are the causes? The exact cause of this condition is not known. What increases the risk? Having family members with this condition. Not eating enough healthy foods. Taking certain medicines. Being female. Being age 26 or older. Smoking or using other products that contain nicotine or tobacco, such as e-cigarettes or chewing tobacco. Not exercising. Being of European or Asian ancestry. Having a small body frame. What are the signs or symptoms? A broken bone might be the first sign, especially if the break results from a fall or injury that usually would not cause a bone to break. Other signs and symptoms include: Pain in the neck or low back. Being hunched over (stooped posture). Getting shorter. How is this treated? Eating more foods with more calcium and vitamin D in them. Doing exercises. Stopping tobacco use. Limiting how much alcohol you drink. Taking medicines to slow bone loss or help make the bones stronger. Taking supplements of calcium and vitamin D every day. Taking medicines to replace chemicals in the body (hormone replacement medicines). Monitoring your levels of calcium and vitamin  D. The goal of treatment is to strengthen your bones and lower your risk for a bone break. Follow these instructions at home: Eating and drinking Eat plenty of calcium and vitamin D. These nutrients are good for your bones. Good sources of calcium and vitamin D include: Some fish, such as salmon and tuna. Foods that have calcium and vitamin D added to them (fortified foods), such as some breakfast cereals. Egg yolks. Cheese. Liver.  Activity Do exercises as told by your doctor. Ask your doctor what exercises are safe for you. You should do: Exercises that make your muscles work to hold your body weight up (weight-bearing exercises). These include tai chi, yoga, and walking. Exercises to make your muscles stronger. One example is lifting weights. Lifestyle Do not drink alcohol if: Your doctor tells you not to drink. You are pregnant, may be pregnant, or are planning to become pregnant. If you drink alcohol: Limit how much you use to: 0-1 drink a day for women. 0-2 drinks a day for men. Know how much alcohol is in your drink. In the [place] one drink equals one 12 oz bottle of beer (355 mL), one 5 oz glass of wine (148 mL), or one 1 oz glass of hard liquor (44 mL). Do not smoke or use any products that contain nicotine or tobacco. If you need help quitting, ask your doctor. Preventing falls Use tools to help you move around (mobility aids) as needed. These include canes, walkers, scooters, and crutches. Keep rooms well-lit. Put away things on the floor that could make you trip. These include cords and rugs. Install safety rails on stairs. Install grab bars in bathrooms. Use rubber mats in slippery areas, like  bathrooms. Wear shoes that: Fit you well. Support your feet. Have closed toes. Have rubber soles or low heels. Tell your doctor about all of the medicines you are taking. Some medicines can make you more likely to fall. General instructions Take over-the-counter and  prescription medicines only as told by your doctor. Keep all follow-up visits. Contact a doctor if: You have not been tested (screened) for osteoporosis and you are: A woman who is age 59 or older. A man who is age 59 or older. Get help right away if: You fall. You get hurt. Summary Osteoporosis happens when your bones get thin and weak. Weak bones can break (fracture) more easily. Eat plenty of calcium and vitamin D. These are good for your bones. Tell your doctor about all of the medicines that you take. This information is not intended to replace advice given to you by your health care provider. Make sure you discuss any questions you have with your health care provider. Document Revised: 08/26/2019 Document Reviewed: 08/26/2019 Elsevier Patient Education  [place].

## 2021-11-08 ENCOUNTER — Encounter: Payer: Self-pay | Admitting: Nurse Practitioner

## 2021-11-08 DIAGNOSIS — F339 Major depressive disorder, recurrent, unspecified: Secondary | ICD-10-CM

## 2021-11-08 DIAGNOSIS — F419 Anxiety disorder, unspecified: Secondary | ICD-10-CM

## 2021-11-08 MED ORDER — AMPHETAMINE-DEXTROAMPHETAMINE 20 MG PO TABS: ORAL_TABLET | ORAL | 0 refills | Status: DC

## 2021-11-08 MED ORDER — TRETINOIN 0.1 % EX CREA
TOPICAL_CREAM | Freq: Every day | CUTANEOUS | 0 refills | Status: DC
Start: 2021-11-08 — End: 2022-05-01

## 2021-11-10 DIAGNOSIS — K2270 Barrett's esophagus without dysplasia: Secondary | ICD-10-CM | POA: Diagnosis not present

## 2021-11-16 ENCOUNTER — Encounter: Payer: Self-pay | Admitting: Nurse Practitioner

## 2021-11-16 DIAGNOSIS — F419 Anxiety disorder, unspecified: Secondary | ICD-10-CM

## 2021-11-16 DIAGNOSIS — M810 Age-related osteoporosis without current pathological fracture: Secondary | ICD-10-CM

## 2021-11-16 DIAGNOSIS — K22719 Barrett's esophagus with dysplasia, unspecified: Secondary | ICD-10-CM

## 2021-11-28 ENCOUNTER — Encounter: Payer: BC Managed Care – PPO | Admitting: Adult Health

## 2021-12-05 MED ORDER — ALPRAZOLAM 0.5 MG PO TABS: ORAL_TABLET | ORAL | 0 refills | Status: DC

## 2021-12-19 DIAGNOSIS — K648 Other hemorrhoids: Secondary | ICD-10-CM | POA: Diagnosis not present

## 2021-12-19 DIAGNOSIS — K2270 Barrett's esophagus without dysplasia: Secondary | ICD-10-CM | POA: Diagnosis not present

## 2022-01-02 ENCOUNTER — Encounter: Payer: Self-pay | Admitting: Nurse Practitioner

## 2022-01-02 MED ORDER — MECLIZINE HCL 25 MG PO TABS: 25.0000 mg | ORAL_TABLET | Freq: Three times a day (TID) | ORAL | 1 refills | Status: DC | PRN

## 2022-01-08 DIAGNOSIS — M25521 Pain in right elbow: Secondary | ICD-10-CM | POA: Diagnosis not present

## 2022-01-08 DIAGNOSIS — M25522 Pain in left elbow: Secondary | ICD-10-CM | POA: Diagnosis not present

## 2022-01-08 DIAGNOSIS — M25559 Pain in unspecified hip: Secondary | ICD-10-CM | POA: Diagnosis not present

## 2022-01-08 DIAGNOSIS — M810 Age-related osteoporosis without current pathological fracture: Secondary | ICD-10-CM | POA: Diagnosis not present

## 2022-01-29 DIAGNOSIS — M810 Age-related osteoporosis without current pathological fracture: Secondary | ICD-10-CM | POA: Diagnosis not present

## 2022-01-31 ENCOUNTER — Encounter: Payer: Self-pay | Admitting: Nurse Practitioner

## 2022-01-31 MED ORDER — BUPROPION HCL ER (XL) 150 MG PO TB24
ORAL_TABLET | ORAL | 3 refills | Status: DC
Start: 2022-01-31 — End: 2022-05-01

## 2022-01-31 MED ORDER — ESTRADIOL 2 MG PO TABS: 2.0000 mg | ORAL_TABLET | Freq: Every day | ORAL | 0 refills | Status: DC

## 2022-02-28 ENCOUNTER — Ambulatory Visit: Payer: BC Managed Care – PPO | Admitting: Nurse Practitioner

## 2022-02-28 ENCOUNTER — Encounter: Payer: Self-pay | Admitting: Nurse Practitioner

## 2022-02-28 VITALS — BP 120/80 | HR 88 | Temp 97.3°F | Wt 135.0 lb

## 2022-02-28 DIAGNOSIS — Z79899 Other long term (current) drug therapy: Secondary | ICD-10-CM

## 2022-02-28 DIAGNOSIS — E785 Hyperlipidemia, unspecified: Secondary | ICD-10-CM | POA: Diagnosis not present

## 2022-02-28 DIAGNOSIS — K22719 Barrett's esophagus with dysplasia, unspecified: Secondary | ICD-10-CM | POA: Diagnosis not present

## 2022-02-28 DIAGNOSIS — R42 Dizziness and giddiness: Secondary | ICD-10-CM

## 2022-02-28 DIAGNOSIS — F419 Anxiety disorder, unspecified: Secondary | ICD-10-CM

## 2022-02-28 DIAGNOSIS — F339 Major depressive disorder, recurrent, unspecified: Secondary | ICD-10-CM | POA: Diagnosis not present

## 2022-02-28 DIAGNOSIS — E782 Mixed hyperlipidemia: Secondary | ICD-10-CM

## 2022-02-28 DIAGNOSIS — F988 Other specified behavioral and emotional disorders with onset usually occurring in childhood and adolescence: Secondary | ICD-10-CM

## 2022-02-28 DIAGNOSIS — M810 Age-related osteoporosis without current pathological fracture: Secondary | ICD-10-CM

## 2022-02-28 DIAGNOSIS — F4541 Pain disorder exclusively related to psychological factors: Secondary | ICD-10-CM

## 2022-02-28 MED ORDER — ALPRAZOLAM 0.5 MG PO TABS: ORAL_TABLET | ORAL | 0 refills | Status: DC

## 2022-02-28 MED ORDER — MECLIZINE HCL 50 MG PO TABS: 50.0000 mg | ORAL_TABLET | Freq: Every day | ORAL | 1 refills | Status: AC

## 2022-02-28 NOTE — Patient Instructions (Addendum)
Vertigo Vertigo is the feeling that you or the things around you are moving when they are not. This feeling can come and go at any time. Vertigo often goes away on its own. This condition can be dangerous if it happens when you are doing activities like driving or working with machines. Your doctor will do tests to find the cause of your vertigo. These tests will also help your doctor decide on the best treatment for you. Follow these instructions at home: Eating and drinking     Drink enough fluid to keep your pee (urine) pale yellow. Do not drink alcohol. Activity Return to your normal activities when your doctor says that it is safe. In the morning, first sit up on the side of the bed. When you feel okay, stand slowly while you hold onto something until you know that your balance is fine. Move slowly. Avoid sudden body or head movements or certain positions, as told by your doctor. Use a cane if you have trouble standing or walking. Sit down right away if you feel dizzy. Avoid doing any tasks or activities that can cause danger to you or others if you get dizzy. Avoid bending down if you feel dizzy. Place items in your home so that they are easy for you to reach without bending or leaning over. Do not drive or use machinery if you feel dizzy. General instructions Take over-the-counter and prescription medicines only as told by your doctor. Keep all follow-up visits. Contact a doctor if: Your medicine does not help your vertigo. Your problems get worse or you have new symptoms. You have a fever. You feel like you may vomit (nauseous), or this feeling gets worse. You start to vomit. Your family or friends see changes in how you act. You lose feeling (have numbness) in part of your body. You feel prickling and tingling in a part of your body. Get help right away if: You are always dizzy. You faint. You get very bad headaches. You get a stiff neck. Bright light starts to bother  you. You have trouble moving or talking. You feel weak in your hands, arms, or legs. You have changes in your hearing or in how you see (vision). These symptoms may be an emergency. Get help right away. Call your local emergency services (911 in the [place] Do not wait to see if the symptoms will go away. Do not drive yourself to the hospital. Summary Vertigo is the feeling that you or the things around you are moving when they are not. Your doctor will do tests to find the cause of your vertigo. You may be told to avoid some tasks, positions, or movements. Contact a doctor if your medicine is not helping, or if you have a fever, new symptoms, or a change in how you act. Get help right away if you get very bad headaches, or if you have changes in how you speak, hear, or see. This information is not intended to replace advice given to you by your health care provider. Make sure you discuss any questions you have with your health care provider. Document Revised: 02/09/2020 Document Reviewed: 02/09/2020 Elsevier Patient Education  [place].  How to Perform the Epley Maneuver The Epley maneuver is an exercise that relieves symptoms of vertigo. Vertigo is the feeling that you or your surroundings are moving when they are not. When you feel vertigo, you may feel like the room is spinning and may have trouble walking. The Epley maneuver  is used for a type of vertigo caused by a calcium deposit in a part of the inner ear. The maneuver involves changing head positions to help the deposit move out of the area. You can do this maneuver at home whenever you have symptoms of vertigo. You can repeat it in 24 hours if your vertigo has not gone away. Even though the Epley maneuver may relieve your vertigo for a few weeks, it is possible that your symptoms will return. This maneuver relieves vertigo, but it does not relieve dizziness. What are the risks? If it is done correctly, the Epley maneuver is  considered safe. Sometimes it can lead to dizziness or nausea that goes away after a short time. If you develop other symptoms--such as changes in vision, weakness, or numbness--stop doing the maneuver and call your health care provider. Supplies needed: A bed or table. A pillow. How to do the Epley maneuver     Sit on the edge of a bed or table with your back straight and your legs extended or hanging over the edge of the bed or table. Turn your head halfway toward the affected ear or side as told by your health care provider. Lie backward quickly with your head turned until you are lying flat on your back. Your head should dangle (head-hanging position). You may want to position a pillow under your shoulders. Hold this position for at least 30 seconds. If you feel dizzy or have symptoms of vertigo, continue to hold the position until the symptoms stop. Turn your head to the opposite direction until your unaffected ear is facing down. Your head should continue to dangle. Hold this position for at least 30 seconds. If you feel dizzy or have symptoms of vertigo, continue to hold the position until the symptoms stop. Turn your whole body to the same side as your head so that you are positioned on your side. Your head will now be nearly facedown and no longer needs to dangle. Hold for at least 30 seconds. If you feel dizzy or have symptoms of vertigo, continue to hold the position until the symptoms stop. Sit back up. You can repeat the maneuver in 24 hours if your vertigo does not go away. Follow these instructions at home: For 24 hours after doing the Epley maneuver: Keep your head in an upright position. When lying down to sleep or rest, keep your head raised (elevated) with two or more pillows. Avoid excessive neck movements. Activity Do not drive or use machinery if you feel dizzy. After doing the Epley maneuver, return to your normal activities as told by your health care provider. Ask  your health care provider what activities are safe for you. General instructions Drink enough fluid to keep your urine pale yellow. Do not drink alcohol. Take over-the-counter and prescription medicines only as told by your health care provider. Keep all follow-up visits. This is important. Preventing vertigo symptoms Ask your health care provider if there is anything you should do at home to prevent vertigo. He or she may recommend that you: Keep your head elevated with two or more pillows while you sleep. Do not sleep on the side of your affected ear. Get up slowly from bed. Avoid sudden movements during the day. Avoid extreme head positions or movement, such as looking up or bending over. Contact a health care provider if: Your vertigo gets worse. You have other symptoms, including: Nausea. Vomiting. Headache. Get help right away if you: Have vision changes. Have  a headache or neck pain that is severe or getting worse. Cannot stop vomiting. Have new numbness or weakness in any part of your body. These symptoms may represent a serious problem that is an emergency. Do not wait to see if the symptoms will go away. Get medical help right away. Call your local emergency services (911 in the [place] Do not drive yourself to the hospital. Summary Vertigo is the feeling that you or your surroundings are moving when they are not. The Epley maneuver is an exercise that relieves symptoms of vertigo. If the Epley maneuver is done correctly, it is considered safe. This information is not intended to replace advice given to you by your health care provider. Make sure you discuss any questions you have with your health care provider. Document Revised: 02/09/2020 Document Reviewed: 02/09/2020 Elsevier Patient Education  [place].

## 2022-02-28 NOTE — Progress Notes (Signed)
Follow Up  Assessment and Plan:  Haley Sparks was seen today for a follow up.  Diagnoses and all order for this visit:  Age-related osteoporosis without current pathological fracture Now established with Rheumatology. Not able to take Fosamax. Continue Prolia injection. Pursue a combination of weight-bearing exercises and strength training. Advised on fall prevention measures including proper lighting in all rooms, removal of area rugs and floor clutter, use of walking devices as deemed appropriate, avoidance of uneven walking surfaces. Smoking cessation and moderate alcohol consumption if applicable Consume 916 to 1000 IU of vitamin D daily with a goal vitamin D serum value of 30 ng/mL or higher. Aim for 1000 to 1200 mg of elemental calcium daily through supplements and/or dietary sources.   Barrett's esophagus with dysplasia Continue Omeprazole as directed. No suspected reflux complications (Barret/stricture). Lifestyle modification:  wt loss, avoid meals 2-3h before bedtime. Consider eliminating food triggers:  chocolate, caffeine, EtOH, acid/spicy food.   Depression, recurrent (HCC) Continue Wellbutrin. Reviewed relaxation techniques.  Sleep hygiene. Recommended mindfulness meditation and exercise.   Psychoeducation:  encouraged personality growth wand development through coping techniques and problem-solving skills. Limit/Decrease/Monitor drug/alcohol intake.     Anxiety Continue Xanax PRN only.   Not intended for every day use. Reviewed relaxation techniques.  Sleep hygiene. Recommended mindfulness meditation and exercise.   Psychoeducation:  encouraged personality growth wand development through coping techniques and problem-solving skills. Limit/Decrease/Monitor drug/alcohol intake.   Not due for refill  Attention deficit disorder (ADD) without hyperactivity Continue Adderall as directed. Not due for refill  Stress headaches Stay well hydrated. Avoid  triggers. Fioricet PRN - refilled  Hyperlipidemia Discussed lifestyle modifications. Recommended diet heavy in fruits and veggies, omega 3's. Decrease consumption of animal meats, cheeses, and dairy products. Remain active and exercise as tolerated. Continue to monitor. Check lipids/TSH  Medication management All medications discussed and reviewed in full. All questions and concerns regarding medications addressed.    Dizziness Increase Meclizine to 50 mg daily Possible referral to PT for evaluation of Epley Maneuver is s/s fail to improve.  Orders Placed This Encounter  Procedures   CBC with Differential/Platelet   COMPLETE METABOLIC PANEL WITH GFR   Lipid panel   Meds ordered this encounter  Medications   meclizine (ANTIVERT) 50 MG tablet    Sig: Take 1 tablet (50 mg total) by mouth daily.    Dispense:  30 tablet    Refill:  1    Order Specific Question:   Supervising Provider    Answer:   Unk Pinto [[id number]]   ALPRAZolam (Duanne Moron) 0.5 MG tablet    Sig: Take 1/2 to 1 tablet 2 to 3 x / day ONLY if needed for Anxiety Attack or Sleep  & please try to limit to 5 days / week to avoid addiction    Dispense:  60 tablet    Refill:  0    Order Specific Question:   Supervising Provider    Answer:   Unk Pinto [phone]     Notify office for further evaluation and treatment, questions or concerns if any reported s/s fail to improve.   The patient was advised to call back or seek an in-person evaluation if any symptoms worsen or if the condition fails to improve as anticipated.   Further disposition pending results of labs. Discussed med's effects and SE's.    I discussed the assessment and treatment plan with the patient. The patient was provided an opportunity to ask questions and all were answered. The  patient agreed with the plan and demonstrated an understanding of the instructions.  Discussed med's effects and SE's. Screening labs and tests as requested with regular  follow-up as recommended.  I provided 20 minutes of face-to-face time during this encounter including counseling, chart review, and critical decision making was preformed.   Future Appointments  Date Time Provider [place]  08/30/2022  9:00 AM Alycia Rossetti, NP [facility] None    ------------------------------------------------------------------------------------------------------------------   HPI BP 120/80   Pulse 88   Temp (!) 97.3 F (36.3 C)   Wt 135 lb (61.2 kg)   SpO2 98%   BMI 21.79 kg/m   61 y.o.female presents for follow up.    She had a recent DEXA study that revealed -2.9 T Score.  She has researched starting Fosamax but is hesitant due to the SE, as well as hx of Barrett's esophagus.  She currently takes Estradiol HRT and follows with GYN.  She was referred to Rheumatology and has received her 1st injection of Prolia.  She is due again in 06/2022.  She currently takes Wellbutrin and Xanax for control of anxiety and depression.  Well controlled on this regimen.  Tries to avoid triggers.  Requesting a refill today.  She take Adderall for treatment of ADHD.  Currently well controlled.  Requesting a refill today.    Has occasional migraine triggered by stress.  Takes Fiorcet PRN which is effective.  She tries to stay well hydrated and avoid triggers.  She continues to have dizziness.  Feel as though she is walking sideways.  Has taken Meclizine 25 mg with some improvement.  She does not follow with ENT.  BMI is Body mass index is 21.79 kg/m., she has not been working on diet and exercise.  Drinking more sodas.   Wt Readings from Last 3 Encounters:  02/28/22 135 lb (61.2 kg)  11/01/21 130 lb (59 kg)  08/29/21 139 lb 6.4 oz (63.2 kg)   Her cholesterol is not controlled.  She is not on cholesterol medication.  Her last cholesterol was: Lab Results  Component Value Date   CHOL 215 (H) 08/29/2021   HDL 86 08/29/2021   LDLCALC 109 (H) 08/29/2021   TRIG 103  08/29/2021   CHOLHDL 2.5 08/29/2021     Past Medical History:  Diagnosis Date   Allergy    Anxiety    Barrett's esophagus    Depression    GERD (gastroesophageal reflux disease)    History of bladder surgery    Liver cyst 06/07/2015   Benign appearing liver cysts noted incidentally on multiple imaging scans. No further workup recommended.    Miscarriage    twice   Peptic ulcer disease    Renal disorder    kidney stones     Allergies  Allergen Reactions   Brintellix [Vortioxetine] Nausea Only    Current Outpatient Medications on File Prior to Visit  Medication Sig   Adapalene-Benzoyl Peroxide (EPIDUO) 0.1-2.5 % gel APPLY A SMALL AMOUNT AT BEDTIME AS NEEDED   amphetamine-dextroamphetamine (ADDERALL) 20 MG tablet Take 1/2-1 tablet 2 x /day as needed for Focus & Concentration   buPROPion (WELLBUTRIN XL) 150 MG 24 hr tablet TAKE 1 TABLET BY MOUTH DAILY FOR MOOD OR FOCUS OR CONCENTRATION   butalbital-acetaminophen-caffeine (FIORICET) 50-325-40 MG tablet Take 1 tablet by mouth every 6 (six) hours as needed for headache. Limit to 2 doses/week.   denosumab (PROLIA) 60 MG/ML SOSY injection Inject 60 mg into the skin every 6 (six) months.  estradiol (ESTRACE) 2 MG tablet Take 1 tablet (2 mg total) by mouth daily.   ibuprofen (ADVIL,MOTRIN) 400 MG tablet Take 1 tablet (400 mg total) by mouth every 6 (six) hours as needed.   omeprazole (PRILOSEC) 40 MG capsule Take 40 mg by mouth daily.   tretinoin (RETIN-A) 0.1 % cream Apply topically at bedtime.   dexlansoprazole (DEXILANT) 60 MG capsule Take 60 mg by mouth daily. (Patient not taking: Reported on 11/01/2021)   No current facility-administered medications on file prior to visit.   ROS: all negative except what is noted in the HPI.   Physical Exam:  BP 120/80   Pulse 88   Temp (!) 97.3 F (36.3 C)   Wt 135 lb (61.2 kg)   SpO2 98%   BMI 21.79 kg/m   General Appearance: NAD.  Awake, conversant and cooperative. Eyes: PERRLA,  EOMs intact.  Sclera white.  Conjunctiva without erythema. Sinuses: No frontal/maxillary tenderness.  No nasal discharge. Nares patent.  ENT/Mouth: Ext aud canals clear.  Bilateral TMs w/DOL and without erythema or bulging. Hearing intact.  Posterior pharynx without swelling or exudate.  Tonsils without swelling or erythema.  Neck: Supple.  No masses, nodules or thyromegaly. Respiratory: Effort is regular with non-labored breathing. Breath sounds are equal bilaterally without rales, rhonchi, wheezing or stridor.  Cardio: RRR with no MRGs. Brisk peripheral pulses without edema.  Abdomen: Active BS in all four quadrants.  Soft and non-tender without guarding, rebound tenderness, hernias or masses. Lymphatics: Non tender without lymphadenopathy.  Musculoskeletal: Full ROM, 5/5 strength, normal ambulation.  No clubbing or cyanosis. Skin: Appropriate color for ethnicity. Warm without rashes, lesions, ecchymosis, ulcers.  Neuro: CN II-XII grossly normal. Normal muscle tone without cerebellar symptoms and intact sensation.   Psych: AO X 3,  appropriate mood and affect, insight and judgment.     Darrol Jump, NP 10:25 AM [facility]

## 2022-03-01 LAB — LIPID PANEL
Cholesterol: 206 mg/dL — ABNORMAL HIGH (ref <200)
HDL: 85 mg/dL (ref >=50)
LDL Cholesterol (Calc): 104 mg/dL (calc) — ABNORMAL HIGH
Non-HDL Cholesterol (Calc): 121 mg/dL (calc) (ref <130)
Total CHOL/HDL Ratio: 2.4 (calc) (ref <5.0)
Triglycerides: 76 mg/dL (ref <150)

## 2022-03-01 LAB — CBC WITH DIFFERENTIAL/PLATELET
Absolute Monocytes: 617 cells/uL (ref 200–950)
Basophils Absolute: 41 cells/uL (ref 0–200)
Basophils Relative: 0.8 %
Eosinophils Absolute: 204 cells/uL (ref 15–500)
Eosinophils Relative: 4 %
HCT: 35.3 % (ref 35.0–45.0)
Hemoglobin: 12 g/dL (ref 11.7–15.5)
Lymphs Abs: 2213 cells/uL (ref 850–3900)
MCH: 33.5 pg — ABNORMAL HIGH (ref 27.0–33.0)
MCHC: 34 g/dL (ref 32.0–36.0)
MCV: 98.6 fL (ref 80.0–100.0)
MPV: 9.4 fL (ref 7.5–12.5)
Monocytes Relative: 12.1 %
Neutro Abs: 2025 cells/uL (ref 1500–7800)
Neutrophils Relative %: 39.7 %
Platelets: 245 10*3/uL (ref 140–400)
RBC: 3.58 10*6/uL — ABNORMAL LOW (ref 3.80–5.10)
RDW: 11.9 % (ref 11.0–15.0)
Total Lymphocyte: 43.4 %
WBC: 5.1 10*3/uL (ref 3.8–10.8)

## 2022-03-01 LAB — COMPLETE METABOLIC PANEL WITH GFR
AG Ratio: 1.7 (calc) (ref 1.0–2.5)
ALT: 5 U/L — ABNORMAL LOW (ref 6–29)
AST: 15 U/L (ref 10–35)
Albumin: 4 g/dL (ref 3.6–5.1)
Alkaline phosphatase (APISO): 54 U/L (ref 37–153)
BUN: 16 mg/dL (ref 7–25)
CO2: 27 mmol/L (ref 20–32)
Calcium: 8.4 mg/dL — ABNORMAL LOW (ref 8.6–10.4)
Chloride: 106 mmol/L (ref 98–110)
Creat: 0.62 mg/dL (ref 0.50–1.03)
Globulin: 2.4 g/dL (calc) (ref 1.9–3.7)
Glucose, Bld: 81 mg/dL (ref 65–99)
Potassium: 4.2 mmol/L (ref 3.5–5.3)
Sodium: 139 mmol/L (ref 135–146)
Total Bilirubin: 0.3 mg/dL (ref 0.2–1.2)
Total Protein: 6.4 g/dL (ref 6.1–8.1)
eGFR: 103 mL/min/{1.73_m2} (ref >=60)

## 2022-03-05 ENCOUNTER — Encounter: Payer: Self-pay | Admitting: Internal Medicine

## 2022-05-01 ENCOUNTER — Other Ambulatory Visit: Payer: Self-pay | Admitting: Nurse Practitioner

## 2022-05-01 ENCOUNTER — Encounter: Payer: Self-pay | Admitting: Nurse Practitioner

## 2022-05-01 DIAGNOSIS — F419 Anxiety disorder, unspecified: Secondary | ICD-10-CM

## 2022-05-01 DIAGNOSIS — F339 Major depressive disorder, recurrent, unspecified: Secondary | ICD-10-CM

## 2022-05-01 DIAGNOSIS — G44209 Tension-type headache, unspecified, not intractable: Secondary | ICD-10-CM

## 2022-05-01 DIAGNOSIS — F4541 Pain disorder exclusively related to psychological factors: Secondary | ICD-10-CM

## 2022-05-01 MED ORDER — AMPHETAMINE-DEXTROAMPHETAMINE 20 MG PO TABS: ORAL_TABLET | ORAL | 0 refills | Status: DC

## 2022-05-01 MED ORDER — BUTALBITAL-APAP-CAFFEINE 50-325-40 MG PO TABS: 1.0000 | ORAL_TABLET | Freq: Four times a day (QID) | ORAL | 0 refills | Status: DC | PRN

## 2022-05-01 MED ORDER — TRETINOIN 0.1 % EX CREA: TOPICAL_CREAM | Freq: Every day | CUTANEOUS | 0 refills | Status: DC

## 2022-05-01 MED ORDER — ESTRADIOL 2 MG PO TABS: 2.0000 mg | ORAL_TABLET | Freq: Every day | ORAL | 0 refills | Status: DC

## 2022-05-01 MED ORDER — BUPROPION HCL ER (XL) 150 MG PO TB24: ORAL_TABLET | ORAL | 3 refills | Status: DC

## 2022-05-06 MED ORDER — TRETINOIN 0.1 % EX CREA: TOPICAL_CREAM | Freq: Every day | CUTANEOUS | 0 refills | Status: DC

## 2022-05-07 ENCOUNTER — Telehealth: Payer: Self-pay

## 2022-05-07 NOTE — Telephone Encounter (Signed)
Prior Authorization for Retin-A completed and submitted through CoverMyMeds.

## 2022-05-07 NOTE — Telephone Encounter (Signed)
PA was denied

## 2022-05-30 MED ORDER — ALPRAZOLAM 0.5 MG PO TABS: ORAL_TABLET | ORAL | 0 refills | Status: DC

## 2022-07-16 ENCOUNTER — Encounter: Payer: Self-pay | Admitting: Internal Medicine

## 2022-08-01 ENCOUNTER — Other Ambulatory Visit: Payer: Self-pay | Admitting: Nurse Practitioner

## 2022-08-29 NOTE — Progress Notes (Deleted)
Complete Physical  Assessment and Plan:  Encounter for Annual Physical Exam with abnormal findings Due annually  Health Maintenance reviewed Healthy lifestyle reviewed and goals set - schedule DEXA at breast center - patient to schedule follow up with GYN,   Elevated BP without dx of htn Intermittently elevated on review; borderline, patient wants to work on lifestyle Monitor blood pressure at home; call if consistently over 130/80 Continue DASH diet.   Reminder to go to the ER if any CP, SOB, nausea, dizziness, severe HA, changes vision/speech, left arm numbness and tingling and jaw pain.   Hyperlipidemia LDL goal <100 - check lipids, decrease fatty foods, increase activity, wants to avoid medications  - low risk, had normal vessels on CTA 2017 - CBC with Differential/Platelet - CMP/GFR - Lipid panel   Vitamin D deficiency - VITAMIN D 25 Hydroxy (Vit-D Deficiency, Fractures)  Medication management - Magnesium  Barrett's esophagus with dysplasia -controlled, continue the same medications, follow up Dr. Theron Arista - has scheduled  NEEDS TO STOP GOODY POWDERS, NSAIDS  Gastroesophageal reflux disease with esophagitis Continue PPI/H2 blocker, diet discussed, follow up Dr. Theron Arista - has scheduled  History of dysplastic nevus Protect skin from UV exposure; monitor closely   Continue fu derm   Depression, recurrent (HCC) In remission on wellbutrin 150 mg; continue  Lifestyle discussed: diet/exerise, sleep hygiene, stress management, hydration - TSH - amphetamine-dextroamphetamine (ADDERALL) 20 MG tablet; Take 0.5 tablets (10 mg total) by mouth 2 (two) times daily with a meal.  Dispense: 60 tablet; Refill: 0 - Wellbutrin 150 mg ER once daily in AM  Stress headaches  Will treat mood, stress management discussed discussed PT, massage, heat application - identify triggers and address at the source   Insomnia - good sleep hygiene discussed, increase day time activity, try melatonin  or benadryl, continue xanax as uses occasional only   Allergy, subsequent encounter - Allegra OTC, increase H20, allergy hygiene explained.  Screening for blood or protein in urine - Urinalysis, Routine w reflex microscopic (not at [facility]) - Microalbumin / creatinine urine ratio    BMI 22 Weight trending up from baseline, WNL but patient very frustrated Reports good lifestyle choices Check TSH, B12 Reduce stress, prioritize sleep Try keeping food log, protein/clean eating reviewed continue regular exercise Follow up with food log if weight continues to trend up without good explanation  No orders of the defined types were placed in this encounter.   Discussed med's effects and SE's. Screening labs and tests as requested with regular follow-up as recommended. Over 40 minutes of exam, counseling, chart review, and complex, high level critical decision making was performed this visit.   Future Appointments  Date Time Provider Department Center  08/30/2022  9:00 AM Raynelle Dick, NP [facility] None  09/01/2023  9:00 AM Raynelle Dick, NP [facility] None     HPI  62 y.o. female  presents for a complete physical. She has Environmental allergies; Anxiety; Depression, recurrent (HCC); Stress headaches; Hyperlipidemia LDL goal <100; History of dysplastic nevus; Vitamin D deficiency; Gastroesophageal reflux disease with esophagitis; Barrett's esophagus; DDD (degenerative disc disease), cervical; Right elbow pain; Elevated BP without diagnosis of hypertension; Osteopenia; B12 deficiency; Attention deficit disorder (ADD) without hyperactivity; Trochanteric bursitis; and Anemia on their problem list.   She is married, 2 daughters, 2 grandchildren and another on the way.  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 Lots of stress recently, daughter having family issues, worries about her.   She  is followed by GYN Dr. Rana Snare, but last in 2018; She has had TAH 1 ovary spared,  and has osteopenia, on estradiol 2 mg  for osteopenia as well as hot flashes, on bASA.  Mammo 05/17/2021 negative at breast center. She is agreeable to GYN follow up this year.   Hx of recurrent depression, recently has remained on wellbutin XR 150 mg with good results. She has hx of ADD, prescribed adderall 20 mg IR, report takes 1/2 tab adderall once daily, rarely second tab in hour if running late, takes Mon-Fri without SE.   Has xanax but rarely uses during the day, typically takes to sleep at night, ongoing discussions to limit use. Has tried gabapentin before and had too much fogginess.   She has headaches associated with stress/neck pain; uses Excedrin migraine; admits occasional goody powder use. Occasionally fioricet.  She has neck pain, arthritis, has seen neuro for shots, hasn't been recently.   Has hx of GERD with Barrett's esophagus, hx of ulcers, followed by Dr. Theron Arista. She is on dexilant 60 mg daily. Has GI follow up tomorrow.   She has history of dysplastic nevus, and follows with Dr. Nicholas Lose.   BMI is There is no height or weight on file to calculate BMI., she has been working on diet and exercise, walking, going to the gym regularly, helps with mood. Frustrated with recent weight gain, feels like making good choices overall/no big changes.  Has reduced alcohol intake from 8/week to once per week.  Wt Readings from Last 3 Encounters:  02/28/22 135 lb (61.2 kg)  11/01/21 130 lb (59 kg)  08/29/21 139 lb 6.4 oz (63.2 kg)   Her blood pressure has been controlled at home, today their BP is   She does workout, walks daily. She denies chest pain, shortness of breath, dizziness.   She had CTA in 2017 which showed normal caliber arteries and no atherosclerosis  She is not on cholesterol medication and denies myalgias. Her cholesterol is not at goal. The cholesterol last visit was:   Lab Results  Component Value Date   CHOL 206 (H) 02/28/2022   HDL 85 02/28/2022   LDLCALC 104 (H)  02/28/2022   TRIG 76 02/28/2022   CHOLHDL 2.4 02/28/2022   Last Z6X in the office was:  Lab Results  Component Value Date   HGBA1C 5.0 08/29/2021   Lab Results  Component Value Date   EGFR 103 02/28/2022   Patient is not currently on Vitamin D supplement.  Lab Results  Component Value Date   VD25OH 30 08/29/2021     She is not currently on B12 supplement -  Lab Results  Component Value Date   VITAMINB12 274 08/29/2021     Current Medications:  Current Outpatient Medications on File Prior to Visit  Medication Sig Dispense Refill   Adapalene-Benzoyl Peroxide (EPIDUO) 0.1-2.5 % gel APPLY A SMALL AMOUNT AT BEDTIME AS NEEDED 45 g 3   ALPRAZolam (XANAX) 0.5 MG tablet Take 1/2 to 1 tablet 2 to 3 x / day ONLY if needed for Anxiety Attack or Sleep  & please try to limit to 5 days / week to avoid addiction 60 tablet 0   amphetamine-dextroamphetamine (ADDERALL) 20 MG tablet Take 1/2-1 tablet 2 x /day as needed for Focus & Concentration 60 tablet 0   buPROPion (WELLBUTRIN XL) 150 MG 24 hr tablet TAKE 1 TABLET BY MOUTH DAILY FOR MOOD OR FOCUS OR CONCENTRATION 90 tablet 3   butalbital-acetaminophen-caffeine (FIORICET) 50-325-40 MG tablet  Take 1 tablet by mouth every 6 (six) hours as needed for headache. Limit to 2 doses/week. 30 tablet 0   denosumab (PROLIA) 60 MG/ML SOSY injection Inject 60 mg into the skin every 6 (six) months.     dexlansoprazole (DEXILANT) 60 MG capsule Take 60 mg by mouth daily. (Patient not taking: Reported on 11/01/2021)     estradiol (ESTRACE) 2 MG tablet TAKE 1 TABLET(2 MG) BY MOUTH DAILY 90 tablet 0   ibuprofen (ADVIL,MOTRIN) 400 MG tablet Take 1 tablet (400 mg total) by mouth every 6 (six) hours as needed. 30 tablet 0   meclizine (ANTIVERT) 50 MG tablet Take 1 tablet (50 mg total) by mouth daily. 30 tablet 1   omeprazole (PRILOSEC) 40 MG capsule Take 40 mg by mouth daily.     tretinoin (RETIN-A) 0.1 % cream Apply topically at bedtime. 45 g 0   No current  facility-administered medications on file prior to visit.   Allergies:  Allergies  Allergen Reactions   Brintellix [Vortioxetine] Nausea Only   Medical History:  Shehas Environmental allergies; Anxiety; Depression, recurrent (HCC); Stress headaches; Hyperlipidemia LDL goal <100; History of dysplastic nevus; Vitamin D deficiency; Gastroesophageal reflux disease with esophagitis; Barrett's esophagus; DDD (degenerative disc disease), cervical; Right elbow pain; Elevated BP without diagnosis of hypertension; Osteopenia; B12 deficiency; Attention deficit disorder (ADD) without hyperactivity; Trochanteric bursitis; and Anemia on their problem list.   Health Maintenance:   Immunization History  Administered Date(s) Administered   Influenza Inj Mdck Quad With Preservative 02/24/2020   Influenza Split 12/25/2012, 12/20/2014   Influenza,inj,Quad PF,6+ Mos 02/04/2019   Influenza-Unspecified 12/23/2016   PFIZER(Purple Top)SARS-COV-2 Vaccination 08/03/2019, 08/24/2019   Tdap 09/04/2012   Health Maintenance  Topic Date Due   Zoster Vaccines- Shingrix (1 of 2) Never done   PAP SMEAR-Modifier  01/06/2017   COVID-19 Vaccine (3 - Pfizer risk series) 09/21/2019   DTaP/Tdap/Td (2 - Td or Tdap) 09/05/2022   INFLUENZA VACCINE  10/24/2022   MAMMOGRAM  05/18/2023   Colonoscopy  01/02/2025   HPV VACCINES  Aged Out   Hepatitis C Screening  Discontinued   HIV Screening  Discontinued   Shingrix: discussed, wants to hold off Covid 19: 2/2, pfizer, will send card  LMP TAH, cervix removed  Pap: 2018 Dr. Rana Snare, will schedule this year MGM: 04/2021, breast center  DEXA: 2018 osteopenia Dr. Rana Snare but wants to get next at breast center - order placed  Colonoscopy: Aug 2016 with Dr. Noe Gens, polyps, [facility] [place], 5 year recall, overdue, has appointment tomorrow  EGD: 03/2018, + gastritis/ulcer still, - on dexilant, 2 year follow up, has follow up tomorrow   Vision: Dr. Jimmey Ralph, [facility], 12/2018,  overdue - has next month  Dental: Dr. Jon Billings, last 07/2021, goes q47m  Derm: Dr. Nicholas Lose, derm, will schedule with new provider   Patient Care Team: Lucky Cowboy, MD as PCP - General (Internal Medicine) Earl Gala, MD as Referring Physician (Orthopedic Surgery) Marcene Corning, MD as Consulting Physician (Orthopedic Surgery) Arlice Colt, MD (Unknown Physician Specialty)  Surgical History:  She  has a past surgical history that includes Abdominal hysterectomy (2004); Cesarean section; Bladder suspension; and Tonsillectomy. Family History:  She had a family history includes Asthma in her paternal grandmother; COPD in her maternal grandmother; Heart disease in her father and paternal grandfather; Hyperlipidemia in her father; Hypertension in her father and mother; Pulmonary embolism in her maternal grandmother. Social History:  She  reports that she has never smoked. She has been  exposed to tobacco smoke. She has never used smokeless tobacco. She reports current alcohol use of about 3.0 standard drinks of alcohol per week. She reports that she does not use drugs.  Review of Systems: Review of Systems  Constitutional: Negative.  Negative for malaise/fatigue and weight loss.  HENT: Negative.  Negative for hearing loss and tinnitus.   Eyes: Negative.  Negative for blurred vision and double vision.  Respiratory: Negative.  Negative for cough, shortness of breath and wheezing.   Cardiovascular: Negative.  Negative for chest pain, palpitations, orthopnea, claudication and leg swelling.  Gastrointestinal:  Positive for heartburn (intermittent, after trigger foods, takes tums). Negative for abdominal pain, blood in stool, constipation, diarrhea, melena, nausea and vomiting.  Genitourinary: Negative.  Negative for dysuria, flank pain, frequency, hematuria and urgency.  Musculoskeletal:  Positive for neck pain (at night, some intermittent right radicular). Negative for joint pain and myalgias.   Skin: Negative.  Negative for rash.  Neurological:  Positive for headaches (improved, a few/week). Negative for dizziness, tingling, sensory change and weakness.  Endo/Heme/Allergies: Negative.  Negative for polydipsia.  Psychiatric/Behavioral:  Negative for depression, hallucinations, memory loss, substance abuse and suicidal ideas. The patient is not nervous/anxious and does not have insomnia.   All other systems reviewed and are negative.   Physical Exam: Estimated body mass index is 21.79 kg/m as calculated from the following:   Height as of 11/01/21: 5\' 6"  (1.676 m).   Weight as of 02/28/22: 135 lb (61.2 kg). There were no vitals taken for this visit. General Appearance: Well nourished, in no apparent distress.  Eyes: PERRLA, EOMs, conjunctiva no swelling or erythema  Sinuses: No Frontal/maxillary tenderness  ENT/Mouth: Ext aud canals clear, normal light reflex with TMs without erythema, bulging. Good dentition. No erythema, swelling, or exudate on post pharynx. Tonsils not swollen or erythematous. Hearing normal. + TMJ Neck: Supple, thyroid normal. No bruits  Respiratory: Respiratory effort normal, BS equal bilaterally without rales, rhonchi, wheezing or stridor.  Cardio: RRR without murmurs, rubs or gallops. Brisk peripheral pulses without edema.  Chest: symmetric, with normal excursions and percussion.  Breasts: defer to GYN Abdomen: Soft, nontender, no guarding, rebound, hernias, masses, or organomegaly.  Lymphatics: Non tender without lymphadenopathy.  Genitourinary: defer to GYN Musculoskeletal: Full ROM all peripheral extremities,5/5 strength, and normal gait., normal distal neuro. + left lateral hip tenderness over greater trochanter Skin: Warm, dry without rashes, lesions, ecchymosis. Neuro: Cranial nerves intact, reflexes equal bilaterally. Normal muscle tone, no cerebellar symptoms. Sensation intact.  Psych: Awake and oriented X 3, normal affect, Insight and Judgment  appropriate.   EKG: patient declined, had WNL in ED 03/2021  Raynelle Dick, NP-C 11:59 AM [facility]

## 2022-08-30 ENCOUNTER — Encounter: Payer: Self-pay | Admitting: Nurse Practitioner

## 2022-08-30 DIAGNOSIS — G4700 Insomnia, unspecified: Secondary | ICD-10-CM

## 2022-08-30 DIAGNOSIS — F339 Major depressive disorder, recurrent, unspecified: Secondary | ICD-10-CM

## 2022-08-30 DIAGNOSIS — E559 Vitamin D deficiency, unspecified: Secondary | ICD-10-CM

## 2022-08-30 DIAGNOSIS — K22719 Barrett's esophagus with dysplasia, unspecified: Secondary | ICD-10-CM

## 2022-08-30 DIAGNOSIS — Z0001 Encounter for general adult medical examination with abnormal findings: Secondary | ICD-10-CM

## 2022-08-30 DIAGNOSIS — Z1389 Encounter for screening for other disorder: Secondary | ICD-10-CM

## 2022-08-30 DIAGNOSIS — F4541 Pain disorder exclusively related to psychological factors: Secondary | ICD-10-CM

## 2022-08-30 DIAGNOSIS — K2100 Gastro-esophageal reflux disease with esophagitis, without bleeding: Secondary | ICD-10-CM

## 2022-08-30 DIAGNOSIS — Z79899 Other long term (current) drug therapy: Secondary | ICD-10-CM

## 2022-08-30 DIAGNOSIS — Z136 Encounter for screening for cardiovascular disorders: Secondary | ICD-10-CM

## 2022-08-30 DIAGNOSIS — Z86018 Personal history of other benign neoplasm: Secondary | ICD-10-CM

## 2022-08-30 DIAGNOSIS — Z9109 Other allergy status, other than to drugs and biological substances: Secondary | ICD-10-CM

## 2022-08-30 DIAGNOSIS — R030 Elevated blood-pressure reading, without diagnosis of hypertension: Secondary | ICD-10-CM

## 2022-08-30 DIAGNOSIS — E782 Mixed hyperlipidemia: Secondary | ICD-10-CM

## 2022-10-15 ENCOUNTER — Other Ambulatory Visit: Payer: Self-pay | Admitting: Nurse Practitioner

## 2022-10-15 ENCOUNTER — Encounter: Payer: Self-pay | Admitting: Nurse Practitioner

## 2022-10-15 DIAGNOSIS — F419 Anxiety disorder, unspecified: Secondary | ICD-10-CM

## 2022-10-15 DIAGNOSIS — F339 Major depressive disorder, recurrent, unspecified: Secondary | ICD-10-CM

## 2022-10-16 MED ORDER — AMPHETAMINE-DEXTROAMPHETAMINE 20 MG PO TABS: ORAL_TABLET | ORAL | 0 refills | Status: DC

## 2022-10-18 MED ORDER — ALPRAZOLAM 0.5 MG PO TABS: ORAL_TABLET | ORAL | 0 refills | Status: DC

## 2022-11-08 ENCOUNTER — Other Ambulatory Visit: Payer: Self-pay | Admitting: Nurse Practitioner

## 2023-01-15 ENCOUNTER — Encounter: Payer: Self-pay | Admitting: Internal Medicine

## 2023-02-03 ENCOUNTER — Other Ambulatory Visit: Payer: Self-pay | Admitting: Nurse Practitioner

## 2023-02-25 ENCOUNTER — Other Ambulatory Visit: Payer: Self-pay

## 2023-02-25 ENCOUNTER — Encounter: Payer: Self-pay | Admitting: Nurse Practitioner

## 2023-02-25 ENCOUNTER — Ambulatory Visit: Payer: BC Managed Care – PPO | Admitting: Nurse Practitioner

## 2023-02-25 VITALS — BP 134/74 | HR 87 | Temp 97.9°F | Ht 64.0 in | Wt 129.0 lb

## 2023-02-25 DIAGNOSIS — J0100 Acute maxillary sinusitis, unspecified: Secondary | ICD-10-CM

## 2023-02-25 DIAGNOSIS — F339 Major depressive disorder, recurrent, unspecified: Secondary | ICD-10-CM

## 2023-02-25 MED ORDER — DEXAMETHASONE SODIUM PHOSPHATE 10 MG/ML IJ SOLN
10.0000 mg | Freq: Once | INTRAMUSCULAR | Status: AC
  Administered 2023-02-25: 10 mg via INTRAMUSCULAR

## 2023-02-25 MED ORDER — AZITHROMYCIN 250 MG PO TABS: ORAL_TABLET | ORAL | 1 refills | Status: DC

## 2023-02-25 NOTE — Progress Notes (Signed)
Assessment and Plan:  Haley Sparks was seen today for acute visit.  Diagnoses and all orders for this visit:  Depression, recurrent (HCC) Continue medications and monitor symptoms Overdue for CPE and is to schedule ASAP  Acute non-recurrent maxillary sinusitis Zithromax 2 tabs day 1 and then 1 tab daily the next 4 days Start zyrtec or allegra daily for 2 weeks Dexamethasone injection given in the office Use Mucinex for thinning of nasal mucus -     azithromycin (ZITHROMAX) 250 MG tablet; Take 2 tablets (500 mg) on  Day 1,  followed by 1 tablet (250 mg) once daily on Days 2 through 5. -     dexamethasone (DECADRON) injection 10 mg       Further disposition pending results of labs. Discussed med's effects and SE's.   Over 30 minutes of exam, counseling, chart review, and critical decision making was performed.    ------------------------------------------------------------------------------------------------------------------   HPI BP 134/74   Pulse 87   Temp 97.9 F (36.6 C)   Ht 5\' 4"  (1.626 m)   Wt 129 lb (58.5 kg)   SpO2 98%   BMI 22.14 kg/m  62 y.o.female presents for complaints of sinus congestion, ears feel full and sinus pressure. Denies sinus drainage, cough, body aches, nausea, vomiting , fever, sore throat and diarrhea. Symptoms began 3 days ago. Has not tried any other over the counter medications.     BP well controlled without medication BP Readings from Last 3 Encounters:  02/25/23 134/74  02/28/22 120/80  11/01/21 114/78  Denies headaches, chest pain, shortness of breath and dizziness   BMI is Body mass index is 22.14 kg/m., she has been working on diet and exercise. Wt Readings from Last 3 Encounters:  02/25/23 129 lb (58.5 kg)  02/28/22 135 lb (61.2 kg)  11/01/21 130 lb (59 kg)   She is currently on Wellbutrin for depression and symptoms are currently well controlled. She uses alprazolam sparingly for insomnia/anxiety.  Last filled 10/18/22  Alprazolam 0.5 mg #60 She is also on adderall for ADD with last prescription Adderall 20 mg #180 on 10/18/22  Past Medical History:  Diagnosis Date   Allergy    Anxiety    Barrett's esophagus    Depression    GERD (gastroesophageal reflux disease)    History of bladder surgery    Liver cyst 06/07/2015   Benign appearing liver cysts noted incidentally on multiple imaging scans. No further workup recommended.    Miscarriage    twice   Peptic ulcer disease    Renal disorder    kidney stones     Allergies  Allergen Reactions   Brintellix [Vortioxetine] Nausea Only    Current Outpatient Medications on File Prior to Visit  Medication Sig   Adapalene-Benzoyl Peroxide (EPIDUO) 0.1-2.5 % gel APPLY A SMALL AMOUNT AT BEDTIME AS NEEDED   ALPRAZolam (XANAX) 0.5 MG tablet Take 1/2 to 1 tablet 2 to 3 x / day ONLY if needed for Anxiety Attack or Sleep  & please try to limit to 5 days / week to avoid addiction   amphetamine-dextroamphetamine (ADDERALL) 20 MG tablet Take   1/2-1 tablet   2 x / day  as needed for ADD, Focus & Concentration                                                         /  TAKE                                         BY                                                 MOUTH   buPROPion (WELLBUTRIN XL) 150 MG 24 hr tablet TAKE 1 TABLET BY MOUTH DAILY FOR MOOD OR FOCUS OR CONCENTRATION   butalbital-acetaminophen-caffeine (FIORICET) 50-325-40 MG tablet Take 1 tablet by mouth every 6 (six) hours as needed for headache. Limit to 2 doses/week.   denosumab (PROLIA) 60 MG/ML SOSY injection Inject 60 mg into the skin every 6 (six) months.   estradiol (ESTRACE) 2 MG tablet TAKE 1 TABLET(2 MG) BY MOUTH DAILY   ibuprofen (ADVIL,MOTRIN) 400 MG tablet Take 1 tablet (400 mg total) by mouth every 6 (six) hours as needed.   meclizine (ANTIVERT) 50 MG tablet Take 1 tablet (50 mg total) by mouth daily.   omeprazole (PRILOSEC) 40 MG  capsule Take 40 mg by mouth daily.   tretinoin (RETIN-A) 0.1 % cream Apply topically at bedtime.   dexlansoprazole (DEXILANT) 60 MG capsule Take 60 mg by mouth daily. (Patient not taking: Reported on 11/01/2021)   No current facility-administered medications on file prior to visit.    ROS: all negative except above.   Physical Exam:  BP 134/74   Pulse 87   Temp 97.9 F (36.6 C)   Ht 5\' 4"  (1.626 m)   Wt 129 lb (58.5 kg)   SpO2 98%   BMI 22.14 kg/m   General Appearance: Well nourished, in no apparent distress. Eyes: PERRLA, EOMs, conjunctiva no swelling or erythema Sinuses: + maxillary tenderness ENT/Mouth:Left Ext aud canals blood on medial side, TMs without erythema, bulging. No erythema, swelling, or exudate on post pharynx.  Hearing normal.  Neck: Supple, thyroid normal.  Respiratory: Respiratory effort normal, BS equal bilaterally without rales, rhonchi, wheezing or stridor.  Cardio: RRR with no MRGs. Brisk peripheral pulses without edema.  Abdomen: Soft, + BS.  Non tender, no guarding, rebound, hernias, masses. Lymphatics: + cervical adenopathy Musculoskeletal: Full ROM, 5/5 strength, normal gait.  Skin: Warm, dry without rashes, lesions, ecchymosis.  Neuro: Cranial nerves intact. Normal muscle tone, no cerebellar symptoms. Sensation intact.  Psych: Awake and oriented X 3, normal affect, Insight and Judgment appropriate.     Raynelle Dick, NP 1:47 PM [facility]

## 2023-02-25 NOTE — Patient Instructions (Addendum)
Zithromax 2 tabs day 1 and then 1 tab daily the next 4 days  Start zyrtec or allegra daily for 2 weeks  Dexamethasone injection given in the office  Use Mucinex for thinning of nasal mucus  Sinus Infection, Adult A sinus infection is soreness and swelling (inflammation) of your sinuses. Sinuses are hollow spaces in the bones around your face. They are located: Around your eyes. In the middle of your forehead. Behind your nose. In your cheekbones. Your sinuses and nasal passages are lined with a fluid called mucus. Mucus drains out of your sinuses. Swelling can trap mucus in your sinuses. This lets germs (bacteria, virus, or fungus) grow, which leads to infection. Most of the time, this condition is caused by a virus. What are the causes? Allergies. Asthma. Germs. Things that block your nose or sinuses. Growths in the nose (nasal polyps). Chemicals or irritants in the air. A fungus. This is rare. What increases the risk? Having a weak body defense system (immune system). Doing a lot of swimming or diving. Using nasal sprays too much. Smoking. What are the signs or symptoms? The main symptoms of this condition are pain and a feeling of pressure around the sinuses. Other symptoms include: Stuffy nose (congestion). This may make it hard to breathe through your nose. Runny nose (drainage). Soreness, swelling, and warmth in the sinuses. A cough that may get worse at night. Being unable to smell and taste. Mucus that collects in the throat or the back of the nose (postnasal drip). This may cause a sore throat or bad breath. Being very tired (fatigued). A fever. How is this diagnosed? Your symptoms. Your medical history. A physical exam. Tests to find out if your condition is short-term (acute) or long-term (chronic). Your doctor may: Check your nose for growths (polyps). Check your sinuses using a tool that has a light on one end (endoscope). Check for allergies or germs. Do  imaging tests, such as an MRI or CT scan. How is this treated? Treatment for this condition depends on the cause and whether it is short-term or long-term. If caused by a virus, your symptoms should go away on their own within 10 days. You may be given medicines to relieve symptoms. They include: Medicines that shrink swollen tissue in the nose. A spray that treats swelling of the nostrils. Rinses that help get rid of thick mucus in your nose (nasal saline washes). Medicines that treat allergies (antihistamines). Over-the-counter pain relievers. If caused by bacteria, your doctor may wait to see if you will get better without treatment. You may be given antibiotic medicine if you have: A very bad infection. A weak body defense system. If caused by growths in the nose, surgery may be needed. Follow these instructions at home: Medicines Take, use, or apply over-the-counter and prescription medicines only as told by your doctor. These may include nasal sprays. If you were prescribed an antibiotic medicine, take it as told by your doctor. Do not stop taking it even if you start to feel better. Hydrate and humidify  Drink enough water to keep your pee (urine) pale yellow. Use a cool mist humidifier to keep the humidity level in your home above 50%. Breathe in steam for 10-15 minutes, 3-4 times a day, or as told by your doctor. You can do this in the bathroom while a hot shower is running. Try not to spend time in cool or dry air. Rest Rest as much as you can. Sleep with your head raised (  elevated). Make sure you get enough sleep each night. General instructions  Put a warm, moist washcloth on your face 3-4 times a day, or as often as told by your doctor. Use nasal saline washes as often as told by your doctor. Wash your hands often with soap and water. If you cannot use soap and water, use hand sanitizer. Do not smoke. Avoid being around people who are smoking (secondhand smoke). Keep all  follow-up visits. Contact a doctor if: You have a fever. Your symptoms get worse. Your symptoms do not get better within 10 days. Get help right away if: You have a very bad headache. You cannot stop vomiting. You have very bad pain or swelling around your face or eyes. You have trouble seeing. You feel confused. Your neck is stiff. You have trouble breathing. These symptoms may be an emergency. Get help right away. Call 911. Do not wait to see if the symptoms will go away. Do not drive yourself to the hospital. Summary A sinus infection is swelling of your sinuses. Sinuses are hollow spaces in the bones around your face. This condition is caused by tissues in your nose that become inflamed or swollen. This traps germs. These can lead to infection. If you were prescribed an antibiotic medicine, take it as told by your doctor. Do not stop taking it even if you start to feel better. Keep all follow-up visits. This information is not intended to replace advice given to you by your health care provider. Make sure you discuss any questions you have with your health care provider. Document Revised: 02/13/2021 Document Reviewed: 02/13/2021 Elsevier Patient Education  2024 [employer].

## 2023-03-10 ENCOUNTER — Ambulatory Visit: Payer: BC Managed Care – PPO | Admitting: Nurse Practitioner

## 2023-03-10 ENCOUNTER — Encounter: Payer: Self-pay | Admitting: Nurse Practitioner

## 2023-03-10 VITALS — BP 122/78 | HR 80 | Temp 98.0°F | Ht 64.5 in | Wt 131.8 lb

## 2023-03-10 DIAGNOSIS — Z1329 Encounter for screening for other suspected endocrine disorder: Secondary | ICD-10-CM

## 2023-03-10 DIAGNOSIS — R030 Elevated blood-pressure reading, without diagnosis of hypertension: Secondary | ICD-10-CM

## 2023-03-10 DIAGNOSIS — Z6823 Body mass index (BMI) 23.0-23.9, adult: Secondary | ICD-10-CM

## 2023-03-10 DIAGNOSIS — Z130 Encounter for screening for diseases of the blood and blood-forming organs and certain disorders involving the immune mechanism: Secondary | ICD-10-CM

## 2023-03-10 DIAGNOSIS — Z0000 Encounter for general adult medical examination without abnormal findings: Secondary | ICD-10-CM

## 2023-03-10 DIAGNOSIS — Z136 Encounter for screening for cardiovascular disorders: Secondary | ICD-10-CM

## 2023-03-10 DIAGNOSIS — E559 Vitamin D deficiency, unspecified: Secondary | ICD-10-CM | POA: Diagnosis not present

## 2023-03-10 DIAGNOSIS — Z13220 Encounter for screening for lipoid disorders: Secondary | ICD-10-CM

## 2023-03-10 DIAGNOSIS — Z1389 Encounter for screening for other disorder: Secondary | ICD-10-CM | POA: Diagnosis not present

## 2023-03-10 DIAGNOSIS — Z131 Encounter for screening for diabetes mellitus: Secondary | ICD-10-CM | POA: Diagnosis not present

## 2023-03-10 DIAGNOSIS — G44209 Tension-type headache, unspecified, not intractable: Secondary | ICD-10-CM

## 2023-03-10 DIAGNOSIS — E785 Hyperlipidemia, unspecified: Secondary | ICD-10-CM

## 2023-03-10 DIAGNOSIS — F5102 Adjustment insomnia: Secondary | ICD-10-CM

## 2023-03-10 DIAGNOSIS — K2100 Gastro-esophageal reflux disease with esophagitis, without bleeding: Secondary | ICD-10-CM

## 2023-03-10 DIAGNOSIS — Z79899 Other long term (current) drug therapy: Secondary | ICD-10-CM

## 2023-03-10 DIAGNOSIS — F419 Anxiety disorder, unspecified: Secondary | ICD-10-CM

## 2023-03-10 DIAGNOSIS — Z9109 Other allergy status, other than to drugs and biological substances: Secondary | ICD-10-CM

## 2023-03-10 DIAGNOSIS — D649 Anemia, unspecified: Secondary | ICD-10-CM

## 2023-03-10 DIAGNOSIS — K22719 Barrett's esophagus with dysplasia, unspecified: Secondary | ICD-10-CM

## 2023-03-10 DIAGNOSIS — F339 Major depressive disorder, recurrent, unspecified: Secondary | ICD-10-CM

## 2023-03-10 DIAGNOSIS — Z0001 Encounter for general adult medical examination with abnormal findings: Secondary | ICD-10-CM

## 2023-03-10 DIAGNOSIS — Z86018 Personal history of other benign neoplasm: Secondary | ICD-10-CM

## 2023-03-10 DIAGNOSIS — F4541 Pain disorder exclusively related to psychological factors: Secondary | ICD-10-CM

## 2023-03-10 DIAGNOSIS — R232 Flushing: Secondary | ICD-10-CM

## 2023-03-10 MED ORDER — BUPROPION HCL ER (XL) 150 MG PO TB24: 150.0000 mg | ORAL_TABLET | Freq: Two times a day (BID) | ORAL | 3 refills | Status: AC

## 2023-03-10 MED ORDER — AMPHETAMINE-DEXTROAMPHETAMINE 20 MG PO TABS: ORAL_TABLET | ORAL | 0 refills | Status: AC

## 2023-03-10 MED ORDER — BUTALBITAL-APAP-CAFFEINE 50-325-40 MG PO TABS: 1.0000 | ORAL_TABLET | Freq: Four times a day (QID) | ORAL | 0 refills | Status: AC | PRN

## 2023-03-10 MED ORDER — TRETINOIN 0.1 % EX CREA: TOPICAL_CREAM | Freq: Every day | CUTANEOUS | 0 refills | Status: DC

## 2023-03-10 NOTE — Patient Instructions (Signed)
Black Cohosh Oral Dosage Forms What is this medication? BLACK COHOSH (blak KOH hosh) may relieve the symptoms of menopause. The FDA has not evaluated this supplement for any medical use. It may contain ingredients not listed. Discuss all supplements you are taking with your care team. They can provide you with important safety information. This medicine may be used for other purposes; ask your health care provider or pharmacist if you have questions. What should I tell my care team before I take this medication? They need to know if you have any of these conditions: Breast cancer Cervical, ovarian or uterine cancer High blood pressure Infertility Liver disease Menstrual changes or irregular periods Unusual vaginal or uterine bleeding An unusual or allergic reaction to black cohosh, soybeans, tartrazine dye (yellow dye number 5), other supplements, foods, dyes, or preservatives Pregnant or trying to get pregnant Breast-feeding How should I use this medication? Take this herb by mouth with a glass of water. Follow the directions on the package labeling, or talk to your care team. Do not use for longer than 6 months without the advice of your care team. Do not use if you are pregnant or breast-feeding. Talk to your care team. This supplement is not for use in children under the age of 18 years. Overdosage: If you think you have taken too much of this medicine contact a poison control center or emergency room at once. NOTE: This medicine is only for you. Do not share this medicine with others. What if I miss a dose? If you miss a dose, take it as soon as you can. If it is almost time for your next dose, take only that dose. Do not take double or extra doses. What may interact with this medication? Atorvastatin Cisplatin Fertility treatments This list may not describe all possible interactions. Give your health care provider a list of all the medicines, herbs, non-prescription drugs, or dietary  supplements you use. Also tell them if you smoke, drink alcohol, or use illegal drugs. Some items may interact with your medicine. What should I watch for while using this medication? Since this supplement is derived from a plant, allergic reactions are possible. Stop using this supplement if you develop a rash. You may need to see your care team, or inform them that this occurred. Report any unusual side effects promptly. If you are taking this supplement for menstrual or menopausal symptoms, visit your care team for regular checks on your progress. You should have a complete check-up every 6 months. You will need a regular breast and pelvic exam while on this therapy. Follow the advice of your care team. Women should inform their care team if they wish to become pregnant or think they might be pregnant. If you have any reason to think you are pregnant, stop taking this supplement at once and contact your care team. Herbal or dietary supplements are not regulated like medications. Rigid quality control standards are not required for dietary supplements. The purity and strength of these products can vary. The safety and effect of this dietary supplement for a certain disease or illness is not well known. This product is not intended to diagnose, treat, cure or prevent any disease. The Food and Drug Administration suggests the following to help consumers protect themselves: Always read product labels and follow directions. Natural does not mean a product is safe for humans to take. Look for products that include USP after the ingredient name. This means that the manufacturer followed the standards of  the [place] Pharmacopoeia. Supplements made or sold by a nationally known food or drug company are more likely to be made under tight controls. You can write to the company for more information about how the product was made. What side effects may I notice from receiving this medication? Side effects that you  should report to your care team as soon as possible: Allergic reactions--skin rash, itching, hives, swelling of the face, lips, tongue, or throat Liver injury--right upper belly pain, loss of appetite, nausea, light-colored stool, dark yellow or brown urine, yellowing skin or eyes, unusual weakness or fatigue Side effects that usually do not require medical attention (report to your care team if they continue or are bothersome): Breast pain or tenderness Irregular menstrual cycles or spotting Nausea Upset stomach This list may not describe all possible side effects. Call your doctor for medical advice about side effects. You may report side effects to FDA at 1-800-FDA-1088. Where should I keep my medication? Keep out of the reach of children and pets. Store at room temperature between 15 and 30 degrees C (59 and 86 degrees F). Get rid of any unused supplement after the expiration date. NOTE: This sheet is a summary. It may not cover all possible information. If you have questions about this medicine, talk to your doctor, pharmacist, or health care provider.  2024 Elsevier/Gold Standard (2021-05-09 00:00:00)   Trazodone Tablets What is this medication? TRAZODONE (TRAZ oh done) treats depression. It increases the amount of serotonin in the brain, a hormone that helps regulate mood. This medicine may be used for other purposes; ask your health care provider or pharmacist if you have questions. COMMON BRAND NAME(S): Desyrel What should I tell my care team before I take this medication? They need to know if you have any of these conditions: Bipolar disorder Bleeding disorder Glaucoma Heart disease, or previous heart attack Irregular heartbeat or rhythm Kidney disease Liver disease Low levels of sodium in the blood Suicidal thoughts, plans, or attempt by you or a family member An unusual or allergic reaction to trazodone, other medications, foods, dyes, or preservatives Pregnant or trying  to get pregnant Breastfeeding How should I use this medication? Take this medication by mouth with a glass of water. Take it as directed on the prescription label at the same time every day. Take this medication shortly after a meal or a light snack. Keep taking this medication unless your care team tells you to stop. Stopping it too quickly can cause serious side effects. It can also make your condition worse. A special MedGuide will be given to you by the pharmacist with each prescription and refill. Be sure to read this information carefully each time. Talk to your care team about the use of this medication in children. Special care may be needed. Overdosage: If you think you have taken too much of this medicine contact a poison control center or emergency room at once. NOTE: This medicine is only for you. Do not share this medicine with others. What if I miss a dose? If you miss a dose, take it as soon as you can. If it is almost time for your next dose, take only that dose. Do not take double or extra doses. What may interact with this medication? Do not take this medication with any of the following: Certain medications for fungal infections, such as fluconazole, itraconazole, ketoconazole, posaconazole, voriconazole Cisapride Dronedarone Linezolid MAOIs, such as Carbex, Eldepryl, Marplan, Nardil, and Parnate Mesoridazine Methylene blue (injected into a  vein) Pimozide Saquinavir Thioridazine This medication may also interact with the following: Alcohol Antiviral medications for HIV or AIDS Aspirin and aspirin-like medications Barbiturates, such as phenobarbital Certain medications for blood pressure, heart disease, irregular heart beat Certain medications for mental health conditions Certain medications for migraine headache, such as almotriptan, eletriptan, frovatriptan, naratriptan, rizatriptan, sumatriptan, zolmitriptan Certain medications for seizures, such as carbamazepine  and phenytoin Certain medications for sleep Certain medications that treat or prevent blood clots, such as dalteparin, enoxaparin, warfarin Digoxin Fentanyl Lithium NSAIDS, medications for pain and inflammation, such as ibuprofen or naproxen Other medications that cause heart rhythm changes Rasagiline Supplements, such as St. John's wort, kava kava, valerian Tramadol Tryptophan This list may not describe all possible interactions. Give your health care provider a list of all the medicines, herbs, non-prescription drugs, or dietary supplements you use. Also tell them if you smoke, drink alcohol, or use illegal drugs. Some items may interact with your medicine. What should I watch for while using this medication? Visit your care team for regular checks on your progress. Tell your care team if your symptoms do not start to get better or if they get worse. Because it may take several weeks to see the full effects of this medication, it is important to continue your treatment as prescribed by your care team. Watch for new or worsening thoughts of suicide or depression. This includes sudden changes in mood, behaviors, or thoughts. These changes can happen at any time but are more common in the beginning of treatment or after a change in dose. Call your care team right away if you experience these thoughts or worsening depression. This medication may cause mood and behavior changes, such as anxiety, nervousness, irritability, hostility, restlessness, excitability, hyperactivity, or trouble sleeping. These changes can happen at any time but are more common in the beginning of treatment or after a change in dose. Call your care team right away if you notice any of these symptoms. This medication may affect your coordination, reaction time, or judgment. Do not drive or operate machinery until you know how this medication affects you. Sit up or stand slowly to reduce the risk of dizzy or fainting spells.  Drinking alcohol with this medication can increase the risk of these side effects. This medication may cause dry eyes and blurred vision. If you wear contact lenses you may feel some discomfort. Lubricating drops may help. See your care team if the problem does not go away or is severe. Your mouth may get dry. Chewing sugarless gum or sucking hard candy and drinking plenty of water may help. Contact your care team if the problem does not go away or is severe. What side effects may I notice from receiving this medication? Side effects that you should report to your care team as soon as possible: Allergic reactions--skin rash, itching, hives, swelling of the face, lips, tongue, or throat Bleeding--bloody or black, tar-like stools, red or dark brown urine, vomiting blood or brown material that looks like coffee grounds, small, red or purple spots on skin, unusual bleeding or bruising Heart rhythm changes--fast or irregular heartbeat, dizziness, feeling faint or lightheaded, chest pain, trouble breathing Low blood pressure--dizziness, feeling faint or lightheaded, blurry vision Low sodium level--muscle weakness, fatigue, dizziness, headache, confusion Prolonged or painful erection Serotonin syndrome--irritability, confusion, fast or irregular heartbeat, muscle stiffness, twitching muscles, sweating, high fever, seizures, chills, vomiting, diarrhea Sudden eye pain or change in vision such as blurry vision, seeing halos around lights, vision loss Thoughts of  suicide or self-harm, worsening mood, feelings of depression Side effects that usually do not require medical attention (report to your care team if they continue or are bothersome): Change in sex drive or performance Constipation Dizziness Drowsiness Dry mouth This list may not describe all possible side effects. Call your doctor for medical advice about side effects. You may report side effects to FDA at 1-800-FDA-1088. Where should I keep my  medication? Keep out of the reach of children and pets. Store at room temperature between 15 and 30 degrees C (59 to 86 degrees F). Protect from light. Keep container tightly closed. Throw away any unused medication after the expiration date. NOTE: This sheet is a summary. It may not cover all possible information. If you have questions about this medicine, talk to your doctor, pharmacist, or health care provider.  2024 Elsevier/Gold Standard (2022-03-07 00:00:00)

## 2023-03-10 NOTE — Progress Notes (Signed)
Complete Physical  Assessment and Plan:  Encounter for Annual Physical Exam with abnormal findings Due annually  Health maintenance reviewed Healthily lifestyle goals set  Elevated BP without dx of htn/Labile Well controlled today Discussed DASH (Dietary Approaches to Stop Hypertension) DASH diet is lower in sodium than a typical American diet. Cut back on foods that are high in saturated fat, cholesterol, and trans fats. Eat more whole-grain foods, fish, poultry, and nuts Remain active and exercise as tolerated daily.  Monitor BP at home-Call if greater than 130/80.  Check CMP/CBC   Hyperlipidemia LDL goal <100 Discussed lifestyle modifications. Recommended diet heavy in fruits and veggies, omega 3's. Decrease consumption of animal meats, cheeses, and dairy products. Remain active and exercise as tolerated. Continue to monitor. Check lipids/TSH  Vitamin D deficiency Continue supplement for goal of 60-100 Monitor Vitamin D levels  Medication management All medications discussed and reviewed in full. All questions and concerns regarding medications addressed.    Barrett's esophagus with dysplasia/GERD Controlled, continue the same medications, follow up Dr. Theron Arista - has scheduled  Continue Prilosec PRN. Lifestyle modification:  wt loss, avoid meals 2-3h before bedtime. Consider eliminating food triggers:  chocolate, caffeine, EtOH, acid/spicy food.  History of dysplastic nevus Protect skin from UV exposure; monitor closely   Continue fu derm - UTD 2024 Negative    Depression, recurrent (HCC)/Anxiety Increase Wellbutrin to BID  Reviewed relaxation techniques.  Sleep hygiene. Recommended mindfulness meditation and exercise.   Encouraged personality growth wand development through coping techniques and problem-solving skills. Limit/Decrease/Monitor drug/alcohol intake.    Stress headaches  Will treat mood, stress management discussed discussed PT, massage, heat  application Avoid triggers and address at the source  Continue Fioricet   Insomnia Start Trazodone/Xanax PRN Discussed good sleep hygiene. Establish bed and wake times. Sleep restriction-only sleep estimated hrs sleep. Bed only for sex and sleep, only sleep when sleepy, out of bed if anxious (stimulus control). Reviewed relaxation techniques, mindful meditations. Expected sleep duration. Addressed worries about not sleeping.    Environmental allergies Allegra OTC, increase H20, allergy hygiene explained.  Screening for blood or protein in urine - Urinalysis, Routine w reflex microscopic (not at [facility]) - Microalbumin / creatinine urine ratio  BMI 22 Discussed appropriate BMI Diet modification. Physical activity. Encouraged/praised to build confidence.  Screening for thyroid disorder Monitor TSH  Screening for DM Monitor A1c  Screening for Cardiovascular condition EKG  Screening for deficiency anemia Monitor B12  Hot flashes Start Black Cohosh  Continue Estradiol for now - follow with GYN Continue to monitor  Orders Placed This Encounter  Procedures   CBC with Differential/Platelet   COMPLETE METABOLIC PANEL WITH GFR   Magnesium   Lipid panel   TSH   Hemoglobin A1c   Insulin, random   VITAMIN D 25 Hydroxy (Vit-D Deficiency, Fractures)   Urinalysis, Routine w reflex microscopic   Microalbumin / creatinine urine ratio   Vitamin B12   EKG 12-Lead   Meds ordered this encounter  Medications   tretinoin (RETIN-A) 0.1 % cream    Sig: Apply topically at bedtime.    Dispense:  45 g    Refill:  0    Supervising Provider:   Lucky Cowboy [phone]   butalbital-acetaminophen-caffeine (FIORICET) 50-325-40 MG tablet    Sig: Take 1 tablet by mouth every 6 (six) hours as needed for headache. Limit to 2 doses/week.    Dispense:  30 tablet    Refill:  0    Supervising Provider:   Oneta Rack,  WILLIAM [[id number]]   buPROPion (WELLBUTRIN XL) 150 MG 24 hr tablet    Sig: Take 1  tablet (150 mg total) by mouth 2 (two) times daily.    Dispense:  60 tablet    Refill:  3    Supervising Provider:   Lucky Cowboy [phone]   amphetamine-dextroamphetamine (ADDERALL) 20 MG tablet    Sig: Take   1/2-1 tablet   2 x / day  as needed for ADD, Focus & Concentration                                                         /                                                                   TAKE                                         BY                                                 MOUTH    Dispense:  180 tablet    Refill:  0    Supervising Provider:   Lucky Cowboy [phone]    Notify office for further evaluation and treatment, questions or concerns if any reported s/s fail to improve.   The patient was advised to call back or seek an in-person evaluation if any symptoms worsen or if the condition fails to improve as anticipated.   Further disposition pending results of labs. Discussed med's effects and SE's.    I discussed the assessment and treatment plan with the patient. The patient was provided an opportunity to ask questions and all were answered. The patient agreed with the plan and demonstrated an understanding of the instructions.  Discussed med's effects and SE's. Screening labs and tests as requested with regular follow-up as recommended.  I provided 30 minutes of face-to-face time during this encounter including counseling, chart review, and critical decision making was preformed.  Today's Plan of Care is based on a patient-centered health care approach known as shared decision making - the decisions, tests and treatments allow for patient preferences and values to be balanced with clinical evidence.    Future Appointments  Date Time Provider Department Center  03/10/2024  2:00 PM Adela Glimpse, NP [facility] None     HPI  62 y.o. female  presents for a complete physical. She has Environmental allergies; Anxiety; Depression, recurrent (HCC); Stress  headaches; Hyperlipidemia LDL goal <100; History of dysplastic nevus; Vitamin D deficiency; Gastroesophageal reflux disease with esophagitis; Barrett's esophagus; DDD (degenerative disc disease), cervical; Right elbow pain; Elevated BP without diagnosis of hypertension; Osteopenia; B12 deficiency; Attention deficit disorder (ADD) without hyperactivity; Trochanteric bursitis; and Anemia on their problem list.   She is married, 2 daughters, 2 grandchildren and another on  the way.   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 Lots of stress both daughters having family issues, worries about this a lot.  Has caused increase in anxiety depression and insomnia.  Unable to sleep due to excessive worry.  Currently taking Wellbutrin but feels this could be tweaked for improvement.  Stress has triggered HA.  Takes Fiorcet which is effective.  She is followed by GYN Dr. Rana Snare; last in 2018; She has had TAH 1 ovary spared, and has osteopenia, on estradiol 2 mg  for osteopenia as well as hot flashes but feels hot flashes are no longer controlled, on bASA.  Mammo 05/17/2021 negative at breast center. She is agreeable to GYN follow up this year.   Hx of recurrent depression, recently has remained on wellbutin XR 150 mg with good results. She has hx of ADD, prescribed adderall 20 mg IR, report takes 1/2 tab adderall once daily, rarely second tab in hour if running late, takes Mon-Fri without SE. Has xanax but rarely uses during the day, typically takes to sleep at night, ongoing discussions to limit use. Has tried gabapentin before and had too much fogginess.   She has headaches associated with stress/neck pain; uses Excedrin migraine; admits occasional goody powder use. Occasionally fioricet.  She has neck pain, arthritis, has seen neuro for shots, hasn't been recently.   Has hx of GERD with Barrett's esophagus, hx of ulcers, followed by Dr. Theron Arista. She is on dexilant 60 mg daily.   She has history of  dysplastic nevus, and follows with Dr. Nicholas Lose.   BMI is Body mass index is 22.27 kg/m., she has been working on diet and exercise. Wt Readings from Last 3 Encounters:  03/10/23 131 lb 12.8 oz (59.8 kg)  02/25/23 129 lb (58.5 kg)  02/28/22 135 lb (61.2 kg)   Her blood pressure has been controlled at home, today their BP is BP: 122/78 She does workout, walks daily. She denies chest pain, shortness of breath, dizziness.   She had CTA in 2017 which showed normal caliber arteries and no atherosclerosis  She is not on cholesterol medication controlled via lifestyle. Her cholesterol is not at goal. The cholesterol last visit was:   Lab Results  Component Value Date   CHOL 206 (H) 02/28/2022   HDL 85 02/28/2022   LDLCALC 104 (H) 02/28/2022   TRIG 76 02/28/2022   CHOLHDL 2.4 02/28/2022   Last Z6X in the office was:  Lab Results  Component Value Date   HGBA1C 5.0 08/29/2021   Lab Results  Component Value Date   EGFR 103 02/28/2022   Patient is not currently on Vitamin D supplement.  Lab Results  Component Value Date   VD25OH 30 08/29/2021     She is not currently on B12 supplement -  Lab Results  Component Value Date   VITAMINB12 274 08/29/2021     Current Medications:  Current Outpatient Medications on File Prior to Visit  Medication Sig Dispense Refill   Adapalene-Benzoyl Peroxide (EPIDUO) 0.1-2.5 % gel APPLY A SMALL AMOUNT AT BEDTIME AS NEEDED 45 g 3   ALPRAZolam (XANAX) 0.5 MG tablet Take 1/2 to 1 tablet 2 to 3 x / day ONLY if needed for Anxiety Attack or Sleep  & please try to limit to 5 days / week to avoid addiction 60 tablet 0   amphetamine-dextroamphetamine (ADDERALL) 20 MG tablet Take   1/2-1 tablet   2 x / day  as needed for ADD,  Focus & Concentration                                                         /                                                                   TAKE                                         BY                                                  MOUTH 180 tablet 0   buPROPion (WELLBUTRIN XL) 150 MG 24 hr tablet TAKE 1 TABLET BY MOUTH DAILY FOR MOOD OR FOCUS OR CONCENTRATION 90 tablet 3   butalbital-acetaminophen-caffeine (FIORICET) 50-325-40 MG tablet Take 1 tablet by mouth every 6 (six) hours as needed for headache. Limit to 2 doses/week. 30 tablet 0   denosumab (PROLIA) 60 MG/ML SOSY injection Inject 60 mg into the skin every 6 (six) months.     estradiol (ESTRACE) 2 MG tablet TAKE 1 TABLET(2 MG) BY MOUTH DAILY 90 tablet 0   ibuprofen (ADVIL,MOTRIN) 400 MG tablet Take 1 tablet (400 mg total) by mouth every 6 (six) hours as needed. 30 tablet 0   meclizine (ANTIVERT) 50 MG tablet Take 1 tablet (50 mg total) by mouth daily. 30 tablet 1   tretinoin (RETIN-A) 0.1 % cream Apply topically at bedtime. 45 g 0   azithromycin (ZITHROMAX) 250 MG tablet Take 2 tablets (500 mg) on  Day 1,  followed by 1 tablet (250 mg) once daily on Days 2 through 5. 6 each 1   omeprazole (PRILOSEC) 40 MG capsule Take 40 mg by mouth daily. (Patient not taking: Reported on 03/10/2023)     No current facility-administered medications on file prior to visit.   Allergies:  Allergies  Allergen Reactions   Brintellix [Vortioxetine] Nausea Only   Medical History:  Shehas Environmental allergies; Anxiety; Depression, recurrent (HCC); Stress headaches; Hyperlipidemia LDL goal <100; History of dysplastic nevus; Vitamin D deficiency; Gastroesophageal reflux disease with esophagitis; Barrett's esophagus; DDD (degenerative disc disease), cervical; Right elbow pain; Elevated BP without diagnosis of hypertension; Osteopenia; B12 deficiency; Attention deficit disorder (ADD) without hyperactivity; Trochanteric bursitis; and Anemia on their problem list.   Health Maintenance:   Immunization History  Administered Date(s) Administered   Influenza Inj Mdck Quad With Preservative 02/24/2020   Influenza Split 12/25/2012, 12/20/2014   Influenza,inj,Quad PF,6+ Mos 02/04/2019    Influenza-Unspecified 12/23/2016   PFIZER(Purple Top)SARS-COV-2 Vaccination 08/03/2019, 08/24/2019   Tdap 09/04/2012   Health Maintenance  Topic Date Due   Zoster Vaccines- Shingrix (1 of 2) Never done   Cervical Cancer Screening (HPV/Pap Cotest)  Never done   COVID-19 Vaccine (3 - Pfizer risk series) 09/21/2019   DTaP/Tdap/Td (2 -  Td or Tdap) 09/05/2022   INFLUENZA VACCINE  10/24/2022   MAMMOGRAM  05/18/2023   Colonoscopy  01/02/2025   HPV VACCINES  Aged Out   Hepatitis C Screening  Discontinued   HIV Screening  Discontinued   Shingrix: discussed, wants to hold off Covid 19: 2/2, pfizer, will send card  LMP TAH, cervix removed  Pap: 2018 Dr. Rana Snare, will schedule this year MGM: 04/2021, breast center - plans to schedule  DEXA: 2018 osteopenia Dr. Rana Snare but wants to get next at breast center - order placed  Colonoscopy: Aug 2016 with Dr. Noe Gens, polyps, [facility] [place], 5 year recall, overdue, has appointment tomorrow  EGD: 03/2018, + gastritis/ulcer still, - on dexilant, 2 year follow up, has follow up tomorrow   Vision: Dr. Jimmey Ralph, [facility], 12/2018, overdue - has next month  Dental: Dr. Jon Billings, last 2024, goes q73m  Derm: Full body check 09/2022 - no issues   Patient Care Team: Lucky Cowboy, MD as PCP - General (Internal Medicine) Earl Gala, MD as Referring Physician (Orthopedic Surgery) Marcene Corning, MD as Consulting Physician (Orthopedic Surgery) Arlice Colt, MD (Unknown Physician Specialty)  Surgical History:  She  has a past surgical history that includes Abdominal hysterectomy (2004); Cesarean section; Bladder suspension; and Tonsillectomy. Family History:  She had a family history includes Asthma in her paternal grandmother; COPD in her maternal grandmother; Heart disease in her father and paternal grandfather; Hyperlipidemia in her father; Hypertension in her father and mother; Pulmonary embolism in her maternal grandmother. Social History:  She   reports that she has never smoked. She has been exposed to tobacco smoke. She has never used smokeless tobacco. She reports current alcohol use of about 3.0 standard drinks of alcohol per week. She reports that she does not use drugs.  Review of Systems: Review of Systems  Constitutional: Negative.  Negative for malaise/fatigue and weight loss.  HENT: Negative.  Negative for hearing loss and tinnitus.   Eyes: Negative.  Negative for blurred vision and double vision.  Respiratory: Negative.  Negative for cough, shortness of breath and wheezing.   Cardiovascular: Negative.  Negative for chest pain, palpitations, orthopnea, claudication and leg swelling.  Gastrointestinal:  Positive for heartburn (intermittent, after trigger foods, takes tums). Negative for abdominal pain, blood in stool, constipation, diarrhea, melena, nausea and vomiting.  Genitourinary: Negative.  Negative for dysuria, flank pain, frequency, hematuria and urgency.  Musculoskeletal:  Positive for neck pain (at night, some intermittent right radicular). Negative for joint pain and myalgias.  Skin: Negative.  Negative for rash.  Neurological:  Positive for headaches (improved, a few/week). Negative for dizziness, tingling, sensory change and weakness.  Endo/Heme/Allergies: Negative.  Negative for polydipsia.  Psychiatric/Behavioral:  Negative for depression, hallucinations, memory loss, substance abuse and suicidal ideas. The patient is not nervous/anxious and does not have insomnia.   All other systems reviewed and are negative.   Physical Exam: Estimated body mass index is 22.27 kg/m as calculated from the following:   Height as of this encounter: 5' 4.5" (1.638 m).   Weight as of this encounter: 131 lb 12.8 oz (59.8 kg). BP 122/78   Pulse 80   Temp 98 F (36.7 C)   Ht 5' 4.5" (1.638 m)   Wt 131 lb 12.8 oz (59.8 kg)   SpO2 99%   BMI 22.27 kg/m  General Appearance: Well nourished, in no apparent distress.  Eyes:  PERRLA, EOMs, conjunctiva no swelling or erythema  Sinuses: No Frontal/maxillary tenderness  ENT/Mouth:  Ext aud canals clear, normal light reflex with TMs without erythema, bulging. Good dentition. No erythema, swelling, or exudate on post pharynx. Tonsils not swollen or erythematous. Hearing normal. + TMJ Neck: Supple, thyroid normal. No bruits  Respiratory: Respiratory effort normal, BS equal bilaterally without rales, rhonchi, wheezing or stridor.  Cardio: RRR without murmurs, rubs or gallops. Brisk peripheral pulses without edema.  Chest: symmetric, with normal excursions and percussion.  Breasts: defer to GYN Abdomen: Soft, nontender, no guarding, rebound, hernias, masses, or organomegaly.  Lymphatics: Non tender without lymphadenopathy.  Genitourinary: defer to GYN Musculoskeletal: Full ROM all peripheral extremities,5/5 strength, and normal gait., normal distal neuro. + left lateral hip tenderness over greater trochanter Skin: Warm, dry without rashes, lesions, ecchymosis. Neuro: Cranial nerves intact, reflexes equal bilaterally. Normal muscle tone, no cerebellar symptoms. Sensation intact.  Psych: Awake and oriented X 3, normal affect, Insight and Judgment appropriate.   EKG: NSR  Nikolina Simerson, NP-C 4:21 PM [facility]

## 2023-03-11 ENCOUNTER — Encounter: Payer: Self-pay | Admitting: Nurse Practitioner

## 2023-03-11 ENCOUNTER — Other Ambulatory Visit: Payer: Self-pay | Admitting: Nurse Practitioner

## 2023-03-11 DIAGNOSIS — D649 Anemia, unspecified: Secondary | ICD-10-CM

## 2023-03-11 DIAGNOSIS — F419 Anxiety disorder, unspecified: Secondary | ICD-10-CM

## 2023-03-11 MED ORDER — ALPRAZOLAM 0.5 MG PO TABS: ORAL_TABLET | ORAL | 0 refills | Status: AC

## 2023-03-12 ENCOUNTER — Encounter: Payer: Self-pay | Admitting: Nurse Practitioner

## 2023-03-12 LAB — COMPLETE METABOLIC PANEL WITH GFR
AG Ratio: 1.6 (calc) (ref 1.0–2.5)
ALT: 10 U/L (ref 6–29)
AST: 17 U/L (ref 10–35)
Albumin: 4.1 g/dL (ref 3.6–5.1)
Alkaline phosphatase (APISO): 44 U/L (ref 37–153)
BUN: 20 mg/dL (ref 7–25)
CO2: 32 mmol/L (ref 20–32)
Calcium: 9.2 mg/dL (ref 8.6–10.4)
Chloride: 107 mmol/L (ref 98–110)
Creat: 0.76 mg/dL (ref 0.50–1.05)
Globulin: 2.6 g/dL (ref 1.9–3.7)
Glucose, Bld: 93 mg/dL (ref 65–99)
Potassium: 3.8 mmol/L (ref 3.5–5.3)
Sodium: 144 mmol/L (ref 135–146)
Total Bilirubin: 0.6 mg/dL (ref 0.2–1.2)
Total Protein: 6.7 g/dL (ref 6.1–8.1)
eGFR: 90 mL/min/{1.73_m2} (ref >=60)

## 2023-03-12 LAB — CBC WITH DIFFERENTIAL/PLATELET
Absolute Lymphocytes: 2210 {cells}/uL (ref 850–3900)
Absolute Monocytes: 683 {cells}/uL (ref 200–950)
Basophils Absolute: 39 {cells}/uL (ref 0–200)
Basophils Relative: 0.6 %
Eosinophils Absolute: 130 {cells}/uL (ref 15–500)
Eosinophils Relative: 2 %
HCT: 34.6 % — ABNORMAL LOW (ref 35.0–45.0)
Hemoglobin: 11.6 g/dL — ABNORMAL LOW (ref 11.7–15.5)
MCH: 33.1 pg — ABNORMAL HIGH (ref 27.0–33.0)
MCHC: 33.5 g/dL (ref 32.0–36.0)
MCV: 98.9 fL (ref 80.0–100.0)
MPV: 9.4 fL (ref 7.5–12.5)
Monocytes Relative: 10.5 %
Neutro Abs: 3439 {cells}/uL (ref 1500–7800)
Neutrophils Relative %: 52.9 %
Platelets: 285 10*3/uL (ref 140–400)
RBC: 3.5 10*6/uL — ABNORMAL LOW (ref 3.80–5.10)
RDW: 12.3 % (ref 11.0–15.0)
Total Lymphocyte: 34 %
WBC: 6.5 10*3/uL (ref 3.8–10.8)

## 2023-03-12 LAB — HEMOGLOBIN A1C
Hgb A1c MFr Bld: 5.1 %{Hb} (ref <5.7)
Mean Plasma Glucose: 100 mg/dL
eAG (mmol/L): 5.5 mmol/L

## 2023-03-12 LAB — TSH: TSH: 0.66 m[IU]/L (ref 0.40–4.50)

## 2023-03-12 LAB — URINALYSIS, ROUTINE W REFLEX MICROSCOPIC
Bilirubin Urine: NEGATIVE
Glucose, UA: NEGATIVE
Hgb urine dipstick: NEGATIVE
Leukocytes,Ua: NEGATIVE
Nitrite: NEGATIVE
Protein, ur: NEGATIVE
Specific Gravity, Urine: 1.027 (ref 1.001–1.035)
pH: 6 (ref 5.0–8.0)

## 2023-03-12 LAB — LIPID PANEL
Cholesterol: 208 mg/dL — ABNORMAL HIGH (ref <200)
HDL: 84 mg/dL (ref >=50)
LDL Cholesterol (Calc): 101 mg/dL — ABNORMAL HIGH
Non-HDL Cholesterol (Calc): 124 mg/dL (ref <130)
Total CHOL/HDL Ratio: 2.5 (calc) (ref <5.0)
Triglycerides: 126 mg/dL (ref <150)

## 2023-03-12 LAB — TEST AUTHORIZATION

## 2023-03-12 LAB — IRON,TIBC AND FERRITIN PANEL
%SAT: 44 % (ref 16–45)
Ferritin: 20 ng/mL (ref 16–232)
Iron: 140 ug/dL (ref 45–160)
TIBC: 320 ug/dL (ref 250–450)

## 2023-03-12 LAB — MICROALBUMIN / CREATININE URINE RATIO
Creatinine, Urine: 163 mg/dL (ref 20–275)
Microalb Creat Ratio: 13 mg/g{creat} (ref <30)
Microalb, Ur: 2.1 mg/dL

## 2023-03-12 LAB — VITAMIN B12: Vitamin B-12: 326 pg/mL (ref 200–1100)

## 2023-03-12 LAB — MAGNESIUM: Magnesium: 2.1 mg/dL (ref 1.5–2.5)

## 2023-03-12 LAB — VITAMIN D 25 HYDROXY (VIT D DEFICIENCY, FRACTURES): Vit D, 25-Hydroxy: 26 ng/mL — ABNORMAL LOW (ref 30–100)

## 2023-03-12 LAB — INSULIN, RANDOM: Insulin: 9.3 u[IU]/mL

## 2023-03-12 MED ORDER — TRAZODONE HCL 50 MG PO TABS: 50.0000 mg | ORAL_TABLET | Freq: Every day | ORAL | 1 refills | Status: AC

## 2023-04-22 ENCOUNTER — Encounter: Payer: Self-pay | Admitting: Nurse Practitioner

## 2023-04-23 ENCOUNTER — Other Ambulatory Visit: Payer: Self-pay | Admitting: Adult Health

## 2023-04-23 DIAGNOSIS — Z0000 Encounter for general adult medical examination without abnormal findings: Secondary | ICD-10-CM

## 2023-04-23 MED ORDER — TRETINOIN 0.025 % EX GEL: Freq: Every day | CUTANEOUS | 0 refills | Status: DC

## 2023-04-24 ENCOUNTER — Other Ambulatory Visit: Payer: Self-pay | Admitting: Nurse Practitioner

## 2023-04-24 MED ORDER — TRETINOIN 0.025 % EX GEL: Freq: Every day | CUTANEOUS | 0 refills | Status: AC

## 2023-05-01 ENCOUNTER — Ambulatory Visit
Admission: RE | Admit: 2023-05-01 | Discharge: 2023-05-01 | Disposition: A | Discharge status: Home / Self Care | Payer: BC Managed Care – PPO | Source: Ambulatory Visit | Attending: Adult Health | Admitting: Adult Health

## 2023-05-01 DIAGNOSIS — Z0000 Encounter for general adult medical examination without abnormal findings: Secondary | ICD-10-CM

## 2023-05-01 DIAGNOSIS — Z1231 Encounter for screening mammogram for malignant neoplasm of breast: Secondary | ICD-10-CM | POA: Diagnosis not present

## 2023-05-06 ENCOUNTER — Other Ambulatory Visit: Payer: Self-pay | Admitting: Adult Health

## 2023-05-06 DIAGNOSIS — R928 Other abnormal and inconclusive findings on diagnostic imaging of breast: Secondary | ICD-10-CM

## 2023-05-09 ENCOUNTER — Encounter: Payer: Self-pay | Admitting: Adult Health

## 2023-05-14 ENCOUNTER — Other Ambulatory Visit: Payer: Self-pay | Admitting: Nurse Practitioner

## 2023-05-14 DIAGNOSIS — R928 Other abnormal and inconclusive findings on diagnostic imaging of breast: Secondary | ICD-10-CM

## 2023-05-15 ENCOUNTER — Other Ambulatory Visit: Payer: BC Managed Care – PPO

## 2023-05-19 ENCOUNTER — Encounter: Payer: Self-pay | Admitting: Nurse Practitioner

## 2023-05-20 ENCOUNTER — Other Ambulatory Visit: Payer: Self-pay | Admitting: Family

## 2023-05-20 DIAGNOSIS — R928 Other abnormal and inconclusive findings on diagnostic imaging of breast: Secondary | ICD-10-CM

## 2023-05-22 ENCOUNTER — Ambulatory Visit
Admission: RE | Admit: 2023-05-22 | Discharge: 2023-05-22 | Disposition: A | Discharge status: Home / Self Care | Payer: BC Managed Care – PPO | Source: Ambulatory Visit | Attending: Nurse Practitioner | Admitting: Nurse Practitioner

## 2023-05-22 DIAGNOSIS — R928 Other abnormal and inconclusive findings on diagnostic imaging of breast: Secondary | ICD-10-CM

## 2023-07-15 DIAGNOSIS — Z79899 Other long term (current) drug therapy: Secondary | ICD-10-CM | POA: Diagnosis not present

## 2023-07-15 DIAGNOSIS — K2270 Barrett's esophagus without dysplasia: Secondary | ICD-10-CM | POA: Diagnosis not present

## 2023-07-15 DIAGNOSIS — M810 Age-related osteoporosis without current pathological fracture: Secondary | ICD-10-CM | POA: Diagnosis not present

## 2023-07-15 DIAGNOSIS — M2550 Pain in unspecified joint: Secondary | ICD-10-CM | POA: Diagnosis not present

## 2023-08-04 DIAGNOSIS — M810 Age-related osteoporosis without current pathological fracture: Secondary | ICD-10-CM | POA: Diagnosis not present

## 2023-08-07 DIAGNOSIS — M25561 Pain in right knee: Secondary | ICD-10-CM | POA: Diagnosis not present

## 2023-08-08 DIAGNOSIS — M1711 Unilateral primary osteoarthritis, right knee: Secondary | ICD-10-CM | POA: Diagnosis not present

## 2023-09-01 ENCOUNTER — Encounter: Payer: BC Managed Care – PPO | Admitting: Nurse Practitioner

## 2023-09-24 DIAGNOSIS — G43909 Migraine, unspecified, not intractable, without status migrainosus: Secondary | ICD-10-CM | POA: Diagnosis not present

## 2023-09-24 DIAGNOSIS — F418 Other specified anxiety disorders: Secondary | ICD-10-CM | POA: Diagnosis not present

## 2023-09-24 DIAGNOSIS — Z780 Asymptomatic menopausal state: Secondary | ICD-10-CM | POA: Diagnosis not present

## 2023-09-24 DIAGNOSIS — R41840 Attention and concentration deficit: Secondary | ICD-10-CM | POA: Diagnosis not present

## 2024-01-14 DIAGNOSIS — M25561 Pain in right knee: Secondary | ICD-10-CM | POA: Diagnosis not present

## 2024-01-14 DIAGNOSIS — M810 Age-related osteoporosis without current pathological fracture: Secondary | ICD-10-CM | POA: Diagnosis not present

## 2024-01-14 DIAGNOSIS — Z79899 Other long term (current) drug therapy: Secondary | ICD-10-CM | POA: Diagnosis not present

## 2024-01-14 DIAGNOSIS — M2550 Pain in unspecified joint: Secondary | ICD-10-CM | POA: Diagnosis not present

## 2024-02-05 DIAGNOSIS — M810 Age-related osteoporosis without current pathological fracture: Secondary | ICD-10-CM | POA: Diagnosis not present

## 2024-03-10 ENCOUNTER — Other Ambulatory Visit: Payer: Self-pay | Admitting: Nurse Practitioner

## 2024-03-10 ENCOUNTER — Encounter: Payer: BC Managed Care – PPO | Admitting: Nurse Practitioner

## 2024-03-10 DIAGNOSIS — J0100 Acute maxillary sinusitis, unspecified: Secondary | ICD-10-CM

## 2024-03-24 DIAGNOSIS — R946 Abnormal results of thyroid function studies: Secondary | ICD-10-CM | POA: Diagnosis not present

## 2024-03-24 DIAGNOSIS — Z79899 Other long term (current) drug therapy: Secondary | ICD-10-CM | POA: Diagnosis not present

## 2024-03-24 DIAGNOSIS — R7309 Other abnormal glucose: Secondary | ICD-10-CM | POA: Diagnosis not present

## 2024-03-24 DIAGNOSIS — Z13220 Encounter for screening for lipoid disorders: Secondary | ICD-10-CM | POA: Diagnosis not present

## 2024-03-24 DIAGNOSIS — E559 Vitamin D deficiency, unspecified: Secondary | ICD-10-CM | POA: Diagnosis not present

## 2024-03-24 DIAGNOSIS — M810 Age-related osteoporosis without current pathological fracture: Secondary | ICD-10-CM | POA: Diagnosis not present
